# Patient Record
Sex: Male | Born: 1955
Health system: Southern US, Community
[De-identification: ages and names within clinical notes are randomized; demographics above are authoritative.]

## PROBLEM LIST (undated history)

## (undated) DIAGNOSIS — E162 Hypoglycemia, unspecified: Secondary | ICD-10-CM

## (undated) DIAGNOSIS — L509 Urticaria, unspecified: Secondary | ICD-10-CM

## (undated) DIAGNOSIS — I10 Essential (primary) hypertension: Secondary | ICD-10-CM

## (undated) DIAGNOSIS — F32A Depression, unspecified: Secondary | ICD-10-CM

## (undated) DIAGNOSIS — M1990 Unspecified osteoarthritis, unspecified site: Secondary | ICD-10-CM

## (undated) DIAGNOSIS — G709 Myoneural disorder, unspecified: Secondary | ICD-10-CM

## (undated) DIAGNOSIS — E785 Hyperlipidemia, unspecified: Secondary | ICD-10-CM

## (undated) DIAGNOSIS — K219 Gastro-esophageal reflux disease without esophagitis: Secondary | ICD-10-CM

## (undated) DIAGNOSIS — F419 Anxiety disorder, unspecified: Secondary | ICD-10-CM

## (undated) HISTORY — DX: Urticaria, unspecified: L50.9

## (undated) HISTORY — PX: TONSILLECTOMY AND ADENOIDECTOMY: SUR1326

## (undated) HISTORY — PX: SIGMOIDOSCOPY: SUR1295

## (undated) HISTORY — PX: HERNIA REPAIR: SHX51

## (undated) HISTORY — DX: Hyperlipidemia, unspecified: E78.5

## (undated) HISTORY — DX: Anxiety disorder, unspecified: F41.9

## (undated) HISTORY — PX: CARDIAC CATHETERIZATION: SHX172

## (undated) HISTORY — DX: Essential (primary) hypertension: I10

## (undated) HISTORY — DX: Gastro-esophageal reflux disease without esophagitis: K21.9

---

## 2004-06-16 HISTORY — PX: GLAUCOMA REPAIR: SHX214

## 2006-02-12 ENCOUNTER — Encounter: Admission: RE | Admit: 2006-02-12 | Discharge: 2006-02-12 | Payer: Self-pay | Admitting: Family Medicine

## 2006-11-02 ENCOUNTER — Emergency Department (HOSPITAL_COMMUNITY): Admission: EM | Admit: 2006-11-02 | Discharge: 2006-11-02 | Payer: Self-pay | Admitting: Emergency Medicine

## 2007-08-22 ENCOUNTER — Encounter: Admission: RE | Admit: 2007-08-22 | Discharge: 2007-08-22 | Payer: Self-pay | Admitting: Family Medicine

## 2010-07-07 ENCOUNTER — Encounter: Payer: Self-pay | Admitting: Family Medicine

## 2011-10-16 ENCOUNTER — Encounter (INDEPENDENT_AMBULATORY_CARE_PROVIDER_SITE_OTHER): Payer: Self-pay | Admitting: Surgery

## 2011-10-17 ENCOUNTER — Encounter (INDEPENDENT_AMBULATORY_CARE_PROVIDER_SITE_OTHER): Payer: Self-pay | Admitting: Surgery

## 2011-11-07 ENCOUNTER — Encounter (INDEPENDENT_AMBULATORY_CARE_PROVIDER_SITE_OTHER): Payer: Self-pay | Admitting: Surgery

## 2011-11-07 ENCOUNTER — Ambulatory Visit (INDEPENDENT_AMBULATORY_CARE_PROVIDER_SITE_OTHER): Payer: BC Managed Care – PPO | Admitting: Surgery

## 2011-11-07 VITALS — BP 140/72 | HR 92 | Temp 98.8°F | Resp 14 | Ht 68.0 in | Wt 165.4 lb

## 2011-11-07 DIAGNOSIS — K4090 Unilateral inguinal hernia, without obstruction or gangrene, not specified as recurrent: Secondary | ICD-10-CM | POA: Insufficient documentation

## 2011-11-07 NOTE — Patient Instructions (Signed)
Stop your aspirin 5 days before surgery. 

## 2011-11-07 NOTE — Progress Notes (Signed)
Patient ID: Sean Kirby, male   DOB: [birth year], 56 y.o.   MRN: [id number]  Chief Complaint  Patient presents with  . New Evaluation    New Pt.Eval [facility]    HPI Sean Kirby is a 56 y.o. male.  Referred by Dr. Johny Blamer for evaluation of left inguinal hernia. HPI   This is a 56 year old male who presents with a two-week history of some discomfort and bulging in his left groin. This causes some discomfort. He has avoided any heavy lifting. He denies any obstructive symptoms. He presents now for surgical evaluation. Past Medical History  Diagnosis Date  . Hypertension   . Hyperlipidemia   . Anxiety   . Diabetes mellitus   . Glaucoma   . GERD (gastroesophageal reflux disease)     Past Surgical History  Procedure Date  . Tonsillectomy and adenoidectomy   . Glaucoma repair 2006    Family History  Problem Relation Age of Onset  . Heart disease Mother   . Heart disease Maternal Aunt   . Heart disease Maternal Grandmother     Social History History  Substance Use Topics  . Smoking status: Never Smoker   . Smokeless tobacco: Never Used  . Alcohol Use: No    Allergies  Allergen Reactions  . Codeine   . Septra (Sulfamethoxazole W-Trimethoprim)   . Glimepiride   . Metformin And Related   . Simvastatin Diarrhea    Current Outpatient Prescriptions  Medication Sig Dispense Refill  . amLODipine (NORVASC) 10 MG tablet Take 10 mg by mouth daily.      Sean Kirby Kitchen aspirin 325 MG buffered tablet Take 325 mg by mouth as needed.      Sean Kirby Kitchen glucose blood test strip 1 each by Other route as needed. Use as instructed      . LORazepam (ATIVAN) 0.5 MG tablet Take 0.5 mg by mouth every 8 (eight) hours.      . pravastatin (PRAVACHOL) 20 MG tablet Take 20 mg by mouth daily.      . quinapril-hydrochlorothiazide (ACCURETIC) 20-12.5 MG per tablet Take 1 tablet by mouth daily.        Review of Systems Review of Systems  Constitutional: Negative for fever, chills and unexpected weight  change.  HENT: Negative for hearing loss, congestion, sore throat, trouble swallowing and voice change.   Eyes: Negative for visual disturbance.  Respiratory: Negative for cough and wheezing.   Cardiovascular: Negative for chest pain, palpitations and leg swelling.  Gastrointestinal: Negative for nausea, vomiting, abdominal pain, diarrhea, constipation, blood in stool, abdominal distention, anal bleeding and rectal pain.  Genitourinary: Negative for hematuria and difficulty urinating.  Musculoskeletal: Negative for arthralgias.  Skin: Negative for rash and wound.  Neurological: Negative for seizures, syncope, weakness and headaches.  Hematological: Negative for adenopathy. Does not bruise/bleed easily.  Psychiatric/Behavioral: Negative for confusion.    Blood pressure 140/72, pulse 92, temperature 98.8 F (37.1 C), temperature source Temporal, resp. rate 14, height 5\' 8"  (1.727 m), weight 165 lb 6.4 oz (75.025 kg).  Physical Exam Physical Exam WDWN in NAD HEENT:  EOMI, sclera anicteric Neck:  No masses, no thyromegaly Lungs:  CTA bilaterally; normal respiratory effort CV:  Regular rate and rhythm; no murmurs Abd:  +bowel sounds, soft, non-tender, no masses GU; bilateral descended testes; no testicular masses; reducible left inguinal hernia; no sign of right inguinal hernia Ext:  Well-perfused; no edema Skin:  Warm, dry; no sign of jaundice  Data Reviewed none  Assessment  Left inguinal hernia     Plan    Left inguinal hernia repair with mesh.  The surgical procedure has been discussed with the patient.  Potential risks, benefits, alternative treatments, and expected outcomes have been explained.  All of the patient's questions at this time have been answered.  The likelihood of reaching the patient's treatment goal is good.  The patient understand the proposed surgical procedure and wishes to proceed.        Roshun Klingensmith K. 11/07/2011, 6:05 PM

## 2011-11-27 ENCOUNTER — Encounter (HOSPITAL_BASED_OUTPATIENT_CLINIC_OR_DEPARTMENT_OTHER): Payer: Self-pay | Admitting: *Deleted

## 2011-11-27 NOTE — Progress Notes (Signed)
To come in for [id number]  

## 2011-11-28 ENCOUNTER — Encounter (HOSPITAL_BASED_OUTPATIENT_CLINIC_OR_DEPARTMENT_OTHER)
Admission: RE | Admit: 2011-11-28 | Discharge: 2011-11-28 | Disposition: A | Discharge status: Home / Self Care | Payer: BC Managed Care – PPO | Source: Ambulatory Visit | Attending: Surgery | Admitting: Surgery

## 2011-11-28 LAB — BASIC METABOLIC PANEL
BUN: 18 mg/dL (ref 6–23)
CO2: 27 mEq/L (ref 19–32)
Calcium: 9.8 mg/dL (ref 8.4–10.5)
Chloride: 101 mEq/L (ref 96–112)
Creatinine, Ser: 1.07 mg/dL (ref 0.50–1.35)
GFR calc Af Amer: 87 mL/min — ABNORMAL LOW (ref >90)
GFR calc non Af Amer: 75 mL/min — ABNORMAL LOW (ref >90)
Glucose, Bld: 273 mg/dL — ABNORMAL HIGH (ref 70–99)
Potassium: 4 mEq/L (ref 3.5–5.1)
Sodium: 139 mEq/L (ref 135–145)

## 2011-12-02 ENCOUNTER — Encounter (HOSPITAL_BASED_OUTPATIENT_CLINIC_OR_DEPARTMENT_OTHER)
Admission: RE | Disposition: A | Discharge status: Home / Self Care | Payer: Self-pay | Source: Ambulatory Visit | Attending: Surgery

## 2011-12-02 ENCOUNTER — Encounter (HOSPITAL_BASED_OUTPATIENT_CLINIC_OR_DEPARTMENT_OTHER): Payer: Self-pay | Admitting: Anesthesiology

## 2011-12-02 ENCOUNTER — Ambulatory Visit (HOSPITAL_BASED_OUTPATIENT_CLINIC_OR_DEPARTMENT_OTHER): Payer: BC Managed Care – PPO | Admitting: Anesthesiology

## 2011-12-02 ENCOUNTER — Ambulatory Visit (HOSPITAL_BASED_OUTPATIENT_CLINIC_OR_DEPARTMENT_OTHER)
Admission: RE | Admit: 2011-12-02 | Discharge: 2011-12-02 | Disposition: A | Discharge status: Home / Self Care | Payer: BC Managed Care – PPO | Source: Ambulatory Visit | Attending: Surgery | Admitting: Surgery

## 2011-12-02 ENCOUNTER — Encounter (HOSPITAL_BASED_OUTPATIENT_CLINIC_OR_DEPARTMENT_OTHER): Payer: Self-pay | Admitting: *Deleted

## 2011-12-02 DIAGNOSIS — E785 Hyperlipidemia, unspecified: Secondary | ICD-10-CM | POA: Insufficient documentation

## 2011-12-02 DIAGNOSIS — K4090 Unilateral inguinal hernia, without obstruction or gangrene, not specified as recurrent: Secondary | ICD-10-CM

## 2011-12-02 DIAGNOSIS — K219 Gastro-esophageal reflux disease without esophagitis: Secondary | ICD-10-CM | POA: Insufficient documentation

## 2011-12-02 DIAGNOSIS — M129 Arthropathy, unspecified: Secondary | ICD-10-CM | POA: Insufficient documentation

## 2011-12-02 DIAGNOSIS — F411 Generalized anxiety disorder: Secondary | ICD-10-CM | POA: Insufficient documentation

## 2011-12-02 DIAGNOSIS — E119 Type 2 diabetes mellitus without complications: Secondary | ICD-10-CM | POA: Insufficient documentation

## 2011-12-02 DIAGNOSIS — H409 Unspecified glaucoma: Secondary | ICD-10-CM | POA: Insufficient documentation

## 2011-12-02 DIAGNOSIS — I10 Essential (primary) hypertension: Secondary | ICD-10-CM | POA: Insufficient documentation

## 2011-12-02 DIAGNOSIS — Z01810 Encounter for preprocedural cardiovascular examination: Secondary | ICD-10-CM | POA: Insufficient documentation

## 2011-12-02 HISTORY — DX: Unspecified osteoarthritis, unspecified site: M19.90

## 2011-12-02 HISTORY — PX: INGUINAL HERNIA REPAIR: SHX194

## 2011-12-02 HISTORY — DX: Myoneural disorder, unspecified: G70.9

## 2011-12-02 HISTORY — DX: Hypoglycemia, unspecified: E16.2

## 2011-12-02 LAB — GLUCOSE, CAPILLARY
Glucose-Capillary: 121 mg/dL — ABNORMAL HIGH (ref 70–99)
Glucose-Capillary: 134 mg/dL — ABNORMAL HIGH (ref 70–99)

## 2011-12-02 SURGERY — REPAIR, HERNIA, INGUINAL, ADULT
Anesthesia: General | Site: Abdomen | Laterality: Left | Wound class: Clean

## 2011-12-02 MED ORDER — BUPIVACAINE-EPINEPHRINE 0.25% -1:200000 IJ SOLN
INTRAMUSCULAR | Status: DC | PRN
  Administered 2011-12-02: 20 mL

## 2011-12-02 MED ORDER — HYDROMORPHONE HCL PF 1 MG/ML IJ SOLN
0.2500 mg | INTRAMUSCULAR | Status: DC | PRN
  Administered 2011-12-02: 0.5 mg via INTRAVENOUS

## 2011-12-02 MED ORDER — MIDAZOLAM HCL 5 MG/5ML IJ SOLN
INTRAMUSCULAR | Status: DC | PRN
  Administered 2011-12-02 (×2): 1 mg via INTRAVENOUS

## 2011-12-02 MED ORDER — ONDANSETRON HCL 4 MG/2ML IJ SOLN: 4.0000 mg | INTRAMUSCULAR | Status: DC | PRN

## 2011-12-02 MED ORDER — LACTATED RINGERS IV SOLN
INTRAVENOUS | Status: DC
  Administered 2011-12-02 (×2): via INTRAVENOUS

## 2011-12-02 MED ORDER — MORPHINE SULFATE 2 MG/ML IJ SOLN: 2.0000 mg | INTRAMUSCULAR | Status: DC | PRN

## 2011-12-02 MED ORDER — DEXAMETHASONE SODIUM PHOSPHATE 4 MG/ML IJ SOLN
INTRAMUSCULAR | Status: DC | PRN
  Administered 2011-12-02: 4 mg via INTRAVENOUS

## 2011-12-02 MED ORDER — ONDANSETRON HCL 4 MG/2ML IJ SOLN: 4.0000 mg | Freq: Four times a day (QID) | INTRAMUSCULAR | Status: DC | PRN

## 2011-12-02 MED ORDER — PROPOFOL 10 MG/ML IV EMUL
INTRAVENOUS | Status: DC | PRN
  Administered 2011-12-02: 200 mg via INTRAVENOUS

## 2011-12-02 MED ORDER — OXYCODONE-ACETAMINOPHEN 5-325 MG PO TABS
1.0000 | ORAL_TABLET | ORAL | Status: DC | PRN
  Administered 2011-12-02: 1 via ORAL

## 2011-12-02 MED ORDER — EPHEDRINE SULFATE 50 MG/ML IJ SOLN
INTRAMUSCULAR | Status: DC | PRN
  Administered 2011-12-02 (×2): 10 mg via INTRAVENOUS

## 2011-12-02 MED ORDER — FENTANYL CITRATE 0.05 MG/ML IJ SOLN
INTRAMUSCULAR | Status: DC | PRN
  Administered 2011-12-02 (×2): 50 ug via INTRAVENOUS

## 2011-12-02 MED ORDER — OXYCODONE-ACETAMINOPHEN 5-325 MG PO TABS: 1.0000 | ORAL_TABLET | ORAL | Status: AC | PRN

## 2011-12-02 MED ORDER — LIDOCAINE HCL (CARDIAC) 20 MG/ML IV SOLN
INTRAVENOUS | Status: DC | PRN
  Administered 2011-12-02: 50 mg via INTRAVENOUS

## 2011-12-02 MED ORDER — CEFAZOLIN SODIUM-DEXTROSE 2-3 GM-% IV SOLR
2.0000 g | INTRAVENOUS | Status: AC
  Administered 2011-12-02: 2 g via INTRAVENOUS

## 2011-12-02 SURGICAL SUPPLY — 51 items
BENZOIN TINCTURE PRP APPL 2/3 (GAUZE/BANDAGES/DRESSINGS) ×2 IMPLANT
BLADE HEX COATED 2.75 (ELECTRODE) ×2 IMPLANT
BLADE SURG 15 STRL LF DISP TIS (BLADE) ×1 IMPLANT
BLADE SURG 15 STRL SS (BLADE) ×1
BLADE SURG ROTATE 9660 (MISCELLANEOUS) ×2 IMPLANT
CANISTER SUCTION 1200CC (MISCELLANEOUS) IMPLANT
CHLORAPREP W/TINT 26ML (MISCELLANEOUS) ×2 IMPLANT
CLOTH BEACON ORANGE TIMEOUT ST (SAFETY) ×2 IMPLANT
COVER MAYO STAND STRL (DRAPES) ×2 IMPLANT
COVER TABLE BACK 60X90 (DRAPES) ×2 IMPLANT
DECANTER SPIKE VIAL GLASS SM (MISCELLANEOUS) ×2 IMPLANT
DRAIN PENROSE 1/2X12 LTX STRL (WOUND CARE) ×2 IMPLANT
DRAPE LAPAROTOMY TRNSV 102X78 (DRAPE) ×2 IMPLANT
DRAPE UTILITY XL STRL (DRAPES) ×2 IMPLANT
DRSG TEGADERM 4X4.75 (GAUZE/BANDAGES/DRESSINGS) ×2 IMPLANT
ELECT REM PT RETURN 9FT ADLT (ELECTROSURGICAL) ×2
ELECTRODE REM PT RTRN 9FT ADLT (ELECTROSURGICAL) ×1 IMPLANT
GAUZE SPONGE 4X4 12PLY STRL LF (GAUZE/BANDAGES/DRESSINGS) ×2 IMPLANT
GLOVE BIO SURGEON STRL SZ7 (GLOVE) ×2 IMPLANT
GLOVE BIOGEL PI IND STRL 7.5 (GLOVE) ×1 IMPLANT
GLOVE BIOGEL PI INDICATOR 7.5 (GLOVE) ×1
GOWN PREVENTION PLUS XLARGE (GOWN DISPOSABLE) ×6 IMPLANT
MESH ULTRAPRO 3X6 7.6X15CM (Mesh General) ×2 IMPLANT
NEEDLE HYPO 25X1 1.5 SAFETY (NEEDLE) ×2 IMPLANT
NS IRRIG 1000ML POUR BTL (IV SOLUTION) ×2 IMPLANT
PACK BASIN DAY SURGERY FS (CUSTOM PROCEDURE TRAY) ×2 IMPLANT
PAIN PUMP ON-Q 100MLX2ML 2.5IN (PAIN MANAGEMENT) IMPLANT
PENCIL BUTTON HOLSTER BLD 10FT (ELECTRODE) ×2 IMPLANT
SLEEVE SCD COMPRESS KNEE MED (MISCELLANEOUS) ×2 IMPLANT
SPONGE INTESTINAL PEANUT (DISPOSABLE) ×2 IMPLANT
SPONGE LAP 18X18 X RAY DECT (DISPOSABLE) IMPLANT
SPONGE LAP 4X18 X RAY DECT (DISPOSABLE) IMPLANT
STRIP CLOSURE SKIN 1/2X4 (GAUZE/BANDAGES/DRESSINGS) ×2 IMPLANT
SUT MON AB 4-0 PC3 18 (SUTURE) ×2 IMPLANT
SUT PDS AB 0 CT 36 (SUTURE) IMPLANT
SUT PROLENE 2 0 SH DA (SUTURE) ×2 IMPLANT
SUT SILK 2 0 SH (SUTURE) IMPLANT
SUT SILK 3 0 SH 30 (SUTURE) IMPLANT
SUT SILK 3 0 TIES 17X18 (SUTURE)
SUT SILK 3-0 18XBRD TIE BLK (SUTURE) IMPLANT
SUT VIC AB 0 SH 27 (SUTURE) IMPLANT
SUT VIC AB 2-0 SH 27 (SUTURE) ×2
SUT VIC AB 2-0 SH 27XBRD (SUTURE) ×2 IMPLANT
SUT VIC AB 3-0 SH 27 (SUTURE) ×1
SUT VIC AB 3-0 SH 27X BRD (SUTURE) ×1 IMPLANT
SYR CONTROL 10ML LL (SYRINGE) ×2 IMPLANT
TOWEL OR 17X24 6PK STRL BLUE (TOWEL DISPOSABLE) ×2 IMPLANT
TOWEL OR NON WOVEN STRL DISP B (DISPOSABLE) ×2 IMPLANT
TUBE CONNECTING 20X1/4 (TUBING) IMPLANT
WATER STERILE IRR 1000ML POUR (IV SOLUTION) IMPLANT
YANKAUER SUCT BULB TIP NO VENT (SUCTIONS) IMPLANT

## 2011-12-02 NOTE — Discharge Instructions (Signed)
[facility] ° ° INGUINAL HERNIA REPAIR: POST OP INSTRUCTIONS ° °Always review your discharge instruction sheet given to you by the facility where your surgery was performed. °IF YOU HAVE DISABILITY OR FAMILY LEAVE FORMS, YOU MUST BRING THEM TO THE OFFICE FOR PROCESSING.   °DO NOT GIVE THEM TO YOUR DOCTOR. ° °1. A  prescription for pain medication may be given to you upon discharge.  Take your pain medication as prescribed, if needed.  If narcotic pain medicine is not needed, then you may take acetaminophen (Tylenol) or ibuprofen (Advil) as needed. °2. Take your usually prescribed medications unless otherwise directed. °3. If you need a refill on your pain medication, please contact your pharmacy.  They will contact our office to request authorization. Prescriptions will not be filled after 5 pm or on week-ends. °4. You should follow a light diet the first 24 hours after arrival home, such as soup and crackers, etc.  Be sure to include lots of fluids daily.  Resume your normal diet the day after surgery. °5. Most patients will experience some swelling and bruising around the umbilicus or in the groin and scrotum.  Ice packs and reclining will help.  Swelling and bruising can take several days to resolve.  °6. It is common to experience some constipation if taking pain medication after surgery.  Increasing fluid intake and taking a stool softener (such as Colace) will usually help or prevent this problem from occurring.  A mild laxative (Milk of Magnesia or Miralax) should be taken according to package directions if there are no bowel movements after 48 hours. °7. Unless discharge instructions indicate otherwise, you may remove your bandages 24-48 hours after surgery, and you may shower at that time.  You will have steri-strips (small skin tapes) in place directly over the incision.  These strips should be left on the skin for 7-10 days. °8. ACTIVITIES:  You may resume regular (light) daily activities  beginning the next day--such as daily self-care, walking, climbing stairs--gradually increasing activities as tolerated.  You may have sexual intercourse when it is comfortable.  Refrain from any heavy lifting or straining until approved by your doctor. °a. You may drive when you are no longer taking prescription pain medication, you can comfortably wear a seatbelt, and you can safely maneuver your car and apply brakes. °b. RETURN TO WORK:  2-3 weeks with light duty - no lifting over 15 lbs. °9. You should see your doctor in the office for a follow-up appointment approximately 2-3 weeks after your surgery.  Make sure that you call for this appointment within a day or two after you arrive home to insure a convenient appointment time. °10. OTHER INSTRUCTIONS:  __________________________________________________________________________________________________________________________________________________________________________________________  °WHEN TO CALL YOUR DOCTOR: °1. Fever over 101.0 °2. Inability to urinate °3. Nausea and/or vomiting °4. Extreme swelling or bruising °5. Continued bleeding from incision. °6. Increased pain, redness, or drainage from the incision ° °The clinic staff is available to answer your questions during regular business hours.  Please don’t hesitate to call and ask to speak to one of the nurses for clinical concerns.  If you have a medical emergency, go to the nearest emergency room or call 911.  A surgeon from [facility] is always on call at the hospital ° ° °[address], [address], [place], [place]  [address] ? ° [address] [address], [place],    [address] °[phone]    [phone]    FAX [phone] °Web site:   [email or web address] ° ° °Post Anesthesia Home Care Instructions ° °Activity: °Get plenty of rest for the remainder of the day. A responsible adult should stay with you for 24 hours following the procedure.  °For the next 24 hours, DO  NOT: °-Drive a car °-Operate machinery °-Drink alcoholic beverages °-Take any medication unless instructed by your physician °-Make any legal decisions or sign important papers. ° °Meals: °Start with liquid foods such as gelatin or soup. Progress to regular foods as tolerated. Avoid greasy, spicy, heavy foods. If nausea and/or vomiting occur, drink only clear liquids until the nausea and/or vomiting subsides. Call your physician if vomiting continues. ° °Special Instructions/Symptoms: °Your throat may feel dry or sore from the anesthesia or the breathing tube placed in your throat during surgery. If this causes discomfort, gargle with warm salt water. The discomfort should disappear within 24 hours. ° °

## 2011-12-02 NOTE — Anesthesia Procedure Notes (Signed)
Procedure Name: LMA Insertion Date/Time: 12/02/2011 8:56 AM Performed by: Caren Macadam Pre-anesthesia Checklist: Patient identified, Emergency Drugs available, Suction available and Patient being monitored Patient Re-evaluated:Patient Re-evaluated prior to inductionOxygen Delivery Method: Circle System Utilized Preoxygenation: Pre-oxygenation with 100% oxygen Intubation Type: IV induction Ventilation: Mask ventilation without difficulty LMA: LMA inserted LMA Size: 4.0 Number of attempts: 1 Airway Equipment and Method: bite block Placement Confirmation: positive ETCO2 and breath sounds checked- equal and bilateral Tube secured with: Tape Dental Injury: Teeth and Oropharynx as per pre-operative assessment

## 2011-12-02 NOTE — Anesthesia Preprocedure Evaluation (Signed)
Anesthesia Evaluation  Patient identified by MRN, date of birth, ID band Patient awake    Reviewed: Allergy & Precautions, H&P , NPO status , Patient's Chart, lab work & pertinent test results  Airway Mallampati: II  Neck ROM: full    Dental   Pulmonary          Cardiovascular hypertension,     Neuro/Psych Anxiety  Neuromuscular disease    GI/Hepatic GERD-  ,  Endo/Other  Diabetes mellitus-  Renal/GU      Musculoskeletal  (+) Arthritis -,   Abdominal   Peds  Hematology   Anesthesia Other Findings   Reproductive/Obstetrics                           Anesthesia Physical Anesthesia Plan  ASA: II  Anesthesia Plan: General   Post-op Pain Management:    Induction: Intravenous  Airway Management Planned: LMA  Additional Equipment:   Intra-op Plan:   Post-operative Plan:   Informed Consent: I have reviewed the patients History and Physical, chart, labs and discussed the procedure including the risks, benefits and alternatives for the proposed anesthesia with the patient or authorized representative who has indicated his/her understanding and acceptance.     Plan Discussed with: CRNA and Surgeon  Anesthesia Plan Comments:         Anesthesia Quick Evaluation

## 2011-12-02 NOTE — Transfer of Care (Signed)
Immediate Anesthesia Transfer of Care Note  Patient: Sean Kirby  Procedure(s) Performed: Procedure(s) (LRB): HERNIA REPAIR INGUINAL ADULT (Left) INSERTION OF MESH (Left)  Patient Location: PACU  Anesthesia Type: General  Level of Consciousness: sedated  Airway & Oxygen Therapy: Patient Spontanous Breathing and Patient connected to face mask oxygen  Post-op Assessment: Report given to PACU RN and Post -op Vital signs reviewed and stable  Post vital signs: Reviewed and stable  Complications: No apparent anesthesia complications

## 2011-12-02 NOTE — Interval H&P Note (Signed)
History and Physical Interval Note:  12/02/2011 7:38 AM  Sean Kirby  has presented today for surgery, with the diagnosis of LEFT INGUINAL HERNIA   The various methods of treatment have been discussed with the patient and family. After consideration of risks, benefits and other options for treatment, the patient has consented to  Procedure(s) (LRB): HERNIA REPAIR INGUINAL ADULT (Left) INSERTION OF MESH (Left) as a surgical intervention .  The patient's history has been reviewed, patient examined, no change in status, stable for surgery.  I have reviewed the patients' chart and labs.  Questions were answered to the patient's satisfaction.     Sean Eldridge K.

## 2011-12-02 NOTE — H&P (View-Only) (Signed)
Patient ID: Sean Kirby, male   DOB: 03/03/1956, 55 y.o.   MRN: [id number]  Chief Complaint  Patient presents with  . New Evaluation    New Pt.Eval [facility]    HPI Sean Kirby is a 55 y.o. male.  Referred by Dr. William Harris for evaluation of left inguinal hernia. HPI   [name] who presents with a two-week history of some discomfort and bulging in his left groin. This causes some discomfort. He has avoided any heavy lifting. He denies any obstructive symptoms. He presents now for surgical evaluation. Past Medical History  Diagnosis Date  . Hypertension   . Hyperlipidemia   . Anxiety   . Diabetes mellitus   . Glaucoma   . GERD (gastroesophageal reflux disease)     Past Surgical History  Procedure Date  . Tonsillectomy and adenoidectomy   . Glaucoma repair 2006    Family History  Problem Relation Age of Onset  . Heart disease Mother   . Heart disease Maternal Aunt   . Heart disease Maternal Grandmother     Social History History  Substance Use Topics  . Smoking status: Never Smoker   . Smokeless tobacco: Never Used  . Alcohol Use: No    Allergies  Allergen Reactions  . Codeine   . Septra (Sulfamethoxazole W-Trimethoprim)   . Glimepiride   . Metformin And Related   . Simvastatin Diarrhea    Current Outpatient Prescriptions  Medication Sig Dispense Refill  . amLODipine (NORVASC) 10 MG tablet Take 10 mg by mouth daily.      . aspirin 325 MG buffered tablet Take 325 mg by mouth as needed.      . glucose blood test strip 1 each by Other route as needed. Use as instructed      . LORazepam (ATIVAN) 0.5 MG tablet Take 0.5 mg by mouth every 8 (eight) hours.      . pravastatin (PRAVACHOL) 20 MG tablet Take 20 mg by mouth daily.      . quinapril-hydrochlorothiazide (ACCURETIC) 20-12.5 MG per tablet Take 1 tablet by mouth daily.        Review of Systems Review of Systems  Constitutional: Negative for fever, chills and unexpected weight  change.  HENT: Negative for hearing loss, congestion, sore throat, trouble swallowing and voice change.   Eyes: Negative for visual disturbance.  Respiratory: Negative for cough and wheezing.   Cardiovascular: Negative for chest pain, palpitations and leg swelling.  Gastrointestinal: Negative for nausea, vomiting, abdominal pain, diarrhea, constipation, blood in stool, abdominal distention, anal bleeding and rectal pain.  Genitourinary: Negative for hematuria and difficulty urinating.  Musculoskeletal: Negative for arthralgias.  Skin: Negative for rash and wound.  Neurological: Negative for seizures, syncope, weakness and headaches.  Hematological: Negative for adenopathy. Does not bruise/bleed easily.  Psychiatric/Behavioral: Negative for confusion.    Blood pressure 140/72, pulse 92, temperature 98.8 F (37.1 C), temperature source Temporal, resp. rate 14, height 5' 8" (1.727 m), weight 165 lb 6.4 oz (75.025 kg).  Physical Exam Physical Exam WDWN in NAD HEENT:  EOMI, sclera anicteric Neck:  No masses, no thyromegaly Lungs:  CTA bilaterally; normal respiratory effort CV:  Regular rate and rhythm; no murmurs Abd:  +bowel sounds, soft, non-tender, no masses GU; bilateral descended testes; no testicular masses; reducible left inguinal hernia; no sign of right inguinal hernia Ext:  Well-perfused; no edema Skin:  Warm, dry; no sign of jaundice  Data Reviewed none  Assessment      Left inguinal hernia     Plan    Left inguinal hernia repair with mesh.  The surgical procedure has been discussed with the patient.  Potential risks, benefits, alternative treatments, and expected outcomes have been explained.  All of the patient's questions at this time have been answered.  The likelihood of reaching the patient's treatment goal is good.  The patient understand the proposed surgical procedure and wishes to proceed.        Sean Kirby K. 11/07/2011, 6:05 PM    

## 2011-12-02 NOTE — Progress Notes (Signed)
Oral Pharyngeal airway removed at 1000 hrs.  Pt tolerated well, NAD

## 2011-12-02 NOTE — Op Note (Signed)
Hernia, Open, Procedure Note  Indications: The patient presented with a history of a left, reducible inguinal hernia.    Pre-operative Diagnosis: left reducible inguinal hernia Post-operative Diagnosis: same  Surgeon: Wynona Luna.   Assistants: none  Anesthesia: General LMA anesthesia  ASA Class: 2  Procedure Details  The patient was seen again in the Holding Room. The risks, benefits, complications, treatment options, and expected outcomes were discussed with the patient. The possibilities of reaction to medication, pulmonary aspiration, perforation of viscus, bleeding, recurrent infection, the need for additional procedures, and development of a complication requiring transfusion or further operation were discussed with the patient and/or family. The likelihood of success in repairing the hernia and returning the patient to their previous functional status is good.  There was concurrence with the proposed plan, and informed consent was obtained. The site of surgery was properly noted/marked. The patient was taken to the Operating Room, identified as Sean Kirby, and the procedure verified as left inguinal hernia repair. A Time Out was held and the above information confirmed.  The patient was placed in the supine position and underwent induction of anesthesia. The lower abdomen and groin was prepped with Chloraprep and draped in the standard fashion, and 0.5% Marcaine with epinephrine was used to anesthetize the skin over the mid-portion of the inguinal canal. An oblique incision was made. Dissection was carried down through the subcutaneous tissue with cautery to the external oblique fascia.  We opened the external oblique fascia along the direction of its fibers to the external ring.  The spermatic cord was circumferentially dissected bluntly and retracted with a Penrose drain.  The floor of the inguinal canal was inspected and was lax, but no direct defect was noted.  We  skeletonized the spermatic cord and reduced a moderate-sized indirect hernia sac and a small cord lipoma.  The internal ring was tightened with a 2-0 Vicryl.  We used a 3 x 6 inch piece of Ultrapro mesh, which was cut into a keyhole shape.  This was secured with 2-0 Prolene, beginning at the pubic tubercle, running this along the internal oblique fascia superiorly and the shelving edge inferiorly.  The tails of the mesh were sutured together behind the spermatic cord.  The mesh was tucked underneath the external oblique fascia laterally.  The external oblique fascia was reapproximated with 2-0 Vicryl.  3-0 Vicryl was used to close the subcutaneous tissues and 4-0 Monocryl was used to close the skin in subcuticular fashion.  Benzoin and steri-strips were used to seal the incision.  A clean dressing was applied.  The patient was then extubated and brought to the recovery room in stable condition.  All sponge, instrument, and needle counts were correct prior to closure and at the conclusion of the case.   Estimated Blood Loss: Minimal                 Complications: None; patient tolerated the procedure well.         Disposition: PACU - hemodynamically stable.         Condition: stable  Wilmon Arms. Corliss Skains, MD, [facility]  12/02/2011 9:54 AM

## 2011-12-02 NOTE — Anesthesia Postprocedure Evaluation (Signed)
Anesthesia Post Note  Patient: Sean Kirby  Procedure(s) Performed: Procedure(s) (LRB): HERNIA REPAIR INGUINAL ADULT (Left) INSERTION OF MESH (Left)  Anesthesia type: General  Patient location: PACU  Post pain: Pain level controlled and Adequate analgesia  Post assessment: Post-op Vital signs reviewed, Patient's Cardiovascular Status Stable, Respiratory Function Stable, Patent Airway and Pain level controlled  Last Vitals:  Filed Vitals:   12/02/11 1100  BP: 137/73  Pulse: 100  Temp:   Resp: 17    Post vital signs: Reviewed and stable  Level of consciousness: awake, alert  and oriented  Complications: No apparent anesthesia complications

## 2011-12-03 ENCOUNTER — Encounter (HOSPITAL_BASED_OUTPATIENT_CLINIC_OR_DEPARTMENT_OTHER): Payer: Self-pay | Admitting: Surgery

## 2011-12-05 LAB — POCT HEMOGLOBIN-HEMACUE: Hemoglobin: 16.4 g/dL (ref 13.0–17.0)

## 2011-12-12 ENCOUNTER — Telehealth (INDEPENDENT_AMBULATORY_CARE_PROVIDER_SITE_OTHER): Payer: Self-pay | Admitting: General Surgery

## 2011-12-12 NOTE — Telephone Encounter (Signed)
Pt calling with generalized questions about his postop status.  He notes increased swelling at incision, relieved by resting off his feet.  Pain is well-controlled, taking narcotic only at bedtime.  Suggested he use ice to the site and take ibuprofen for its anti-inflammatory effects.  He furthermore reports the steri-strips are grossly intact to the surgical incision.

## 2011-12-16 ENCOUNTER — Ambulatory Visit (INDEPENDENT_AMBULATORY_CARE_PROVIDER_SITE_OTHER): Payer: BC Managed Care – PPO | Admitting: Surgery

## 2011-12-16 ENCOUNTER — Encounter (INDEPENDENT_AMBULATORY_CARE_PROVIDER_SITE_OTHER): Payer: Self-pay | Admitting: Surgery

## 2011-12-16 VITALS — BP 127/71 | HR 85 | Temp 98.5°F | Ht 68.0 in | Wt 163.0 lb

## 2011-12-16 DIAGNOSIS — K4090 Unilateral inguinal hernia, without obstruction or gangrene, not specified as recurrent: Secondary | ICD-10-CM

## 2011-12-16 NOTE — Progress Notes (Signed)
Status post left inguinal hernia repair with mesh on 12/02/11 for a large indirect hernia. The patient is doing well but he reports a lot of swelling under the incision. Pain is improving. Appetite and bowel movements are normal.  On examination his incision is healed with no sign of infection. There is a moderate sized seroma underneath the incision. No sign of recurrent hernia.  We prepped the skin with alcohol and anesthetized with 1% lidocaine. I inserted an 18-gauge needle and aspirated about 40 cc of clear yellow fluid with complete resolution of the seroma.  The patient will followup with [place] p.r.n. if the seroma reaccumulate swerve he has any other problems. Otherwise he may begin increasing his level of activity.  Wilmon Arms. Corliss Skains, MD, [facility]  12/16/2011 10:04 AM

## 2011-12-22 ENCOUNTER — Encounter (INDEPENDENT_AMBULATORY_CARE_PROVIDER_SITE_OTHER): Payer: Self-pay | Admitting: General Surgery

## 2011-12-22 ENCOUNTER — Ambulatory Visit (INDEPENDENT_AMBULATORY_CARE_PROVIDER_SITE_OTHER): Payer: BC Managed Care – PPO | Admitting: General Surgery

## 2011-12-22 VITALS — BP 116/74 | HR 70 | Temp 97.8°F | Resp 14 | Ht 68.0 in | Wt 165.0 lb

## 2011-12-22 DIAGNOSIS — IMO0002 Reserved for concepts with insufficient information to code with codable children: Secondary | ICD-10-CM

## 2011-12-22 DIAGNOSIS — Z09 Encounter for follow-up examination after completed treatment for conditions other than malignant neoplasm: Secondary | ICD-10-CM

## 2011-12-22 NOTE — Progress Notes (Signed)
Subjective:     Patient ID: Sean Kirby, male   DOB: 1956/06/15, 56 y.o.   MRN: [id number]  HPI 18 yom s/p lih by Dr. Corliss Skains who had left groin seroma drained last week.  Has recurrent left groin mass that is somewhat uncomfortable.  No fevers or other problems.  Review of Systems     Objective:   Physical Exam Left groin incision healing without infection, fairly significant seroma    Assessment:     S/p LIH Seroma     Plan:     We discussed drainage again.  I cleansed area and evacuated 16 cc of clear yellow fluid.  Gauze placed.  Will have him follow up with Dr. Corliss Skains in 1-2 weeks. May need again.

## 2012-01-08 ENCOUNTER — Encounter (INDEPENDENT_AMBULATORY_CARE_PROVIDER_SITE_OTHER): Payer: Self-pay | Admitting: Surgery

## 2012-01-08 ENCOUNTER — Ambulatory Visit (INDEPENDENT_AMBULATORY_CARE_PROVIDER_SITE_OTHER): Payer: BC Managed Care – PPO | Admitting: Surgery

## 2012-01-08 VITALS — BP 124/70 | HR 85 | Temp 98.0°F | Ht 68.0 in | Wt 163.4 lb

## 2012-01-08 DIAGNOSIS — K4090 Unilateral inguinal hernia, without obstruction or gangrene, not specified as recurrent: Secondary | ICD-10-CM

## 2012-01-08 NOTE — Progress Notes (Signed)
The patient has reaccumulated a small amount of seroma under his incision. He had this drained 2 weeks ago by Dr. Dwain Sarna. At that time they drained 16 mL of fluid. The current seroma seems to be relatively small. We prepped with alcohol and anesthetized with 1% lidocaine. I aspirated 6 mL of serosanguineous fluid. The incision is relatively flat. No sign of recurrent hernia. No sign of infection. The patient should slowly begin increasing his level of activity over the next 2 weeks. Follow up p.r.n.  Sean Kirby. Corliss Skains, MD, [facility]  01/08/2012 12:57 PM

## 2012-07-08 ENCOUNTER — Encounter (HOSPITAL_COMMUNITY): Payer: Self-pay

## 2012-07-08 ENCOUNTER — Emergency Department (HOSPITAL_COMMUNITY)
Admission: EM | Admit: 2012-07-08 | Discharge: 2012-07-08 | Disposition: A | Discharge status: Home / Self Care | Payer: BC Managed Care – PPO | Attending: Emergency Medicine | Admitting: Emergency Medicine

## 2012-07-08 ENCOUNTER — Emergency Department (HOSPITAL_COMMUNITY): Payer: BC Managed Care – PPO

## 2012-07-08 DIAGNOSIS — E1169 Type 2 diabetes mellitus with other specified complication: Secondary | ICD-10-CM | POA: Insufficient documentation

## 2012-07-08 DIAGNOSIS — Z8669 Personal history of other diseases of the nervous system and sense organs: Secondary | ICD-10-CM | POA: Insufficient documentation

## 2012-07-08 DIAGNOSIS — R110 Nausea: Secondary | ICD-10-CM | POA: Insufficient documentation

## 2012-07-08 DIAGNOSIS — Z7982 Long term (current) use of aspirin: Secondary | ICD-10-CM | POA: Insufficient documentation

## 2012-07-08 DIAGNOSIS — I10 Essential (primary) hypertension: Secondary | ICD-10-CM | POA: Insufficient documentation

## 2012-07-08 DIAGNOSIS — E785 Hyperlipidemia, unspecified: Secondary | ICD-10-CM | POA: Insufficient documentation

## 2012-07-08 DIAGNOSIS — Z8719 Personal history of other diseases of the digestive system: Secondary | ICD-10-CM | POA: Insufficient documentation

## 2012-07-08 DIAGNOSIS — Z8739 Personal history of other diseases of the musculoskeletal system and connective tissue: Secondary | ICD-10-CM | POA: Insufficient documentation

## 2012-07-08 DIAGNOSIS — R739 Hyperglycemia, unspecified: Secondary | ICD-10-CM

## 2012-07-08 DIAGNOSIS — Z79899 Other long term (current) drug therapy: Secondary | ICD-10-CM | POA: Insufficient documentation

## 2012-07-08 DIAGNOSIS — F411 Generalized anxiety disorder: Secondary | ICD-10-CM | POA: Insufficient documentation

## 2012-07-08 DIAGNOSIS — R002 Palpitations: Secondary | ICD-10-CM

## 2012-07-08 LAB — COMPREHENSIVE METABOLIC PANEL
ALT: 27 U/L (ref 0–53)
AST: 26 U/L (ref 0–37)
Albumin: 4.2 g/dL (ref 3.5–5.2)
Alkaline Phosphatase: 69 U/L (ref 39–117)
BUN: 19 mg/dL (ref 6–23)
CO2: 27 mEq/L (ref 19–32)
Calcium: 9.2 mg/dL (ref 8.4–10.5)
Chloride: 103 mEq/L (ref 96–112)
Creatinine, Ser: 0.98 mg/dL (ref 0.50–1.35)
GFR calc Af Amer: >90 mL/min (ref >90)
GFR calc non Af Amer: 89 mL/min — ABNORMAL LOW (ref >90)
Glucose, Bld: 179 mg/dL — ABNORMAL HIGH (ref 70–99)
Potassium: 4.2 mEq/L (ref 3.5–5.1)
Sodium: 141 mEq/L (ref 135–145)
Total Bilirubin: 0.6 mg/dL (ref 0.3–1.2)
Total Protein: 7.1 g/dL (ref 6.0–8.3)

## 2012-07-08 LAB — CBC WITH DIFFERENTIAL/PLATELET
Basophils Absolute: 0 10*3/uL (ref 0.0–0.1)
Basophils Relative: 0 % (ref 0–1)
Eosinophils Absolute: 0 10*3/uL (ref 0.0–0.7)
Eosinophils Relative: 0 % (ref 0–5)
HCT: 44.6 % (ref 39.0–52.0)
Hemoglobin: 16.4 g/dL (ref 13.0–17.0)
Lymphocytes Relative: 2 % — ABNORMAL LOW (ref 12–46)
Lymphs Abs: 0.3 10*3/uL — ABNORMAL LOW (ref 0.7–4.0)
MCH: 33.3 pg (ref 26.0–34.0)
MCHC: 36.8 g/dL — ABNORMAL HIGH (ref 30.0–36.0)
MCV: 90.5 fL (ref 78.0–100.0)
Monocytes Absolute: 0.5 10*3/uL (ref 0.1–1.0)
Monocytes Relative: 4 % (ref 3–12)
Neutro Abs: 13.1 10*3/uL — ABNORMAL HIGH (ref 1.7–7.7)
Neutrophils Relative %: 94 % — ABNORMAL HIGH (ref 43–77)
Platelets: 181 10*3/uL (ref 150–400)
RBC: 4.93 MIL/uL (ref 4.22–5.81)
RDW: 12.1 % (ref 11.5–15.5)
WBC: 13.9 10*3/uL — ABNORMAL HIGH (ref 4.0–10.5)

## 2012-07-08 LAB — URINALYSIS, ROUTINE W REFLEX MICROSCOPIC
Bilirubin Urine: NEGATIVE
Glucose, UA: NEGATIVE mg/dL
Hgb urine dipstick: NEGATIVE
Ketones, ur: >80 mg/dL — AB
Leukocytes, UA: NEGATIVE
Nitrite: NEGATIVE
Protein, ur: NEGATIVE mg/dL
Specific Gravity, Urine: 1.022 (ref 1.005–1.030)
Urobilinogen, UA: 0.2 mg/dL (ref 0.0–1.0)
pH: 8 (ref 5.0–8.0)

## 2012-07-08 LAB — LIPASE, BLOOD: Lipase: 34 U/L (ref 11–59)

## 2012-07-08 LAB — TROPONIN I: Troponin I: <0.3 ng/mL (ref <0.30)

## 2012-07-08 MED ORDER — METOPROLOL TARTRATE 1 MG/ML IV SOLN
5.0000 mg | Freq: Once | INTRAVENOUS | Status: AC
  Administered 2012-07-08: 5 mg via INTRAVENOUS
  Filled 2012-07-08: qty 5

## 2012-07-08 MED ORDER — ASPIRIN 81 MG PO CHEW
324.0000 mg | CHEWABLE_TABLET | Freq: Once | ORAL | Status: AC
  Administered 2012-07-08: 324 mg via ORAL
  Filled 2012-07-08: qty 4

## 2012-07-08 MED ORDER — SODIUM CHLORIDE 0.9 % IV SOLN
INTRAVENOUS | Status: DC
  Administered 2012-07-08: 09:00:00 via INTRAVENOUS

## 2012-07-08 MED ORDER — METOPROLOL TARTRATE 50 MG PO TABS: 25.0000 mg | ORAL_TABLET | Freq: Two times a day (BID) | ORAL | Status: DC

## 2012-07-08 NOTE — ED Notes (Signed)
Care transferred and report given to Juli, RN 

## 2012-07-08 NOTE — ED Notes (Signed)
Patient aware that we need urine sample.  Patient claims cannot give at this time.

## 2012-07-08 NOTE — ED Provider Notes (Signed)
History     CSN: [id number]  Arrival date & time 07/08/12  0815   First MD Initiated Contact with Patient 07/08/12 [id number]      Chief Complaint  Patient presents with  . Palpitations    (Consider location/radiation/quality/duration/timing/severity/associated sxs/prior treatment) Patient is a 57 y.o. male presenting with palpitations. The history is provided by the patient and medical records. No language interpreter was used.  Palpitations  This is a recurrent (The patient says he woke up at 3 AM rapid heartbeat. He has a history of this, but today it didn't slow down.) problem. The current episode started 6 to 12 hours ago. The problem occurs constantly. The problem has not changed since onset.Associated with: Nothing. On average, each episode lasts 6 hours. Associated symptoms include nausea. He has tried nothing for the symptoms. Risk factors include diabetes mellitus.    Past Medical History  Diagnosis Date  . Hypertension   . Hyperlipidemia   . Anxiety   . Diabetes mellitus   . Glaucoma(365)   . GERD (gastroesophageal reflux disease)   . Arthritis   . Neuromuscular disorder     neuropathy feet  . Hypoglycemia     Past Surgical History  Procedure Date  . Glaucoma repair 2006    lazer-low angle -both  . Tonsillectomy and adenoidectomy   . Sigmoidoscopy   . Inguinal hernia repair 12/02/2011    Procedure: HERNIA REPAIR INGUINAL ADULT;  Surgeon: Wilmon Arms. Corliss Skains, MD;  Location: [facility];  Service: General;  Laterality: Left;  left inguinal  . Hernia repair     Family History  Problem Relation Age of Onset  . Heart disease Mother   . Heart disease Maternal Aunt   . Heart disease Maternal Grandmother     History  Substance Use Topics  . Smoking status: Never Smoker   . Smokeless tobacco: Never Used  . Alcohol Use: No      Review of Systems  Constitutional: Negative.   HENT: Negative.   Eyes: Negative.   Respiratory: Negative.     Cardiovascular: Positive for palpitations.  Gastrointestinal: Positive for nausea.  Genitourinary: Negative.   Musculoskeletal: Negative.   Skin: Negative.   Neurological: Negative.   Psychiatric/Behavioral: Negative.     Allergies  Codeine; Septra; Glimepiride; Metformin and related; and Simvastatin  Home Medications   Current Outpatient Rx  Name  Route  Sig  Dispense  Refill  . AMLODIPINE BESYLATE 10 MG PO TABS   Oral   Take 10 mg by mouth daily.         . ASPIRIN BUFFERED 325 MG PO TABS   Oral   Take 325 mg by mouth daily.          Marland Kitchen BIMATOPROST 0.01 % OP SOLN   Both Eyes   Place 1 drop into both eyes at bedtime.         Marland Kitchen GLUCOSE BLOOD VI STRP   Other   1 each by Other route as needed. Use as instructed         . LORAZEPAM 0.5 MG PO TABS   Oral   Take 0.5 mg by mouth 2 (two) times daily.          . ADULT MULTIVITAMIN W/MINERALS CH   Oral   Take 1 tablet by mouth daily.         Marland Kitchen PRAVASTATIN SODIUM 20 MG PO TABS   Oral   Take 20 mg by mouth daily.         Marland Kitchen  QUINAPRIL-HYDROCHLOROTHIAZIDE 20-12.5 MG PO TABS   Oral   Take 1 tablet by mouth daily.           BP 136/82  Pulse 122  Temp 98.8 F (37.1 C) (Oral)  Resp 21  SpO2 99%  Physical Exam  Nursing note and vitals reviewed. Constitutional: He appears well-developed. No distress.  HENT:  Head: Normocephalic and atraumatic.  Right Ear: External ear normal.  Mouth/Throat: Oropharynx is clear and moist.  Eyes: Conjunctivae normal and EOM are normal. Pupils are equal, round, and reactive to light.  Neck: Normal range of motion. Neck supple.  Cardiovascular: Regular rhythm and normal heart sounds.        Heart rate 118 on monitor when I was seeing him.  Pulmonary/Chest: Effort normal and breath sounds normal.  Abdominal: Soft. Bowel sounds are normal. He exhibits no distension. There is no tenderness.       He localizes mild pain and nausea to his epigastric region, but there is no  mass or tenderness there.  Musculoskeletal: Normal range of motion. He exhibits no edema and no tenderness.  Neurological: He is alert.       No sensory or motor deficit.  Skin: Skin is warm and dry.  Psychiatric: He has a normal mood and affect. His behavior is normal.    ED Course  Procedures (including critical care time)  8:42 AM  Date: 07/08/2012  Rate: 118  Rhythm: sinus tachycardia  QRS Axis: normal  Intervals: normal PQRS:  Left atrial abnormality  ST/T Wave abnormalities: normal  Conduction Disutrbances:none  Narrative Interpretation: Borderline EKG  Old EKG Reviewed: unchanged  9:03 AM Pt seen --> physical exam performed.  Lab workup ordered.  IV metoprolol ordered.  12:33 PM Results for orders placed during the hospital encounter of 07/08/12  CBC WITH DIFFERENTIAL      Component Value Range   WBC 13.9 (*) 4.0 - 10.5 K/uL   RBC 4.93  4.22 - 5.81 MIL/uL   Hemoglobin 16.4  13.0 - 17.0 g/dL   HCT 16.1  09.6 - 04.5 %   MCV 90.5  78.0 - 100.0 fL   MCH 33.3  26.0 - 34.0 pg   MCHC 36.8 (*) 30.0 - 36.0 g/dL   RDW 40.9  81.1 - 91.4 %   Platelets 181  150 - 400 K/uL   Neutrophils Relative 94 (*) 43 - 77 %   Neutro Abs 13.1 (*) 1.7 - 7.7 K/uL   Lymphocytes Relative 2 (*) 12 - 46 %   Lymphs Abs 0.3 (*) 0.7 - 4.0 K/uL   Monocytes Relative 4  3 - 12 %   Monocytes Absolute 0.5  0.1 - 1.0 K/uL   Eosinophils Relative 0  0 - 5 %   Eosinophils Absolute 0.0  0.0 - 0.7 K/uL   Basophils Relative 0  0 - 1 %   Basophils Absolute 0.0  0.0 - 0.1 K/uL  COMPREHENSIVE METABOLIC PANEL      Component Value Range   Sodium 141  135 - 145 mEq/L   Potassium 4.2  3.5 - 5.1 mEq/L   Chloride 103  96 - 112 mEq/L   CO2 27  19 - 32 mEq/L   Glucose, Bld 179 (*) 70 - 99 mg/dL   BUN 19  6 - 23 mg/dL   Creatinine, Ser 7.82  0.50 - 1.35 mg/dL   Calcium 9.2  8.4 - 95.6 mg/dL   Total Protein 7.1  6.0 - 8.3 g/dL  Albumin 4.2  3.5 - 5.2 g/dL   AST 26  0 - 37 U/L   ALT 27  0 - 53 U/L    Alkaline Phosphatase 69  39 - 117 U/L   Total Bilirubin 0.6  0.3 - 1.2 mg/dL   GFR calc non Af Amer 89 (*) >90 mL/min   GFR calc Af Amer >90  >90 mL/min  LIPASE, BLOOD      Component Value Range   Lipase 34  11 - 59 U/L  URINALYSIS, ROUTINE W REFLEX MICROSCOPIC      Component Value Range   Color, Urine YELLOW  YELLOW   APPearance CLOUDY (*) CLEAR   Specific Gravity, Urine 1.022  1.005 - 1.030   pH 8.0  5.0 - 8.0   Glucose, UA NEGATIVE  NEGATIVE mg/dL   Hgb urine dipstick NEGATIVE  NEGATIVE   Bilirubin Urine NEGATIVE  NEGATIVE   Ketones, ur >80 (*) NEGATIVE mg/dL   Protein, ur NEGATIVE  NEGATIVE mg/dL   Urobilinogen, UA 0.2  0.0 - 1.0 mg/dL   Nitrite NEGATIVE  NEGATIVE   Leukocytes, UA NEGATIVE  NEGATIVE  TROPONIN I      Component Value Range   Troponin I <0.30  <0.30 ng/mL   Dg Chest Port 1 View  07/08/2012  *RADIOLOGY REPORT*  Clinical Data: Palpitations  PORTABLE CHEST - 1 VIEW  Comparison: Chest radiograph 07/08/2012  Findings: Normal mediastinum and cardiac silhouette.  Normal pulmonary  vasculature.  No evidence of effusion, infiltrate, or pneumothorax.  No acute bony abnormality.  IMPRESSION: No acute cardiopulmonary process.   Original Report Authenticated By: Genevive Bi, M.D.     Lab workup is negative.  He still has some tachycardia, rate 110 or so.  Advised to take metoprolol 25 mg bid, continue his other BP medications, and followup with Dr. Leonides Sake, his PCP, for his palpitations and for hyperglycemia.    1. Palpitations   2. Hyperglycemia            Carleene Cooper III, MD 07/08/12 2007

## 2012-07-08 NOTE — ED Notes (Signed)
Per [facility], pt woke up this morning with palpitations and thought it was an anxiety attack at 0200 and thought it would go away but didn't. 18g LAC, Hx of SVT and anxiety. CBG 172.

## 2012-12-13 ENCOUNTER — Encounter: Payer: Self-pay | Admitting: Gastroenterology

## 2013-01-10 ENCOUNTER — Ambulatory Visit (AMBULATORY_SURGERY_CENTER): Payer: BC Managed Care – PPO

## 2013-01-10 VITALS — Ht 68.0 in | Wt 168.0 lb

## 2013-01-10 DIAGNOSIS — Z1211 Encounter for screening for malignant neoplasm of colon: Secondary | ICD-10-CM

## 2013-01-10 MED ORDER — MOVIPREP 100 G PO SOLR: 1.0000 | Freq: Once | ORAL | Status: DC

## 2013-01-13 ENCOUNTER — Encounter: Payer: Self-pay | Admitting: Gastroenterology

## 2013-01-24 ENCOUNTER — Ambulatory Visit (AMBULATORY_SURGERY_CENTER): Payer: BC Managed Care – PPO | Admitting: Gastroenterology

## 2013-01-24 ENCOUNTER — Encounter: Payer: Self-pay | Admitting: Gastroenterology

## 2013-01-24 VITALS — BP 136/78 | HR 87 | Temp 97.6°F | Resp 20 | Ht 68.0 in | Wt 158.0 lb

## 2013-01-24 DIAGNOSIS — Z1211 Encounter for screening for malignant neoplasm of colon: Secondary | ICD-10-CM

## 2013-01-24 LAB — GLUCOSE, CAPILLARY
Glucose-Capillary: 93 mg/dL (ref 70–99)
Glucose-Capillary: 102 mg/dL — ABNORMAL HIGH (ref 70–99)

## 2013-01-24 MED ORDER — SODIUM CHLORIDE 0.9 % IV SOLN: 500.0000 mL | INTRAVENOUS | Status: DC

## 2013-01-24 NOTE — Patient Instructions (Signed)
YOU HAD AN ENDOSCOPIC PROCEDURE TODAY AT THE [facility]: Refer to the procedure report that was given to you for any specific questions about what was found during the examination.  If the procedure report does not answer your questions, please call your gastroenterologist to clarify.  If you requested that your care partner not be given the details of your procedure findings, then the procedure report has been included in a sealed envelope for you to review at your convenience later.  YOU SHOULD EXPECT: Some feelings of bloating in the abdomen. Passage of more gas than usual.  Walking can help get rid of the air that was put into your GI tract during the procedure and reduce the bloating. If you had a lower endoscopy (such as a colonoscopy or flexible sigmoidoscopy) you may notice spotting of blood in your stool or on the toilet paper. If you underwent a bowel prep for your procedure, then you may not have a normal bowel movement for a few days.  DIET: Your first meal following the procedure should be a light meal and then it is ok to progress to your normal diet.  A half-sandwich or bowl of soup is an example of a good first meal.  Heavy or fried foods are harder to digest and may make you feel nauseous or bloated.  Likewise meals heavy in dairy and vegetables can cause extra gas to form and this can also increase the bloating.  Drink plenty of fluids but you should avoid alcoholic beverages for 24 hours.  ACTIVITY: Your care partner should take you home directly after the procedure.  You should plan to take it easy, moving slowly for the rest of the day.  You can resume normal activity the day after the procedure however you should NOT DRIVE or use heavy machinery for 24 hours (because of the sedation medicines used during the test).    SYMPTOMS TO REPORT IMMEDIATELY: A gastroenterologist can be reached at any hour.  During normal business hours, 8:30 AM to 5:00 PM Monday through Friday,  call [phone].  After hours and on weekends, please call the GI answering service at [phone] who will take a message and have the physician on call contact you.   Following lower endoscopy (colonoscopy or flexible sigmoidoscopy):  Excessive amounts of blood in the stool  Significant tenderness or worsening of abdominal pains  Swelling of the abdomen that is new, acute  Fever of 100F or higher    FOLLOW UP: If any biopsies were taken you will be contacted by phone or by letter within the next 1-3 weeks.  Call your gastroenterologist if you have not heard about the biopsies in 3 weeks.  Our staff will call the home number listed on your records the next business day following your procedure to check on you and address any questions or concerns that you may have at that time regarding the information given to you following your procedure. This is a courtesy call and so if there is no answer at the home number and we have not heard from you through the emergency physician on call, we will assume that you have returned to your regular daily activities without incident.  SIGNATURES/CONFIDENTIALITY: You and/or your care partner have signed paperwork which will be entered into your electronic medical record.  These signatures attest to the fact that that the information above on your After Visit Summary has been reviewed and is understood.  Full responsibility of the confidentiality   of this discharge information lies with you and/or your care-partner.    High fiber diet & instructions to take a fiber supplement daily

## 2013-01-24 NOTE — Op Note (Signed)
[facility] [address] [place], [address]   COLONOSCOPY PROCEDURE REPORT  PATIENT: Sean, Kirby.  MR#: [id number] BIRTHDATE: [birth year] , 56  yrs. old GENDER: Male ENDOSCOPIST: Mardella Layman, MD, [facility] REFERRED MW:[id number] Tiburcio Pea, M.D. PROCEDURE DATE:  01/24/2013 PROCEDURE:   Colonoscopy, screening ASA CLASS:   Class II INDICATIONS:average risk screening. MEDICATIONS: propofol (Diprivan) 400mg  IV  DESCRIPTION OF PROCEDURE:   After the risks benefits and alternatives of the procedure were thoroughly explained, informed consent was obtained.  A digital rectal exam revealed no abnormalities of the rectum.   The LB [id number]  endoscope was introduced through the anus and advanced to the cecum, which was identified by both the appendix and ileocecal valve. No adverse events experienced.   The quality of the prep was adequate.  The instrument was then slowly withdrawn as the colon was fully examined.      COLON FINDINGS: A normal appearing cecum, ileocecal valve, and appendiceal orifice were identified.  The ascending, hepatic flexure, transverse, splenic flexure, descending, sigmoid colon and rectum appeared unremarkable.  No polyps or cancers were seen. Very redundant colon making exam difficult. Retroflexed views revealed no abnormalities.  The scope was withdrawn and the procedure completed. COMPLICATIONS: There were no complications.  ENDOSCOPIC IMPRESSION: Normal colon ...no polyps noted..very redundant colon.  RECOMMENDATIONS: 1.  Continue current colorectal screening recommendations for "routine risk" patients with a repeat colonoscopy in 10 years. 2.  Continue current medications   eSigned:  Mardella Layman, MD, [facility] 01/24/2013 11:24 AM   cc:

## 2013-01-24 NOTE — Progress Notes (Signed)
Patient did not experience any of the following events: a burn prior to discharge; a fall within the facility; wrong site/side/patient/procedure/implant event; or a hospital transfer or hospital admission upon discharge from the facility. (G8907) Patient did not have preoperative order for IV antibiotic SSI prophylaxis. (G8918)  

## 2013-01-24 NOTE — Progress Notes (Signed)
Procedure ends, to recovery, report given and VSS. 

## 2013-01-25 ENCOUNTER — Telehealth: Payer: Self-pay | Admitting: *Deleted

## 2013-01-25 NOTE — Telephone Encounter (Signed)
  Follow up Call-  Call back number 01/24/2013  Post procedure Call Back phone  # [phone]  Permission to leave phone message Yes     Patient questions:  Do you have a fever, pain , or abdominal swelling? no Pain Score  0 *  Have you tolerated food without any problems? yes  Have you been able to return to your normal activities? yes  Do you have any questions about your discharge instructions: Diet   no Medications  no Follow up visit  no  Do you have questions or concerns about your Care? no  Actions: * If pain score is 4 or above: No action needed, pain <4.

## 2015-01-30 ENCOUNTER — Ambulatory Visit
Admission: RE | Admit: 2015-01-30 | Discharge: 2015-01-30 | Disposition: A | Discharge status: Home / Self Care | Payer: BLUE CROSS/BLUE SHIELD | Source: Ambulatory Visit | Attending: Family Medicine | Admitting: Family Medicine

## 2015-01-30 ENCOUNTER — Other Ambulatory Visit: Payer: Self-pay | Admitting: Family Medicine

## 2015-01-30 DIAGNOSIS — R2232 Localized swelling, mass and lump, left upper limb: Secondary | ICD-10-CM

## 2016-04-18 ENCOUNTER — Ambulatory Visit
Admission: RE | Admit: 2016-04-18 | Discharge: 2016-04-18 | Disposition: A | Discharge status: Home / Self Care | Payer: BLUE CROSS/BLUE SHIELD | Source: Ambulatory Visit | Attending: Family Medicine | Admitting: Family Medicine

## 2016-04-18 ENCOUNTER — Other Ambulatory Visit: Payer: Self-pay | Admitting: Family Medicine

## 2016-04-18 DIAGNOSIS — M5442 Lumbago with sciatica, left side: Secondary | ICD-10-CM

## 2016-04-18 DIAGNOSIS — M5441 Lumbago with sciatica, right side: Secondary | ICD-10-CM

## 2016-06-16 HISTORY — PX: HERNIA REPAIR: SHX51

## 2016-06-26 ENCOUNTER — Other Ambulatory Visit: Payer: Self-pay | Admitting: Family Medicine

## 2016-06-26 DIAGNOSIS — M5416 Radiculopathy, lumbar region: Secondary | ICD-10-CM

## 2016-07-04 ENCOUNTER — Other Ambulatory Visit: Payer: BLUE CROSS/BLUE SHIELD

## 2016-07-21 ENCOUNTER — Ambulatory Visit
Admission: RE | Admit: 2016-07-21 | Discharge: 2016-07-21 | Disposition: A | Discharge status: Home / Self Care | Payer: BLUE CROSS/BLUE SHIELD | Source: Ambulatory Visit | Attending: Family Medicine | Admitting: Family Medicine

## 2016-07-21 DIAGNOSIS — M5416 Radiculopathy, lumbar region: Secondary | ICD-10-CM

## 2016-08-12 DIAGNOSIS — M4317 Spondylolisthesis, lumbosacral region: Secondary | ICD-10-CM | POA: Insufficient documentation

## 2016-08-12 DIAGNOSIS — M4306 Spondylolysis, lumbar region: Secondary | ICD-10-CM | POA: Insufficient documentation

## 2016-10-10 DIAGNOSIS — F419 Anxiety disorder, unspecified: Secondary | ICD-10-CM | POA: Insufficient documentation

## 2017-04-23 IMAGING — MR MR LUMBAR SPINE W/O CM
4 of 5 series · 23 of 48 positions shown · non-contrast
Comparison: None.

CLINICAL DATA: Low back pain with left leg pain on off since [place]

EXAM:
MRI LUMBAR SPINE WITHOUT CONTRAST
TECHNIQUE: Multiplanar, multisequence MR imaging of the lumbar spine was
performed. No intravenous contrast was administered.

[Series 2: T2 · sagittal · 4.0mm · 0.44mm/px · 6 of 12 slices shown (1 of 2)]
[im 1/12]
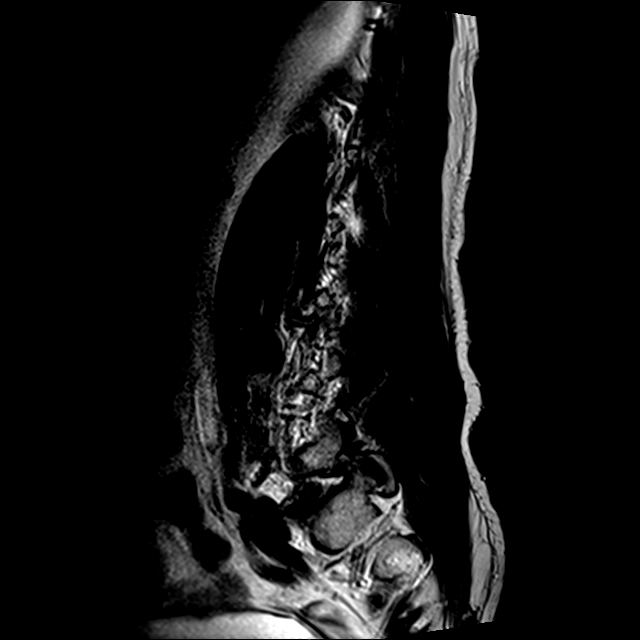
[im 3/12]
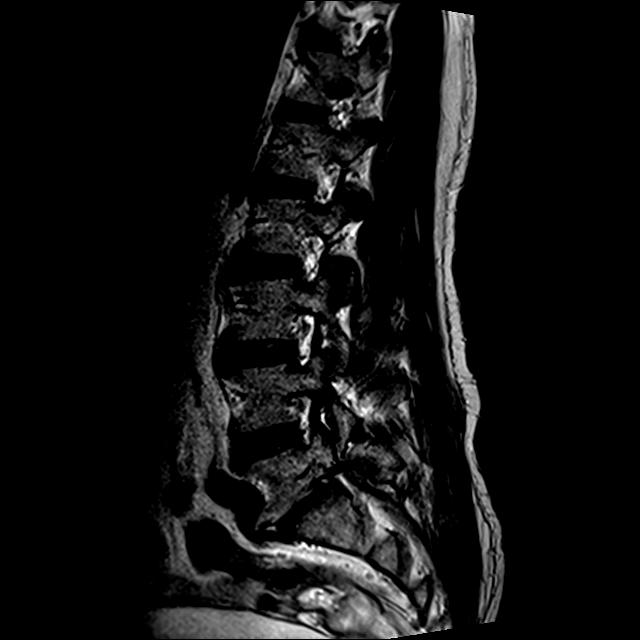
[im 5/12]
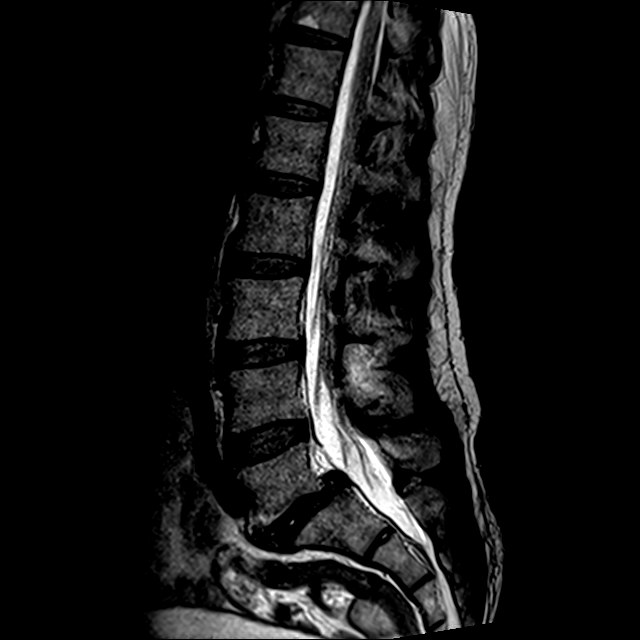
[im 7/12]
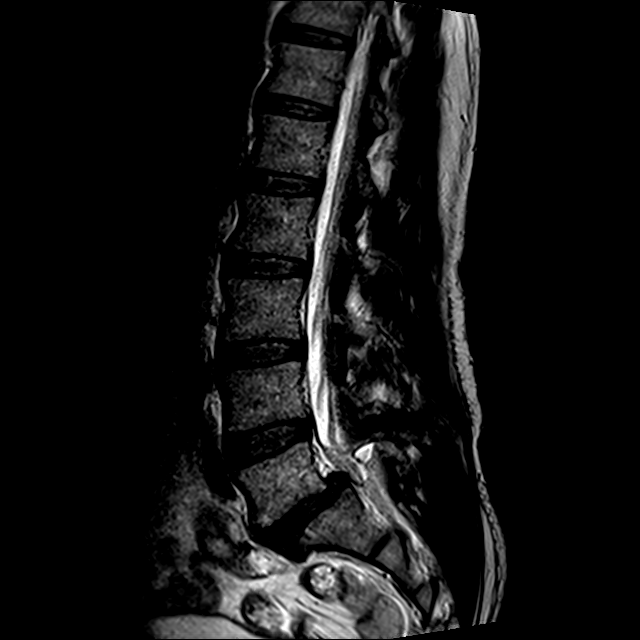
[im 9/12]
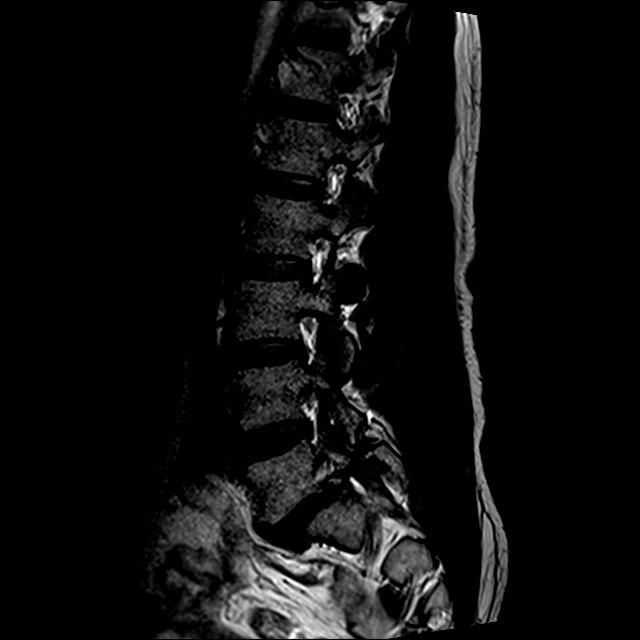
[im 12/12]
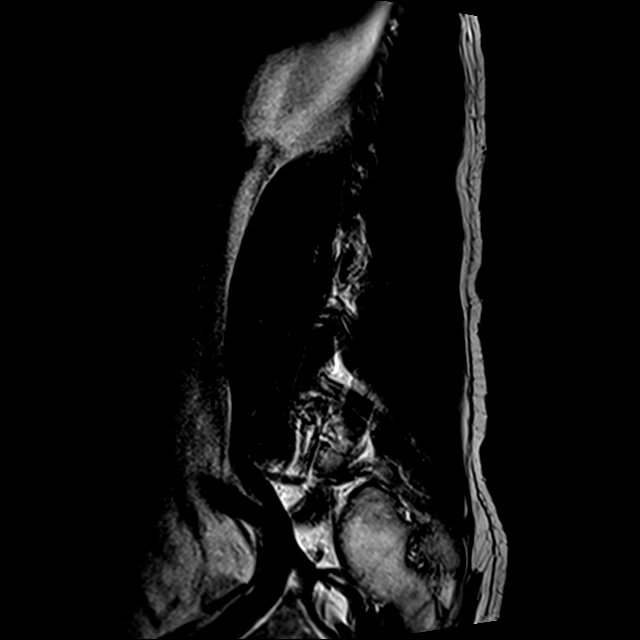

[Series 3: T1 · sagittal · 4.0mm · 0.55mm/px · 5 of 12 slices shown (1 of 2)]
[im 1/12]
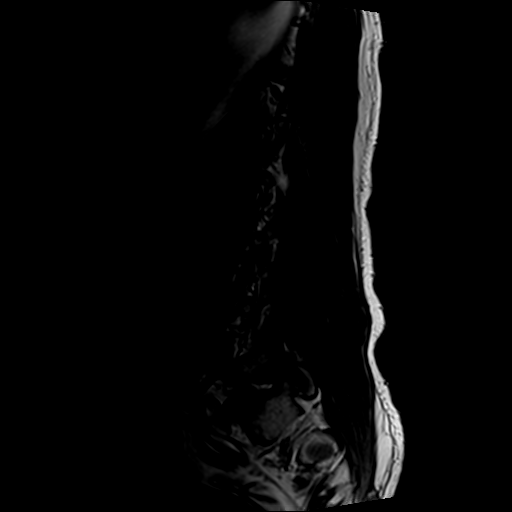
[im 3/12]
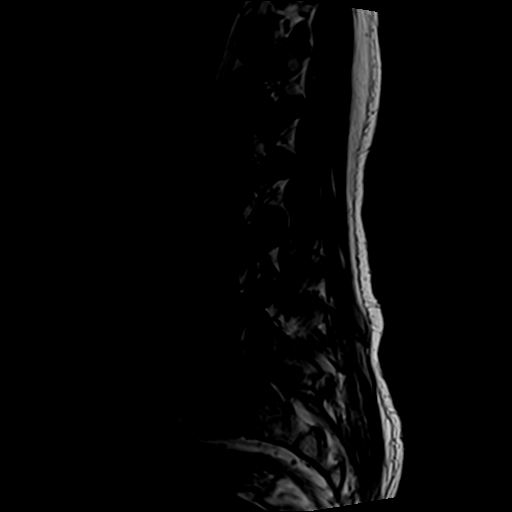
[im 5/12]
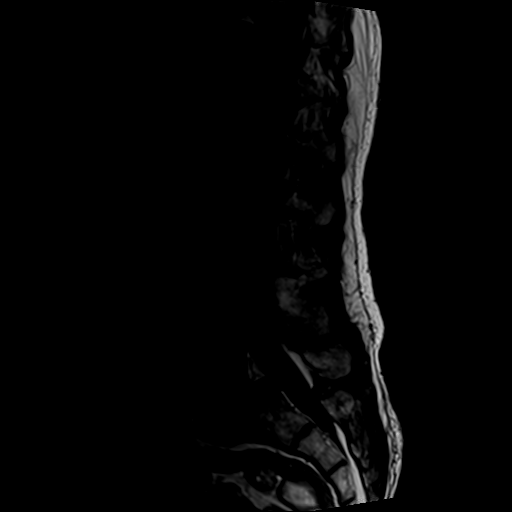
[im 7/12]
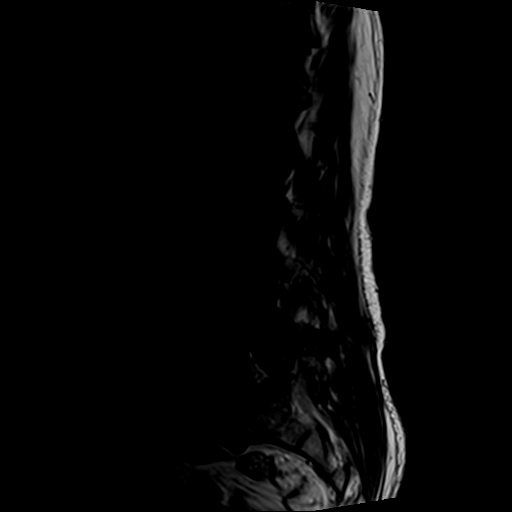
[im 12/12]
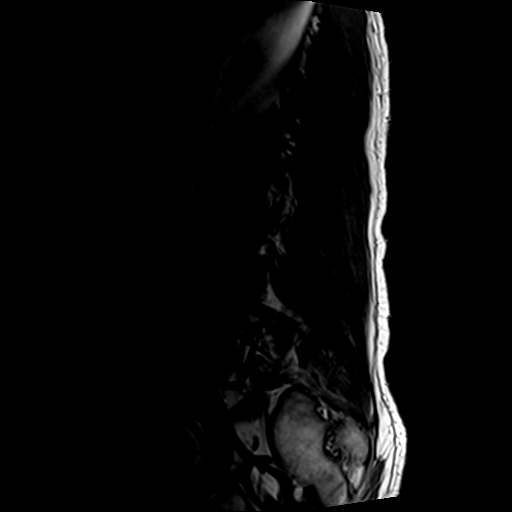

[Series 7: T2 · axial · 4.0mm · 0.74mm/px · z∈[-113,+79]mm · 9 of 31 slices shown (2 of 2)]
[im 1/31]
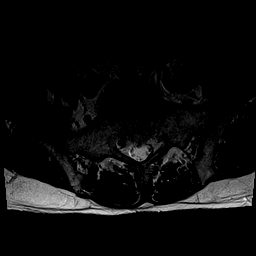
[im 5/31]
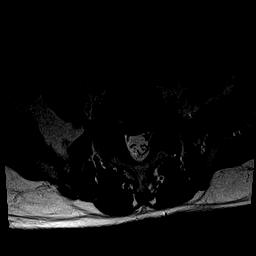
[im 9/31]
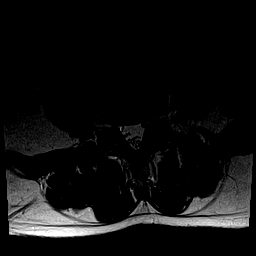
[im 13/31]
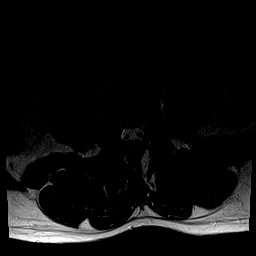
[im 16/31]
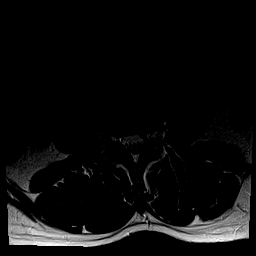
[im 18/31]
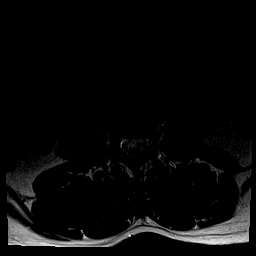
[im 22/31]
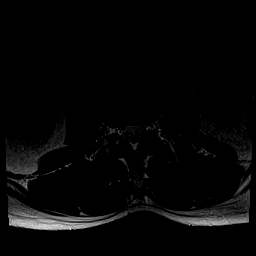
[im 26/31]
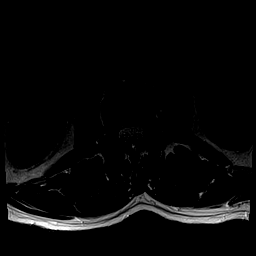
[im 31/31]
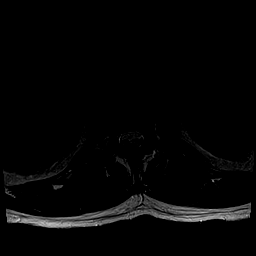

[Series 8: T1 · axial · 4.0mm · 0.37mm/px · z∈[-91,+52]mm · 3 of 31 slices shown (2 of 2)]
[im 5/31]
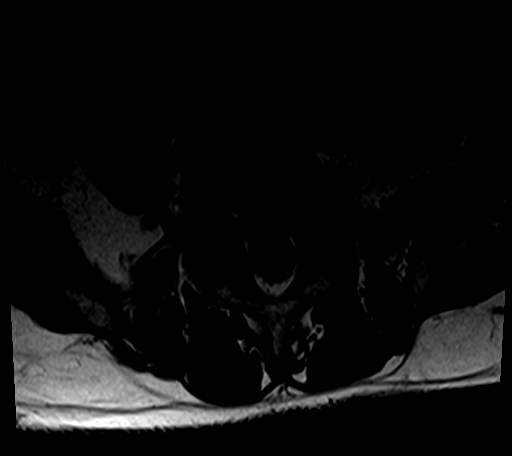
[im 16/31]
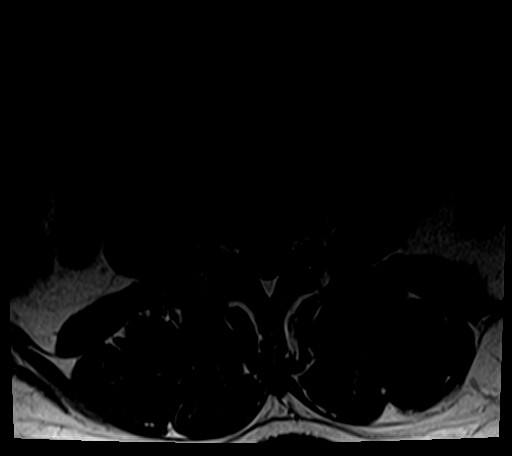
[im 26/31]
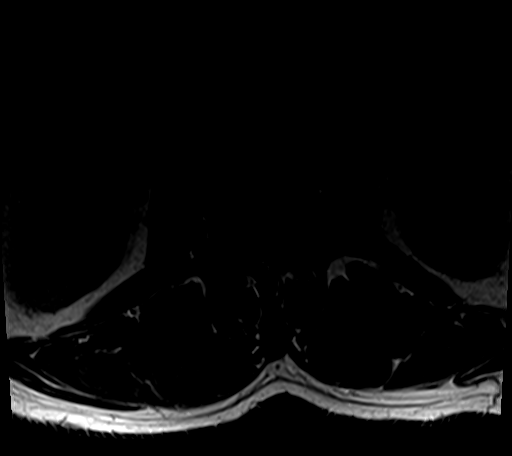

[23 of 48 positions shown; findings below may reference images not displayed]

FINDINGS: Segmentation:  Standard.

Alignment: Grade 1 anterolisthesis of L5 on S1 secondary to
bilateral L5 pars interarticularis defects.

Vertebrae:  No fracture, evidence of discitis, or bone lesion.

Conus medullaris: Extends to the L1 level and appears normal.

Paraspinal and other soft tissues: Negative.

Disc levels:

Disc spaces: Degenerative disc disease with disc height loss at
L5-S1.

T12-L1: No significant disc bulge. No evidence of neural foraminal
stenosis. No central canal stenosis.

L1-L2: No significant disc bulge. No evidence of neural foraminal
stenosis. No central canal stenosis.

L2-L3: No significant disc bulge. No evidence of neural foraminal
stenosis. No central canal stenosis.

L3-L4: No significant disc bulge. No evidence of neural foraminal
stenosis. No central canal stenosis.

L4-L5: No significant disc bulge. No evidence of neural foraminal
stenosis. No central canal stenosis.

L5-S1: Broad-based disc bulge. Mild bilateral facet arthropathy.
Moderate-severe bilateral foraminal stenosis left greater than
right. No central canal stenosis.
IMPRESSION: 1. Grade 1 anterolisthesis of L5 on S1 secondary to bilateral L5
pars interarticularis defects. Moderate-severe bilateral foraminal
stenosis, left greater than right.

## 2019-06-17 DIAGNOSIS — C44319 Basal cell carcinoma of skin of other parts of face: Secondary | ICD-10-CM

## 2019-06-17 HISTORY — DX: Basal cell carcinoma of skin of other parts of face: C44.319

## 2019-07-24 ENCOUNTER — Ambulatory Visit: Payer: Medicare Other | Attending: Internal Medicine

## 2019-07-24 DIAGNOSIS — Z23 Encounter for immunization: Secondary | ICD-10-CM | POA: Insufficient documentation

## 2019-07-24 NOTE — Progress Notes (Signed)
   Covid-19 Vaccination Clinic  Name:  Sean Kirby    MRN: [id number] DOB: Jun 12, 1956  07/24/2019  Sean Kirby was observed post Covid-19 immunization for 15 minutes without incidence. He was provided with Vaccine Information Sheet and instruction to access the V-Safe system.   Sean Kirby was instructed to call 911 with any severe reactions post vaccine: Marland Kitchen Difficulty breathing  . Swelling of your face and throat  . A fast heartbeat  . A bad rash all over your body  . Dizziness and weakness    Immunizations Administered    Name Date Dose VIS Date Route   Pfizer COVID-19 Vaccine 07/24/2019  2:47 PM 0.3 mL 05/27/2019 Intramuscular   Manufacturer: [place]   Lot: [id number]   [place]: [id number]

## 2019-08-17 ENCOUNTER — Ambulatory Visit: Payer: Medicare Other | Attending: Internal Medicine

## 2019-08-17 ENCOUNTER — Ambulatory Visit: Payer: Medicare Other

## 2019-08-17 DIAGNOSIS — Z23 Encounter for immunization: Secondary | ICD-10-CM | POA: Insufficient documentation

## 2019-08-17 NOTE — Progress Notes (Signed)
   Covid-19 Vaccination Clinic  Name:  [name]    MRN: [id number] DOB: 1956-04-14  08/17/2019  Sean Kirby was observed post Covid-19 immunization for 15 minutes without incident. He was provided with Vaccine Information Sheet and instruction to access the V-Safe system.   [name] was instructed to call 911 with any severe reactions post vaccine: Marland Kitchen Difficulty breathing  . Swelling of face and throat  . A fast heartbeat  . A bad rash all over body  . Dizziness and weakness   Immunizations Administered    Name Date Dose VIS Date Route   Pfizer COVID-19 Vaccine 08/17/2019  1:56 PM 0.3 mL 05/27/2019 Intramuscular   Manufacturer: [place]   Lot: [id number]   [place]: [id number]

## 2020-04-05 ENCOUNTER — Other Ambulatory Visit: Payer: Self-pay | Admitting: Family Medicine

## 2020-04-05 ENCOUNTER — Encounter: Payer: Self-pay | Admitting: Neurology

## 2020-04-05 DIAGNOSIS — H532 Diplopia: Secondary | ICD-10-CM

## 2020-04-08 ENCOUNTER — Other Ambulatory Visit: Payer: Self-pay

## 2020-04-08 ENCOUNTER — Ambulatory Visit
Admission: RE | Admit: 2020-04-08 | Discharge: 2020-04-08 | Disposition: A | Discharge status: Home / Self Care | Payer: Medicare Other | Source: Ambulatory Visit | Attending: Family Medicine | Admitting: Family Medicine

## 2020-04-08 DIAGNOSIS — H532 Diplopia: Secondary | ICD-10-CM

## 2020-04-08 MED ORDER — GADOBENATE DIMEGLUMINE 529 MG/ML IV SOLN
15.0000 mL | Freq: Once | INTRAVENOUS | Status: AC | PRN
  Administered 2020-04-08: 15 mL via INTRAVENOUS

## 2020-04-09 NOTE — Progress Notes (Signed)
NEUROLOGY CONSULTATION NOTE  Sean Kirby MRN: [id number] DOB: 10/14/1955  Referring provider: Shirline Frees, MD Primary care provider: Shirline Frees, MD  Reason for consult:  Double vision  HISTORY OF PRESENT ILLNESS: Sean Kirby is a 64 year old right-handed male with type 2 diabetes mellitus with neuropathy, glaucoma, HTN, and HLD who presents for diplopia.  History supplemented by referring provider's note.  Around September 17, he developed a rash involving the left nasolabial fold and left side of his nose.  He had a left sided headache that lasted several days.  He saw dermatology and was told he had shingles.  He was treated with valacyclovir and doxycycline.  He had an eye exam a week later which was unremarkable.  On October 2, he woke up with blurred vision.  He realized that he had double vision when looking down and to either side.  He denied head injury.  The double vision seemed more vertical than horizontal.  It resolved if he covered either eye.  However, he also noted decreased visual acuity out of the left eye.  He followed up again with his ophthalmologist who told him his exam looked fine.  Denies headache, ocular pain, left sided hearing loss, left sided facial numbness or facial weakness.  He has chronic tinnitus.  MRI of brain with and without contrast from 04/08/2020 personally reviewed was unremarkable with no evidence of brainstem infarct, mass lesion or aneurysm.  Labs from 03/15/2020 included Hgb A1c 7.7 and TSH 0.96.  Since onset, symptoms have improved but not yet completely resolved.   PAST MEDICAL HISTORY: Past Medical History:  Diagnosis Date  . Anxiety   . Arthritis   . Diabetes mellitus   . GERD (gastroesophageal reflux disease)   . Glaucoma   . Hyperlipidemia   . Hypertension   . Hypoglycemia   . Neuromuscular disorder    neuropathy feet    PAST SURGICAL HISTORY: Past Surgical History:  Procedure Laterality Date  . GLAUCOMA  REPAIR  2006   lazer-low angle -both  . HERNIA REPAIR    . INGUINAL HERNIA REPAIR  12/02/2011   Procedure: HERNIA REPAIR INGUINAL ADULT;  Surgeon: [name], MD;  Location: [facility];  Service: General;  Laterality: Left;  left inguinal  . SIGMOIDOSCOPY    . TONSILLECTOMY AND ADENOIDECTOMY      MEDICATIONS: Current Outpatient Medications on File Prior to Visit  Medication Sig Dispense Refill  . amLODipine (NORVASC) 10 MG tablet Take 10 mg by mouth daily.    Marland Kitchen aspirin 325 MG buffered tablet Take 325 mg by mouth daily.     . bimatoprost (LUMIGAN) 0.01 % SOLN Place 1 drop into both eyes at bedtime.    Marland Kitchen escitalopram (LEXAPRO) 5 MG tablet Take 5 mg by mouth daily.    Marland Kitchen glucose blood test strip 1 each by Other route as needed. Use as instructed    . LORazepam (ATIVAN) 0.5 MG tablet Take 0.5 mg by mouth 2 (two) times daily.     . metoprolol (LOPRESSOR) 50 MG tablet Take 0.5 tablets (25 mg total) by mouth 2 (two) times daily. 30 tablet 0  . MOVIPREP 100 G SOLR Take 1 kit (200 g total) by mouth once. 1 kit 0  . Multiple Vitamin (MULTIVITAMIN WITH MINERALS) TABS Take 1 tablet by mouth daily.    . pravastatin (PRAVACHOL) 20 MG tablet Take 20 mg by mouth daily.    . quinapril-hydrochlorothiazide (ACCURETIC) 20-12.5 MG per tablet  Take 1 tablet by mouth daily.     No current facility-administered medications on file prior to visit.    ALLERGIES: Allergies  Allergen Reactions  . Codeine     Patient unsure of reaction.  Sarina Ill [Sulfamethoxazole-Trimethoprim]     Patient unsure of reaction.  . Glimepiride Other (See Comments)    Sugar bottoms out  . Metformin And Related Other (See Comments)    Sugar bottoms out  . Simvastatin Diarrhea    FAMILY HISTORY: Family History  Problem Relation Age of Onset  . Heart disease Mother   . Heart disease Maternal Aunt   . Heart disease Maternal Grandmother   . Colon cancer Neg Hx    SOCIAL HISTORY: Social History    Socioeconomic History  . Marital status: Single    Spouse name: Not on file  . Number of children: Not on file  . Years of education: Not on file  . Highest education level: Not on file  Occupational History  . Not on file  Tobacco Use  . Smoking status: Never Smoker  . Smokeless tobacco: Never Used  Substance and Sexual Activity  . Alcohol use: No  . Drug use: No  . Sexual activity: Not on file  Other Topics Concern  . Not on file  Social History Narrative  . Not on file   Social Determinants of Health   Financial Resource Strain:   . Difficulty of Paying Living Expenses: Not on file  Food Insecurity:   . Worried About Charity fundraiser in the Last Year: Not on file  . Ran Out of Food in the Last Year: Not on file  Transportation Needs:   . Lack of Transportation (Medical): Not on file  . Lack of Transportation (Non-Medical): Not on file  Physical Activity:   . Days of Exercise per Week: Not on file  . Minutes of Exercise per Session: Not on file  Stress:   . Feeling of Stress : Not on file  Social Connections:   . Frequency of Communication with Friends and Family: Not on file  . Frequency of Social Gatherings with Friends and Family: Not on file  . Attends Religious Services: Not on file  . Active Member of Clubs or Organizations: Not on file  . Attends Archivist Meetings: Not on file  . Marital Status: Not on file  Intimate Partner Violence:   . Fear of Current or Ex-Partner: Not on file  . Emotionally Abused: Not on file  . Physically Abused: Not on file  . Sexually Abused: Not on file   PHYSICAL EXAM: Blood pressure 135/76, pulse 78, height '5\' 9"'  (1.753 m), weight 160 lb (72.6 kg), SpO2 98 %. General: No acute distress.  Patient appears well-groomed.   Head:  Normocephalic/atraumatic Eyes:  fundi examined but not visualized Neck: supple, no paraspinal tenderness, full range of motion Back: No paraspinal tenderness Heart: regular rate and  rhythm Lungs: Clear to auscultation bilaterally. Vascular: No carotid bruits. Neurological Exam: Mental status: alert and oriented to person, place, and time, recent and remote memory intact, fund of knowledge intact, attention and concentration intact, speech fluent and not dysarthric, language intact. Cranial nerves: CN I: not tested CN II: pupils equal, round and reactive to light, visual fields intact CN III, IV, VI:  full range of motion, no nystagmus, no ptosis.  However, when looking down and to the right, he reported double vision.  Tilting head to the right resolved. CN V: facial  sensation intact CN VII: upper and lower face symmetric CN VIII: hearing intact CN IX, X: gag intact, uvula midline CN XI: sternocleidomastoid and trapezius muscles intact CN XII: tongue midline Bulk & Tone: normal, no fasciculations. Motor:  5/5 throughout Sensation:  Pinprick and vibration sensation intact.   Deep Tendon Reflexes:  2+ throughout, toes downgoing.   Finger to nose testing:  Without dysmetria.   Heel to shin:  Without dysmetria.   Gait:  Normal station and stride.  Romberg negative  IMPRESSION: 1.  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  Likely post-herpetic or ischemic secondary to diabetes.  Either way, I anticipate he will make a full recovery.  He reports that his cousin has myasthenia gravis.  For sake of completeness, I will check a Myasthenia gravis panel.  PLAN: 1.  Myasthenia gravis panel 2.  Follow up in 4 to 6 months for recheck (sooner if needed).  Thank you for allowing me to take part in the care of this patient.  Metta Clines, DO  CC: Shirline Frees, MD

## 2020-04-10 ENCOUNTER — Ambulatory Visit (INDEPENDENT_AMBULATORY_CARE_PROVIDER_SITE_OTHER): Payer: Medicare Other | Admitting: Neurology

## 2020-04-10 ENCOUNTER — Other Ambulatory Visit: Payer: Self-pay

## 2020-04-10 ENCOUNTER — Encounter: Payer: Self-pay | Admitting: Neurology

## 2020-04-10 ENCOUNTER — Other Ambulatory Visit (INDEPENDENT_AMBULATORY_CARE_PROVIDER_SITE_OTHER): Payer: Medicare Other

## 2020-04-10 VITALS — BP 135/76 | HR 78 | Ht 69.0 in | Wt 160.0 lb

## 2020-04-10 DIAGNOSIS — H4912 Fourth [trochlear] nerve palsy, left eye: Secondary | ICD-10-CM

## 2020-04-10 DIAGNOSIS — H532 Diplopia: Secondary | ICD-10-CM

## 2020-04-10 DIAGNOSIS — E119 Type 2 diabetes mellitus without complications: Secondary | ICD-10-CM | POA: Diagnosis not present

## 2020-04-10 DIAGNOSIS — Z794 Long term (current) use of insulin: Secondary | ICD-10-CM

## 2020-04-10 NOTE — Patient Instructions (Signed)
It sounds like you have a trochlear nerve palsy involving the left eye.  It is most likely due to the shingles or diabetes.  Either way, it should resolve.  Just to be complete, I will check blood work for myasthenia gravis.  Follow up in 4 to 6 months (sooner if any problems) just to make sure you are doing well.

## 2020-04-13 LAB — MYASTHENIA GRAVIS PANEL 1
A CHR BINDING ABS: <0.3 nmol/L
STRIATED MUSCLE AB SCREEN: NEGATIVE

## 2020-06-16 DIAGNOSIS — I2510 Atherosclerotic heart disease of native coronary artery without angina pectoris: Secondary | ICD-10-CM

## 2020-06-16 HISTORY — DX: Atherosclerotic heart disease of native coronary artery without angina pectoris: I25.10

## 2020-08-20 NOTE — Progress Notes (Signed)
NEUROLOGY FOLLOW UP OFFICE NOTE  Sean Kirby [id number]  Assessment/Plan:   Left trochlear nerve palsy.  More likely ischemic secondary to diabetes but postherpetic also possible - resolved  Follow up as needed.  Subjective:  Sean Kirby. Rack is a 65 year old right-handed male with type 2 diabetes mellitus with neuropathy, glaucoma, HTN, and HLD who follows up for left trochlear nerve palsy.  UPDATE: Myasthenia gravis panel in October was negative. Double vision has resolved.  He is feeling well.  HISTORY: Around March 02, 2020, he developed a rash involving the left nasolabial fold and left side of his nose.  He had a left sided headache that lasted several days.  He saw dermatology and was told he had shingles.  He was treated with valacyclovir and doxycycline.  He had an eye exam a week later which was unremarkable.  On October 2, he woke up with blurred vision.  He realized that he had double vision when looking down and to either side.  He denied head injury.  The double vision seemed more vertical than horizontal.  It resolved if he covered either eye.  However, he also noted decreased visual acuity out of the left eye.  He followed up again with his ophthalmologist who told him his exam looked fine.  Denies headache, ocular pain, left sided hearing loss, left sided facial numbness or facial weakness.  He has chronic tinnitus.  MRI of brain with and without contrast from 04/08/2020 personally reviewed was unremarkable with no evidence of brainstem infarct, mass lesion or aneurysm.  Labs from 03/15/2020 included Hgb A1c 7.7 and TSH 0.96.  Since onset, symptoms have improved but not yet completely resolved.  PAST MEDICAL HISTORY: Past Medical History:  Diagnosis Date  . Anxiety   . Arthritis   . Diabetes mellitus   . GERD (gastroesophageal reflux disease)   . Glaucoma   . Hyperlipidemia   . Hypertension   . Hypoglycemia   . Neuromuscular disorder ([place])     neuropathy feet    MEDICATIONS: Current Outpatient Medications on File Prior to Visit  Medication Sig Dispense Refill  . amLODipine (NORVASC) 10 MG tablet Take 10 mg by mouth daily.    Marland Kitchen aspirin 325 MG buffered tablet Take 325 mg by mouth daily.     . bimatoprost (LUMIGAN) 0.01 % SOLN Place 1 drop into both eyes at bedtime.    Marland Kitchen escitalopram (LEXAPRO) 5 MG tablet Take 5 mg by mouth daily. (Patient not taking: Reported on 04/10/2020)    . glucose blood test strip 1 each by Other route as needed. Use as instructed    . LORazepam (ATIVAN) 0.5 MG tablet Take 0.5 mg by mouth 2 (two) times daily.     . metoprolol (LOPRESSOR) 50 MG tablet Take 0.5 tablets (25 mg total) by mouth 2 (two) times daily. (Patient not taking: Reported on 04/10/2020) 30 tablet 0  . MOVIPREP 100 G SOLR Take 1 kit (200 g total) by mouth once. (Patient not taking: Reported on 04/10/2020) 1 kit 0  . Multiple Vitamin (MULTIVITAMIN WITH MINERALS) TABS Take 1 tablet by mouth daily.    . pravastatin (PRAVACHOL) 20 MG tablet Take 20 mg by mouth daily.    . quinapril-hydrochlorothiazide (ACCURETIC) 20-12.5 MG per tablet Take 1 tablet by mouth daily.     No current facility-administered medications on file prior to visit.    ALLERGIES: Allergies  Allergen Reactions  . Codeine     Patient unsure of reaction.  Marland Kitchen  Septra [Sulfamethoxazole-Trimethoprim]     Patient unsure of reaction.  . Glimepiride Other (See Comments)    Sugar bottoms out  . Metformin And Related Other (See Comments)    Sugar bottoms out  . Simvastatin Diarrhea    FAMILY HISTORY: Family History  Problem Relation Age of Onset  . Heart disease Mother   . Heart disease Maternal Aunt   . Heart disease Maternal Grandmother   . Colon cancer Neg Hx       Objective:  Blood pressure (!) 154/88, pulse 78, height '5\' 8"'  (1.727 m), weight 162 lb 3.2 oz (73.6 kg), SpO2 99 %. General: No acute distress.  Patient appears well-groomed.   Head:   Normocephalic/atraumatic Eyes:  Fundi examined but not visualized Neck: supple, no paraspinal tenderness, full range of motion Heart:  Regular rate and rhythm Lungs:  Clear to auscultation bilaterally Back: No paraspinal tenderness Neurological Exam: alert and oriented to person, place, and time. Attention span and concentration intact, recent and remote memory intact, fund of knowledge intact.  Speech fluent and not dysarthric, language intact.  CN II-XII intact. Bulk and tone normal, muscle strength 5/5 throughout.  Sensation to light touch, temperature and vibration intact.  Deep tendon reflexes 2+ throughout, toes downgoing.  Finger to nose and heel to shin testing intact.  Gait normal, Romberg negative.     Sean Clines, DO  CC:  Shirline Frees, MD

## 2020-08-22 ENCOUNTER — Ambulatory Visit (INDEPENDENT_AMBULATORY_CARE_PROVIDER_SITE_OTHER): Payer: Medicare Other | Admitting: Neurology

## 2020-08-22 ENCOUNTER — Other Ambulatory Visit: Payer: Self-pay

## 2020-08-22 ENCOUNTER — Encounter: Payer: Self-pay | Admitting: Neurology

## 2020-08-22 VITALS — BP 154/88 | HR 78 | Ht 68.0 in | Wt 162.2 lb

## 2020-08-22 DIAGNOSIS — H4912 Fourth [trochlear] nerve palsy, left eye: Secondary | ICD-10-CM | POA: Diagnosis not present

## 2020-08-22 DIAGNOSIS — E119 Type 2 diabetes mellitus without complications: Secondary | ICD-10-CM | POA: Diagnosis not present

## 2020-08-22 DIAGNOSIS — Z794 Long term (current) use of insulin: Secondary | ICD-10-CM | POA: Diagnosis not present

## 2020-08-22 NOTE — Patient Instructions (Signed)
Follow up as needed

## 2020-10-16 ENCOUNTER — Other Ambulatory Visit: Payer: Self-pay | Admitting: Family Medicine

## 2020-10-16 ENCOUNTER — Ambulatory Visit
Admission: RE | Admit: 2020-10-16 | Discharge: 2020-10-16 | Disposition: A | Discharge status: Home / Self Care | Payer: Medicare Other | Source: Ambulatory Visit | Attending: Family Medicine | Admitting: Family Medicine

## 2020-10-16 DIAGNOSIS — M79671 Pain in right foot: Secondary | ICD-10-CM

## 2021-05-07 ENCOUNTER — Emergency Department (HOSPITAL_COMMUNITY)
Admission: EM | Admit: 2021-05-07 | Discharge: 2021-05-07 | Disposition: A | Discharge status: Home / Self Care | Payer: Medicare Other | Attending: Emergency Medicine | Admitting: Emergency Medicine

## 2021-05-07 ENCOUNTER — Emergency Department (HOSPITAL_COMMUNITY): Payer: Medicare Other

## 2021-05-07 ENCOUNTER — Encounter (HOSPITAL_COMMUNITY): Payer: Self-pay

## 2021-05-07 ENCOUNTER — Other Ambulatory Visit: Payer: Self-pay

## 2021-05-07 DIAGNOSIS — Z7984 Long term (current) use of oral hypoglycemic drugs: Secondary | ICD-10-CM | POA: Diagnosis not present

## 2021-05-07 DIAGNOSIS — R002 Palpitations: Secondary | ICD-10-CM | POA: Diagnosis not present

## 2021-05-07 DIAGNOSIS — Z79899 Other long term (current) drug therapy: Secondary | ICD-10-CM | POA: Diagnosis not present

## 2021-05-07 DIAGNOSIS — R000 Tachycardia, unspecified: Secondary | ICD-10-CM | POA: Insufficient documentation

## 2021-05-07 DIAGNOSIS — R531 Weakness: Secondary | ICD-10-CM | POA: Diagnosis present

## 2021-05-07 DIAGNOSIS — I10 Essential (primary) hypertension: Secondary | ICD-10-CM | POA: Insufficient documentation

## 2021-05-07 DIAGNOSIS — R42 Dizziness and giddiness: Secondary | ICD-10-CM

## 2021-05-07 DIAGNOSIS — E119 Type 2 diabetes mellitus without complications: Secondary | ICD-10-CM | POA: Insufficient documentation

## 2021-05-07 LAB — CBC
HCT: 44.8 % (ref 39.0–52.0)
Hemoglobin: 15.8 g/dL (ref 13.0–17.0)
MCH: 32.1 pg (ref 26.0–34.0)
MCHC: 35.3 g/dL (ref 30.0–36.0)
MCV: 91.1 fL (ref 80.0–100.0)
Platelets: 232 10*3/uL (ref 150–400)
RBC: 4.92 MIL/uL (ref 4.22–5.81)
RDW: 11.9 % (ref 11.5–15.5)
WBC: 6.6 10*3/uL (ref 4.0–10.5)
nRBC: 0 % (ref 0.0–0.2)

## 2021-05-07 LAB — COMPREHENSIVE METABOLIC PANEL
ALT: 20 U/L (ref 0–44)
AST: 27 U/L (ref 15–41)
Albumin: 4.8 g/dL (ref 3.5–5.0)
Alkaline Phosphatase: 86 U/L (ref 38–126)
Anion gap: 12 (ref 5–15)
BUN: 15 mg/dL (ref 8–23)
CO2: 24 mmol/L (ref 22–32)
Calcium: 9.7 mg/dL (ref 8.9–10.3)
Chloride: 99 mmol/L (ref 98–111)
Creatinine, Ser: 0.89 mg/dL (ref 0.61–1.24)
GFR, Estimated: >60 mL/min (ref >60)
Glucose, Bld: 193 mg/dL — ABNORMAL HIGH (ref 70–99)
Potassium: 3.9 mmol/L (ref 3.5–5.1)
Sodium: 135 mmol/L (ref 135–145)
Total Bilirubin: 0.9 mg/dL (ref 0.3–1.2)
Total Protein: 8 g/dL (ref 6.5–8.1)

## 2021-05-07 LAB — URINALYSIS, ROUTINE W REFLEX MICROSCOPIC
Bilirubin Urine: NEGATIVE
Glucose, UA: NEGATIVE mg/dL
Hgb urine dipstick: NEGATIVE
Ketones, ur: NEGATIVE mg/dL
Leukocytes,Ua: NEGATIVE
Nitrite: NEGATIVE
Protein, ur: NEGATIVE mg/dL
Specific Gravity, Urine: 1.008 (ref 1.005–1.030)
pH: 8 (ref 5.0–8.0)

## 2021-05-07 LAB — TROPONIN I (HIGH SENSITIVITY)
Troponin I (High Sensitivity): 9 ng/L (ref <18)
Troponin I (High Sensitivity): 19 ng/L — ABNORMAL HIGH (ref <18)

## 2021-05-07 LAB — TSH: TSH: 2.205 u[IU]/mL (ref 0.350–4.500)

## 2021-05-07 MED ORDER — MECLIZINE HCL 25 MG PO TABS: 25.0000 mg | ORAL_TABLET | Freq: Three times a day (TID) | ORAL | 0 refills | Status: DC | PRN

## 2021-05-07 MED ORDER — SODIUM CHLORIDE 0.9 % IV BOLUS
1000.0000 mL | Freq: Once | INTRAVENOUS | Status: AC
  Administered 2021-05-07: 1000 mL via INTRAVENOUS

## 2021-05-07 MED ORDER — LORAZEPAM 2 MG/ML IJ SOLN
1.0000 mg | Freq: Once | INTRAMUSCULAR | Status: AC
  Administered 2021-05-07: 1 mg via INTRAVENOUS
  Filled 2021-05-07: qty 1

## 2021-05-07 NOTE — ED Notes (Signed)
Pt states understanding of dc instructions, importance of follow up, and prescription. Pt denies questions or concerns upon dc. Pt declined wheelchair assistance upon dc. Pt ambulated out of ed w/ steady gait. No belongings left in room upon dc.  

## 2021-05-07 NOTE — ED Provider Notes (Signed)
[facility] DEPT Provider Note   CSN: [id number] Arrival date & time: 05/07/21  0801     History Chief Complaint  Patient presents with   Dizziness   Weakness   Palpitations    Sean Kirby is a 65 y.o. male.  Pt presents to the ED today with palpitations and dizziness.  Pt said he woke up in the middle of the night and felt like his heart was racing.  He recognized that he was having a nightmare and was able to fall back asleep.  He woke up this morning feeling very dizzy.  He felt like he could not ambulate.  He has not taken any of his meds this morning.  Dizziness is worse when moving his head and standing.  It is not too bad when he is sitting still.  No cp.  No sob.  No vision changes.      Past Medical History:  Diagnosis Date   Anxiety    Arthritis    Diabetes mellitus    GERD (gastroesophageal reflux disease)    Glaucoma    Hyperlipidemia    Hypertension    Hypoglycemia    Neuromuscular disorder ([place])    neuropathy feet    There are no problems to display for this patient.   Past Surgical History:  Procedure Laterality Date   GLAUCOMA REPAIR  2006   lazer-low angle -both   HERNIA REPAIR     INGUINAL HERNIA REPAIR  12/02/2011   Procedure: HERNIA REPAIR INGUINAL ADULT;  Surgeon: [name], MD;  Location: [facility];  Service: General;  Laterality: Left;  left inguinal   SIGMOIDOSCOPY     TONSILLECTOMY AND ADENOIDECTOMY         Family History  Problem Relation Age of Onset   Heart disease Mother    Heart disease Maternal Aunt    Heart disease Maternal Grandmother    Colon cancer Neg Hx     Social History   Tobacco Use   Smoking status: Never   Smokeless tobacco: Never  Vaping Use   Vaping Use: Never used  Substance Use Topics   Alcohol use: No   Drug use: No    Home Medications Prior to Admission medications   Medication Sig Start Date End Date Taking? Authorizing Provider   amLODipine (NORVASC) 5 MG tablet Take 5 mg by mouth at bedtime.   Yes [provider]  fexofenadine (ALLEGRA) 180 MG tablet Take 180 mg by mouth daily as needed for allergies.   Yes [provider]  ibuprofen (ADVIL) 200 MG tablet Take 200 mg by mouth every 6 (six) hours as needed for mild pain.   Yes [provider]  latanoprost (XALATAN) 0.005 % ophthalmic solution Place 1 drop into both eyes at bedtime. 05/01/21  Yes [provider]  LORazepam (ATIVAN) 0.5 MG tablet Take 0.5 mg by mouth daily as needed for anxiety.   Yes [provider]  meclizine (ANTIVERT) 25 MG tablet Take 1 tablet (25 mg total) by mouth 3 (three) times daily as needed for dizziness. 05/07/21  Yes Isla Pence, MD  metFORMIN (GLUCOPHAGE) 850 MG tablet Take 1 tablet by mouth 2 (two) times daily with a meal.   Yes [provider]  Multiple Vitamin (MULTIVITAMIN WITH MINERALS) TABS Take 1 tablet by mouth daily.   Yes [provider]  pravastatin (PRAVACHOL) 40 MG tablet Take 40 mg by mouth daily.   Yes [provider]  quinapril-hydrochlorothiazide (ACCURETIC) 20-12.5 MG per tablet Take 1 tablet by mouth daily.   Yes [provider]  timolol (TIMOPTIC) 0.5 % ophthalmic solution Place 1 drop into both eyes daily. 07/24/20  Yes [provider]  glucose blood test strip 1 each by Other route as needed. Use as instructed    [provider]    Allergies    Codeine, Septra [sulfamethoxazole-trimethoprim], Dapagliflozin, Sertraline hcl, Glimepiride, Metformin and related, and Simvastatin  Review of Systems   Review of Systems  Cardiovascular:  Positive for palpitations.  Neurological:  Positive for dizziness.  All other systems reviewed and are negative.  Physical Exam Updated Vital Signs BP 129/79   Pulse 87   Temp 98.2 F (36.8 C) (Oral)   Resp 20   Ht 5\' 8"  (1.727 m)   Wt 72.6 kg   SpO2 96%   BMI 24.33 kg/m    Physical Exam Vitals and nursing note reviewed.  Constitutional:      Appearance: Normal appearance.  HENT:     Head: Normocephalic and atraumatic.     Right Ear: External ear normal.     Left Ear: External ear normal.     Nose: Nose normal.     Mouth/Throat:     Mouth: Mucous membranes are moist.     Pharynx: Oropharynx is clear.  Eyes:     Extraocular Movements: Extraocular movements intact.     Conjunctiva/sclera: Conjunctivae normal.     Pupils: Pupils are equal, round, and reactive to light.  Cardiovascular:     Rate and Rhythm: Regular rhythm. Tachycardia present.     Pulses: Normal pulses.     Heart sounds: Normal heart sounds.  Pulmonary:     Effort: Pulmonary effort is normal.     Breath sounds: Normal breath sounds.  Abdominal:     General: Abdomen is flat. Bowel sounds are normal.     Palpations: Abdomen is soft.  Musculoskeletal:        General: Normal range of motion.     Cervical back: Normal range of motion and neck supple.  Skin:    General: Skin is warm.     Capillary Refill: Capillary refill takes less than 2 seconds.  Neurological:     General: No focal deficit present.     Mental Status: He is alert and oriented to person, place, and time.  Psychiatric:        Mood and Affect: Mood normal.        Behavior: Behavior normal.    ED Results / Procedures / Treatments   Labs (all labs ordered are listed, but only abnormal results are displayed) Labs Reviewed  COMPREHENSIVE METABOLIC PANEL - Abnormal; Notable for the following components:      Result Value   Glucose, Bld 193 (*)    All other components within normal limits  URINALYSIS, ROUTINE W REFLEX MICROSCOPIC - Abnormal; Notable for the following components:   Color, Urine STRAW (*)    All other components within normal limits  TROPONIN I (HIGH SENSITIVITY) - Abnormal; Notable for the following components:   Troponin I (High Sensitivity) 19 (*)    All other components within normal limits   CBC  TSH  TROPONIN I (HIGH SENSITIVITY)    EKG EKG Interpretation  Date/Time:  Tuesday May 07 2021 08:10:26 EST Ventricular Rate:  100 PR Interval:  185 QRS Duration: 105 QT Interval:  362 QTC Calculation: 467 R Axis:   -9 Text Interpretation: Sinus tachycardia Probable lateral  infarct, old No significant change since last tracing Confirmed by Isla Pence [phone]) on 05/07/2021 8:21:22 AM  Radiology CT Head Wo Contrast  Result Date: 05/07/2021 CLINICAL DATA:  Neuro deficit, acute stroke suspected. Dizziness, weakness since this morning. EXAM: CT HEAD WITHOUT CONTRAST TECHNIQUE: Contiguous axial images were obtained from the base of the skull through the vertex without intravenous contrast. COMPARISON:  CT head dated Nov 02, 2006. MR examination dated April 08, 2020 FINDINGS: Brain: No evidence of acute infarction, hemorrhage, hydrocephalus, extra-axial collection or mass lesion/mass effect. Vascular: No hyperdense vessel or unexpected calcification. Skull: Normal. Negative for fracture or focal lesion. Sinuses/Orbits: No acute finding. Other: None. IMPRESSION: No acute intracranial abnormality. MRI examination could be considered for further evaluation if clinically warranted. Electronically Signed   By: Keane Police D.O.   On: 05/07/2021 11:05   DG Chest Port 1 View  Result Date: 05/07/2021 CLINICAL DATA:  Palpitations EXAM: PORTABLE CHEST 1 VIEW COMPARISON:  07/08/2012 FINDINGS: The heart size and mediastinal contours are within normal limits. Both lungs are clear. The visualized skeletal structures are unremarkable. IMPRESSION: No acute abnormality of the lungs in AP portable projection Electronically Signed   By: Delanna Ahmadi M.D.   On: 05/07/2021 08:27    Procedures Procedures   Medications Ordered in ED Medications  LORazepam (ATIVAN) injection 1 mg (1 mg Intravenous Given 05/07/21 0824)  sodium chloride 0.9 % bolus 1,000 mL (0 mLs Intravenous Stopped 05/07/21  1001)    ED Course  I have reviewed the triage vital signs and the nursing notes.  Pertinent labs & imaging results that were available during my care of the patient were reviewed by me and considered in my medical decision making (see chart for details).    MDM Rules/Calculators/A&P                           Pt is feeling much better.  HR is better after fluids and ativan.  Pt is able to walk without any problems.  Pt is stable for d/c.  Return if worse.  Final Clinical Impression(s) / ED Diagnoses Final diagnoses:  Vertigo    Rx / DC Orders ED Discharge Orders          Ordered    meclizine (ANTIVERT) 25 MG tablet  3 times daily PRN        05/07/21 1118             Isla Pence, MD 05/07/21 1118

## 2021-05-07 NOTE — ED Notes (Signed)
ED Provider at bedside. 

## 2021-05-07 NOTE — ED Triage Notes (Signed)
Pt brought in by ems for palpitation and dizziness starting this am.

## 2021-05-07 NOTE — ED Notes (Signed)
Pt ambulated well w/out need for assistance. Pt denies dizziness or lightheadedness w/ ambulation.

## 2021-05-27 ENCOUNTER — Ambulatory Visit: Payer: Medicare Other | Admitting: Internal Medicine

## 2021-05-27 ENCOUNTER — Ambulatory Visit (INDEPENDENT_AMBULATORY_CARE_PROVIDER_SITE_OTHER): Payer: Medicare Other | Admitting: Cardiology

## 2021-05-27 ENCOUNTER — Encounter: Payer: Self-pay | Admitting: Cardiology

## 2021-05-27 ENCOUNTER — Other Ambulatory Visit: Payer: Self-pay

## 2021-05-27 VITALS — BP 156/90 | HR 81 | Ht 68.0 in | Wt 159.2 lb

## 2021-05-27 DIAGNOSIS — R0789 Other chest pain: Secondary | ICD-10-CM

## 2021-05-27 DIAGNOSIS — Z79899 Other long term (current) drug therapy: Secondary | ICD-10-CM

## 2021-05-27 DIAGNOSIS — E559 Vitamin D deficiency, unspecified: Secondary | ICD-10-CM

## 2021-05-27 DIAGNOSIS — I10 Essential (primary) hypertension: Secondary | ICD-10-CM

## 2021-05-27 DIAGNOSIS — E785 Hyperlipidemia, unspecified: Secondary | ICD-10-CM | POA: Diagnosis not present

## 2021-05-27 DIAGNOSIS — R0609 Other forms of dyspnea: Secondary | ICD-10-CM

## 2021-05-27 DIAGNOSIS — R9431 Abnormal electrocardiogram [ECG] [EKG]: Secondary | ICD-10-CM

## 2021-05-27 DIAGNOSIS — R5383 Other fatigue: Secondary | ICD-10-CM

## 2021-05-27 MED ORDER — METOPROLOL TARTRATE 100 MG PO TABS: ORAL_TABLET | ORAL | 0 refills | Status: DC

## 2021-05-27 NOTE — Progress Notes (Signed)
Cardiology Office Note:    Date:  05/30/2021   ID:  Sean Kirby, DOB [birth year], MRN [id number]  PCP:  Shirline Frees, MD  Cardiologist:  Berniece Salines, DO  Electrophysiologist:  None   Referring MD: Shirline Frees, MD   " I am experiencing chest discomfort"  History of Present Illness:    Sean Kirby is a 65 y.o. male with a hx of diabetes mellitus, hypertension, hyperlipidemia presents to establish cardiac care. He reports that he has been experiencing intermittent chest pain and palpitations.   He reports that he has been experiencing intermittent chest discomfort. He described this as a burning sensation that was midsternal with no radiation - nothing make it better or worse. He also notes that he is having intermittent palpitations.  He experienced the effects of breath and side effects reviewed.  There is also makes him significantly dizzy.  On November 22 he was seen at the emergency department for the dizziness.  This has improved.  He is also has been affecting his balance.  No other complaints at this time.  Past Medical History:  Diagnosis Date   Anxiety    Arthritis    Diabetes mellitus    GERD (gastroesophageal reflux disease)    Glaucoma    Hyperlipidemia    Hypertension    Hypoglycemia    Neuromuscular disorder ([place])    neuropathy feet    Past Surgical History:  Procedure Laterality Date   GLAUCOMA REPAIR  2006   lazer-low angle -both   HERNIA REPAIR     INGUINAL HERNIA REPAIR  12/02/2011   Procedure: HERNIA REPAIR INGUINAL ADULT;  Surgeon: Imogene Burn. Tsuei, MD;  Location: [place];  Service: General;  Laterality: Left;  left inguinal   SIGMOIDOSCOPY     TONSILLECTOMY AND ADENOIDECTOMY      Current Medications: Current Meds  Medication Sig   amLODipine (NORVASC) 5 MG tablet Take 5 mg by mouth at bedtime.   fexofenadine (ALLEGRA) 180 MG tablet Take 180 mg by mouth daily as needed for allergies.   glucose blood test strip 1  each by Other route as needed. Use as instructed   ibuprofen (ADVIL) 200 MG tablet Take 200 mg by mouth every 6 (six) hours as needed for mild pain.   latanoprost (XALATAN) 0.005 % ophthalmic solution Place 1 drop into both eyes at bedtime.   LORazepam (ATIVAN) 0.5 MG tablet Take 0.5 mg by mouth daily as needed for anxiety.   metFORMIN (GLUCOPHAGE) 850 MG tablet Take 1 tablet by mouth 2 (two) times daily with a meal.   metoprolol tartrate (LOPRESSOR) 100 MG tablet Take 2 hours prior to CT   Multiple Vitamin (MULTIVITAMIN WITH MINERALS) TABS Take 1 tablet by mouth daily.   pravastatin (PRAVACHOL) 40 MG tablet Take 40 mg by mouth daily.   quinapril-hydrochlorothiazide (ACCURETIC) 20-12.5 MG per tablet Take 1 tablet by mouth daily.   timolol (TIMOPTIC) 0.5 % ophthalmic solution Place 1 drop into both eyes daily.     Allergies:   Codeine, Septra [sulfamethoxazole-trimethoprim], Dapagliflozin, Sertraline hcl, Glimepiride, and Simvastatin   Social History   Socioeconomic History   Marital status: Single    Spouse name: Not on file   Number of children: 0   Years of education: Not on file   Highest education level: Not on file  Occupational History   Not on file  Tobacco Use   Smoking status: Never   Smokeless tobacco: Never  Vaping Use   Vaping  Use: Never used  Substance and Sexual Activity   Alcohol use: No   Drug use: No   Sexual activity: Not on file  Other Topics Concern   Not on file  Social History Narrative   Right Handed   Lives in a one story home   Does not drink caffeine   Social Determinants of Health   Financial Resource Strain: Not on file  Food Insecurity: Not on file  Transportation Needs: Not on file  Physical Activity: Not on file  Stress: Not on file  Social Connections: Not on file     Family History: The patient's family history includes Heart disease in his maternal aunt, maternal grandmother, and mother. There is no history of Colon cancer.  ROS:    Review of Systems  Constitution: Negative for decreased appetite, fever and weight gain.  HENT: Negative for congestion, ear discharge, hoarse voice and sore throat.   Eyes: Negative for discharge, redness, vision loss in right eye and visual halos.  Cardiovascular: Negative for chest pain, dyspnea on exertion, leg swelling, orthopnea and palpitations.  Respiratory: Negative for cough, hemoptysis, shortness of breath and snoring.   Endocrine: Negative for heat intolerance and polyphagia.  Hematologic/Lymphatic: Negative for bleeding problem. Does not bruise/bleed easily.  Skin: Negative for flushing, nail changes, rash and suspicious lesions.  Musculoskeletal: Negative for arthritis, joint pain, muscle cramps, myalgias, neck pain and stiffness.  Gastrointestinal: Negative for abdominal pain, bowel incontinence, diarrhea and excessive appetite.  Genitourinary: Negative for decreased libido, genital sores and incomplete emptying.  Neurological: Negative for brief paralysis, focal weakness, headaches and loss of balance.  Psychiatric/Behavioral: Negative for altered mental status, depression and suicidal ideas.  Allergic/Immunologic: Negative for HIV exposure and persistent infections.    EKGs/Labs/Other Studies Reviewed:    The following studies were reviewed today:   EKG:  The ekg ordered today demonstrates sinus rhythm, heart rate 81 bpm with nonspecific intraventricular conduction defect.   Recent Labs: 05/07/2021: ALT 20; BUN 15; Creatinine, Ser 0.89; Hemoglobin 15.8; Platelets 232; Potassium 3.9; Sodium 135; TSH 2.205  Recent Lipid Panel No results found for: CHOL, TRIG, HDL, CHOLHDL, VLDL, LDLCALC, LDLDIRECT  Physical Exam:    VS:  BP (!) 156/90 (BP Location: Right Arm)   Pulse 81   Ht 5\' 8"  (1.727 m)   Wt 159 lb 3.2 oz (72.2 kg)   SpO2 97%   BMI 24.21 kg/m     Wt Readings from Last 3 Encounters:  05/27/21 159 lb 3.2 oz (72.2 kg)  05/07/21 160 lb (72.6 kg)  08/22/20  162 lb 3.2 oz (73.6 kg)     GEN: Well nourished, well developed in no acute distress HEENT: Normal NECK: No JVD; No carotid bruits LYMPHATICS: No lymphadenopathy CARDIAC: S1S2 noted,RRR, no murmurs, rubs, gallops RESPIRATORY:  Clear to auscultation without rales, wheezing or rhonchi  ABDOMEN: Soft, non-tender, non-distended, +bowel sounds, no guarding. EXTREMITIES: No edema, No cyanosis, no clubbing MUSCULOSKELETAL:  No deformity  SKIN: Warm and dry NEUROLOGIC:  Alert and oriented x 3, non-focal PSYCHIATRIC:  Normal affect, good insight  ASSESSMENT:    1. Other chest pain   2. Fatigue, unspecified type   3. DOE (dyspnea on exertion)   4. Hyperlipidemia, unspecified hyperlipidemia type   5. Hypertension, unspecified type   6. Medication management   7. Abnormal electrocardiogram (ECG) (EKG)   8. Vitamin D deficiency, unspecified    PLAN:     1.  The symptoms chest pain is concerning, this patient does have  intermediate risk for coronary artery disease and at this time I would like to pursue an ischemic evaluation in this patient.  Shared decision a coronary CTA at this time is appropriate.  I have discussed with the patient about the testing.  The patient has no IV contrast allergy and is agreeable to proceed with this test.  2.  He tells me that at home his blood pressure is usually in the low and he takes his blood pressure daily.  Advised the patient to take his blood pressure and then send me that information.  3.  For his fatigue we will get vitamin D levels as well.  The patient is in agreement with the above plan. The patient left the office in stable condition.  The patient will follow up in   Medication Adjustments/Labs and Tests Ordered: Current medicines are reviewed at length with the patient today.  Concerns regarding medicines are outlined above.  Orders Placed This Encounter  Procedures   CT CORONARY MORPH W/CTA COR W/SCORE W/CA W/CM &/OR WO/CM   VITAMIN D 25  Hydroxy (Vit-D Deficiency, Fractures)   Basic Metabolic Panel (BMET)   Magnesium   EKG 12-Lead   Meds ordered this encounter  Medications   metoprolol tartrate (LOPRESSOR) 100 MG tablet    Sig: Take 2 hours prior to CT    Dispense:  1 tablet    Refill:  0    Patient Instructions  Medication Instructions:  Your physician recommends that you continue on your current medications as directed. Please refer to the Current Medication list given to you today.  *If you need a refill on your cardiac medications before your next appointment, please call your pharmacy*   Lab Work: Your physician recommends that you return for lab work in: 3-7 days before CT scan: BMET, Mag, Vitamin D  If you have labs (blood work) drawn today and your tests are completely normal, you will receive your results only by: [place] (if you have MyChart) OR A paper copy in the mail If you have any lab test that is abnormal or we need to change your treatment, we will call you to review the results.   Testing/Procedures:   Your cardiac CT will be scheduled at one of the below locations:   [facility] [address] [place], [place] [address] [phone]   If scheduled at [facility], please arrive at the [facility] main entrance (entrance A) of [facility] 30 minutes prior to test start time. You can use the FREE valet parking offered at the main entrance (encouraged to control the heart rate for the test) Proceed to the [facility] Radiology Department (first floor) to check-in and test prep.  Please follow these instructions carefully (unless otherwise directed):  Hold all erectile dysfunction medications at least 3 days (72 hrs) prior to test.  On the Night Before the Test: Be sure to Drink plenty of water. Do not consume any caffeinated/decaffeinated beverages or chocolate 12 hours prior to your test. Do not take any antihistamines 12 hours prior to your  test.  On the Day of the Test: Drink plenty of water until 1 hour prior to the test. Do not eat any food 4 hours prior to the test. You may take your regular medications prior to the test.  Take metoprolol (Lopressor) two hours prior to test. HOLD Hydrochlorothiazide (Accuretic) morning of the test.       After the Test: Drink plenty of water.  After receiving IV contrast, you may experience a mild flushed feeling. This is normal. On occasion, you may experience a mild rash up to 24 hours after the test. This is not dangerous. If this occurs, you can take Benadryl 25 mg and increase your fluid intake. If you experience trouble breathing, this can be serious. If it is severe call 911 IMMEDIATELY. If it is mild, please call our office. If you take any of these medications: Glipizide/Metformin, Avandament, Glucavance, please do not take 48 hours after completing test unless otherwise instructed.  Please allow 2-4 weeks for scheduling of routine cardiac CTs. Some insurance companies require a pre-authorization which may delay scheduling of this test.   For non-scheduling related questions, please contact the cardiac imaging nurse navigator should you have any questions/concerns: Marchia Bond, Cardiac Imaging Nurse Navigator Gordy Clement, Cardiac Imaging Nurse Navigator [facility] Direct Office Dial: [phone]   For scheduling needs, including cancellations and rescheduling, please call [place], [phone].    Follow-Up: At [facility], you and your health needs are our priority.  As part of our continuing mission to provide you with exceptional heart care, we have created designated Provider Care Teams.  These Care Teams include your primary Cardiologist (physician) and Advanced Practice Providers (APPs -  Physician Assistants and Nurse Practitioners) who all work together to provide you with the care you need, when you need it.  We recommend signing  up for the patient portal called "MyChart".  Sign up information is provided on this After Visit Summary.  MyChart is used to connect with patients for Virtual Visits (Telemedicine).  Patients are able to view lab/test results, encounter notes, upcoming appointments, etc.  Non-urgent messages can be sent to your provider as well.   To learn more about what you can do with MyChart, go to [email or web address].    Your next appointment:   3 month(s)  The format for your next appointment:   In Person  Provider:   Berniece Salines, DO   Other Instructions Please take your blood pressure daily for 2 weeks and send in a MyChart message.     Adopting a Healthy Lifestyle.  Know what a healthy weight is for you (roughly BMI <25) and aim to maintain this   Aim for 7+ servings of fruits and vegetables daily   65-80+ fluid ounces of water or unsweet tea for healthy kidneys   Limit to max 1 drink of alcohol per day; avoid smoking/tobacco   Limit animal fats in diet for cholesterol and heart health - choose grass fed whenever available   Avoid highly processed foods, and foods high in saturated/trans fats   Aim for low stress - take time to unwind and care for your mental health   Aim for 150 min of moderate intensity exercise weekly for heart health, and weights twice weekly for bone health   Aim for 7-9 hours of sleep daily   When it comes to diets, agreement about the perfect plan isnt easy to find, even among the experts. Experts at the [employer] developed an idea known as the Healthy Eating Plate. Just imagine a plate divided into logical, healthy portions.   The emphasis is on diet quality:   Load up on vegetables and fruits - one-half of your plate: Aim for color and variety, and remember that potatoes dont count.   Go for whole grains - one-quarter of your plate: Whole wheat, barley, wheat berries, quinoa, oats, brown  rice, and foods made with them. If you  want pasta, go with whole wheat pasta.   Protein power - one-quarter of your plate: Fish, chicken, beans, and nuts are all healthy, versatile protein sources. Limit red meat.   The diet, however, does go beyond the plate, offering a few other suggestions.   Use healthy plant oils, such as olive, canola, soy, corn, sunflower and peanut. Check the labels, and avoid partially hydrogenated oil, which have unhealthy trans fats.   If youre thirsty, drink water. Coffee and tea are good in moderation, but skip sugary drinks and limit milk and dairy products to one or two daily servings.   The type of carbohydrate in the diet is more important than the amount. Some sources of carbohydrates, such as vegetables, fruits, whole grains, and beans-are healthier than others.   Finally, stay active  Signed, Berniece Salines, DO  05/30/2021 7:36 PM    [facility]

## 2021-05-27 NOTE — Patient Instructions (Addendum)
Medication Instructions:  Your physician recommends that you continue on your current medications as directed. Please refer to the Current Medication list given to you today.  *If you need a refill on your cardiac medications before your next appointment, please call your pharmacy*   Lab Work: Your physician recommends that you return for lab work in: 3-7 days before CT scan: BMET, Mag, Vitamin D  If you have labs (blood work) drawn today and your tests are completely normal, you will receive your results only by: [place] (if you have MyChart) OR A paper copy in the mail If you have any lab test that is abnormal or we need to change your treatment, we will call you to review the results.   Testing/Procedures:   Your cardiac CT will be scheduled at one of the below locations:   [facility] [address] [place], [place] [address] [phone]   If scheduled at [facility], please arrive at the [facility] main entrance (entrance A) of [facility] 30 minutes prior to test start time. You can use the FREE valet parking offered at the main entrance (encouraged to control the heart rate for the test) Proceed to the [facility] Radiology Department (first floor) to check-in and test prep.  Please follow these instructions carefully (unless otherwise directed):  Hold all erectile dysfunction medications at least 3 days (72 hrs) prior to test.  On the Night Before the Test: Be sure to Drink plenty of water. Do not consume any caffeinated/decaffeinated beverages or chocolate 12 hours prior to your test. Do not take any antihistamines 12 hours prior to your test.  On the Day of the Test: Drink plenty of water until 1 hour prior to the test. Do not eat any food 4 hours prior to the test. You may take your regular medications prior to the test.  Take metoprolol (Lopressor) two hours prior to test. HOLD Hydrochlorothiazide (Accuretic) morning of the  test.       After the Test: Drink plenty of water. After receiving IV contrast, you may experience a mild flushed feeling. This is normal. On occasion, you may experience a mild rash up to 24 hours after the test. This is not dangerous. If this occurs, you can take Benadryl 25 mg and increase your fluid intake. If you experience trouble breathing, this can be serious. If it is severe call 911 IMMEDIATELY. If it is mild, please call our office. If you take any of these medications: Glipizide/Metformin, Avandament, Glucavance, please do not take 48 hours after completing test unless otherwise instructed.  Please allow 2-4 weeks for scheduling of routine cardiac CTs. Some insurance companies require a pre-authorization which may delay scheduling of this test.   For non-scheduling related questions, please contact the cardiac imaging nurse navigator should you have any questions/concerns: Marchia Bond, Cardiac Imaging Nurse Navigator Gordy Clement, Cardiac Imaging Nurse Navigator [facility] Direct Office Dial: [phone]   For scheduling needs, including cancellations and rescheduling, please call [place], [phone].    Follow-Up: At [facility], you and your health needs are our priority.  As part of our continuing mission to provide you with exceptional heart care, we have created designated Provider Care Teams.  These Care Teams include your primary Cardiologist (physician) and Advanced Practice Providers (APPs -  Physician Assistants and Nurse Practitioners) who all work together to provide you with the care you need, when you need it.  We recommend signing  up for the patient portal called "MyChart".  Sign up information is provided on this After Visit Summary.  MyChart is used to connect with patients for Virtual Visits (Telemedicine).  Patients are able to view lab/test results, encounter notes, upcoming appointments, etc.  Non-urgent messages can be  sent to your provider as well.   To learn more about what you can do with MyChart, go to [email or web address].    Your next appointment:   3 month(s)  The format for your next appointment:   In Person  Provider:   Berniece Salines, DO   Other Instructions Please take your blood pressure daily for 2 weeks and send in a MyChart message.

## 2021-06-06 ENCOUNTER — Telehealth (HOSPITAL_COMMUNITY): Payer: Self-pay | Admitting: Emergency Medicine

## 2021-06-06 NOTE — Telephone Encounter (Signed)
Reaching out to patient to offer assistance regarding upcoming cardiac imaging study; pt verbalizes understanding of appt date/time, parking situation and where to check in, pre-test NPO status and medications ordered, and verified current allergies; name and call back number provided for further questions should they arise Sean Bond RN [place] [facility] [phone] office [phone] cell  Arrival 1130 Reminded to get labs (informed offices closed on Monday) Holding accretic Taking metoprolol 2 hr prior to scan

## 2021-06-08 LAB — BASIC METABOLIC PANEL
BUN/Creatinine Ratio: 15 (ref 10–24)
BUN: 13 mg/dL (ref 8–27)
CO2: 27 mmol/L (ref 20–29)
Calcium: 10.1 mg/dL (ref 8.6–10.2)
Chloride: 97 mmol/L (ref 96–106)
Creatinine, Ser: 0.88 mg/dL (ref 0.76–1.27)
Glucose: 137 mg/dL — ABNORMAL HIGH (ref 70–99)
Potassium: 4.4 mmol/L (ref 3.5–5.2)
Sodium: 137 mmol/L (ref 134–144)
eGFR: 94 mL/min/{1.73_m2} (ref >59)

## 2021-06-08 LAB — VITAMIN D 25 HYDROXY (VIT D DEFICIENCY, FRACTURES): Vit D, 25-Hydroxy: 53.8 ng/mL (ref 30.0–100.0)

## 2021-06-08 LAB — MAGNESIUM: Magnesium: 2.1 mg/dL (ref 1.6–2.3)

## 2021-06-12 ENCOUNTER — Ambulatory Visit (HOSPITAL_COMMUNITY)
Admission: RE | Admit: 2021-06-12 | Discharge: 2021-06-12 | Disposition: A | Discharge status: Home / Self Care | Payer: Medicare Other | Source: Ambulatory Visit | Attending: Cardiology | Admitting: Cardiology

## 2021-06-12 ENCOUNTER — Other Ambulatory Visit: Payer: Self-pay

## 2021-06-12 DIAGNOSIS — I2510 Atherosclerotic heart disease of native coronary artery without angina pectoris: Secondary | ICD-10-CM | POA: Diagnosis not present

## 2021-06-12 DIAGNOSIS — R0789 Other chest pain: Secondary | ICD-10-CM | POA: Diagnosis present

## 2021-06-12 DIAGNOSIS — R9431 Abnormal electrocardiogram [ECG] [EKG]: Secondary | ICD-10-CM | POA: Insufficient documentation

## 2021-06-12 MED ORDER — IOHEXOL 350 MG/ML SOLN
100.0000 mL | Freq: Once | INTRAVENOUS | Status: AC | PRN
  Administered 2021-06-12: 12:00:00 100 mL via INTRAVENOUS

## 2021-06-12 MED ORDER — NITROGLYCERIN 0.4 MG SL SUBL
SUBLINGUAL_TABLET | SUBLINGUAL | Status: AC
  Filled 2021-06-12: qty 2

## 2021-06-12 MED ORDER — NITROGLYCERIN 0.4 MG SL SUBL
0.8000 mg | SUBLINGUAL_TABLET | Freq: Once | SUBLINGUAL | Status: AC
  Administered 2021-06-12: 12:00:00 0.8 mg via SUBLINGUAL

## 2021-06-12 NOTE — Progress Notes (Signed)
CT scan completed. Tolerated well. D/C home in wheelchair, awake and alert. In no distress 

## 2021-06-13 ENCOUNTER — Ambulatory Visit (INDEPENDENT_AMBULATORY_CARE_PROVIDER_SITE_OTHER): Payer: Medicare Other | Admitting: Cardiology

## 2021-06-13 ENCOUNTER — Encounter: Payer: Self-pay | Admitting: Cardiology

## 2021-06-13 VITALS — BP 134/81 | HR 70 | Ht 68.0 in | Wt 158.8 lb

## 2021-06-13 DIAGNOSIS — Z79899 Other long term (current) drug therapy: Secondary | ICD-10-CM

## 2021-06-13 DIAGNOSIS — I2510 Atherosclerotic heart disease of native coronary artery without angina pectoris: Secondary | ICD-10-CM

## 2021-06-13 DIAGNOSIS — I10 Essential (primary) hypertension: Secondary | ICD-10-CM | POA: Diagnosis not present

## 2021-06-13 DIAGNOSIS — E0800 Diabetes mellitus due to underlying condition with hyperosmolarity without nonketotic hyperglycemic-hyperosmolar coma (NKHHC): Secondary | ICD-10-CM

## 2021-06-13 MED ORDER — ROSUVASTATIN CALCIUM 20 MG PO TABS: 20.0000 mg | ORAL_TABLET | Freq: Every day | ORAL | 3 refills | Status: DC

## 2021-06-13 MED ORDER — ISOSORBIDE MONONITRATE ER 30 MG PO TB24: 30.0000 mg | ORAL_TABLET | Freq: Every day | ORAL | 3 refills | Status: DC

## 2021-06-13 NOTE — H&P (View-Only) (Signed)
Cardiology Office Note:    Date:  06/13/2021   ID:  Sean Kirby, DOB [birth year], MRN [id number]  PCP:  Shirline Frees, MD  Cardiologist:  Berniece Salines, DO  Electrophysiologist:  None   Referring MD: Shirline Frees, MD   " I am having intermittent   History of Present Illness:    Sean Kirby is a 65 y.o. male with a hx of diabetes mellitus, hypertension, hyperlipidemia presents today for follow-up visit.  First saw the patient on May 27, 2021 at that time he was experiencing intermittent chest discomfort and palpitations.  I recommended a coronary CTA to the patient given his risk factors.  He was agreeable.  He was hypertensive in the office that day but he gave me his blood pressure information from home which was lower than his presentation.  Therefore no antihypertensive medication was adjusted.  Is here today he had his coronary CTA yesterday which showed significant severe CAD with an occlusion in the left circumflex artery.    Past Medical History:  Diagnosis Date   Anxiety    Arthritis    Diabetes mellitus    GERD (gastroesophageal reflux disease)    Glaucoma    Hyperlipidemia    Hypertension    Hypoglycemia    Neuromuscular disorder (Sean Kirby)    neuropathy feet    Past Surgical History:  Procedure Laterality Date   GLAUCOMA REPAIR  2006   lazer-low angle -both   HERNIA REPAIR     INGUINAL HERNIA REPAIR  12/02/2011   Procedure: HERNIA REPAIR INGUINAL ADULT;  Surgeon: Imogene Burn. Tsuei, MD;  Location: [facility];  Service: General;  Laterality: Left;  left inguinal   SIGMOIDOSCOPY     TONSILLECTOMY AND ADENOIDECTOMY      Current Medications: Current Meds  Medication Sig   amLODipine (NORVASC) 5 MG tablet Take 5 mg by mouth at bedtime.   fexofenadine (ALLEGRA) 180 MG tablet Take 180 mg by mouth daily as needed for allergies.   glucose blood test strip 1 each by Other route as needed. Use as instructed   ibuprofen (ADVIL) 200 MG  tablet Take 200 mg by mouth every 6 (six) hours as needed for mild pain.   isosorbide mononitrate (IMDUR) 30 MG 24 hr tablet Take 1 tablet (30 mg total) by mouth daily.   latanoprost (XALATAN) 0.005 % ophthalmic solution Place 1 drop into both eyes at bedtime.   LORazepam (ATIVAN) 0.5 MG tablet Take 0.5 mg by mouth daily as needed for anxiety.   meclizine (ANTIVERT) 25 MG tablet Take 1 tablet (25 mg total) by mouth 3 (three) times daily as needed for dizziness.   metFORMIN (GLUCOPHAGE) 850 MG tablet Take 1 tablet by mouth 2 (two) times daily with a meal.   Multiple Vitamin (MULTIVITAMIN WITH MINERALS) TABS Take 1 tablet by mouth daily.   quinapril-hydrochlorothiazide (ACCURETIC) 20-12.5 MG per tablet Take 1 tablet by mouth daily.   rosuvastatin (CRESTOR) 20 MG tablet Take 1 tablet (20 mg total) by mouth daily.   timolol (TIMOPTIC) 0.5 % ophthalmic solution Place 1 drop into both eyes daily.   [DISCONTINUED] pravastatin (PRAVACHOL) 40 MG tablet Take 40 mg by mouth daily.     Allergies:   Codeine, Septra [sulfamethoxazole-trimethoprim], Dapagliflozin, Sertraline hcl, Glimepiride, and Simvastatin   Social History   Socioeconomic History   Marital status: Single    Spouse name: Not on file   Number of children: 0   Years of education: Not on file  Highest education level: Not on file  Occupational History   Not on file  Tobacco Use   Smoking status: Never   Smokeless tobacco: Never  Vaping Use   Vaping Use: Never used  Substance and Sexual Activity   Alcohol use: No   Drug use: No   Sexual activity: Not on file  Other Topics Concern   Not on file  Social History Narrative   Right Handed   Lives in a one story home   Does not drink caffeine   Social Determinants of Health   Financial Resource Strain: Not on file  Food Insecurity: Not on file  Transportation Needs: Not on file  Physical Activity: Not on file  Stress: Not on file  Social Connections: Not on file      Family History: The patient's family history includes Heart disease in his maternal aunt, maternal grandmother, and mother. There is no history of Colon cancer.  ROS:   Review of Systems  Constitution: Negative for decreased appetite, fever and weight gain.  HENT: Negative for congestion, ear discharge, hoarse voice and sore throat.   Eyes: Negative for discharge, redness, vision loss in right eye and visual halos.  Cardiovascular: Negative for chest pain, dyspnea on exertion, leg swelling, orthopnea and palpitations.  Respiratory: Negative for cough, hemoptysis, shortness of breath and snoring.   Endocrine: Negative for heat intolerance and polyphagia.  Hematologic/Lymphatic: Negative for bleeding problem. Does not bruise/bleed easily.  Skin: Negative for flushing, nail changes, rash and suspicious lesions.  Musculoskeletal: Negative for arthritis, joint pain, muscle cramps, myalgias, neck pain and stiffness.  Gastrointestinal: Negative for abdominal pain, bowel incontinence, diarrhea and excessive appetite.  Genitourinary: Negative for decreased libido, genital sores and incomplete emptying.  Neurological: Negative for brief paralysis, focal weakness, headaches and loss of balance.  Psychiatric/Behavioral: Negative for altered mental status, depression and suicidal ideas.  Allergic/Immunologic: Negative for HIV exposure and persistent infections.    EKGs/Labs/Other Studies Reviewed:    The following studies were reviewed today:   EKG:  none today   CCTA 06/12/2021 Aorta: Normal size.  No calcifications.  No dissection.   Aortic Valve:  Trileaflet.  No calcifications.   Coronary Arteries:  Normal coronary origin.  Right dominance.   RCA is a large dominant artery that gives rise to PDA and PLA. There is a mild (25-49%) mixed plaque in the proximal RCA. The mid RCA with minimal (<24% calcified plaques. The distal RCA with mild (25-49%) calcified plaques. The PDA with a  focal mild calcified plaque.   Left main is a large artery that gives rise to LAD, Intermedius ramus and LCX arteries.   LAD is a large vessel. The proximal LAD with diffuse calcification- there is a mild calcified plaque, below this plaque appears to be a mixed lesion that is suspected to be severe (70-99%). The proximal to mid LAD with another mixed lesion suspected to be severe (70-99%). The mid and distal LAD with no plaques.   Intermedius Ramus - the proximal portion of the vessel with a moderate (50-69%) calcified plaques. Below this is a eccentric soft plaque that is suspected to be moderate. The proximal to mid portion of the vessel with a mild calcified plaque.   LCX is a non-dominant artery that gives rise to one OM1 branch. There is a two separate mild calcified plaques in the proximal LCX. At the proximal to mid portion of the vessel right below the ostium of the OM1 vessel, the LCX appears to  be occluded. OM1 is a small caliber vessel with minimal calcification in the proximal portion of the vessel.   Coronary Calcium Score:   Left main: 0   Left anterior descending artery: 464   Left circumflex artery: 117   Right coronary artery: 966   Total: 1547   Percentile: 94   Other findings:   Normal pulmonary vein drainage into the left atrium.   Normal left atrial appendage without a thrombus.   Normal size of the pulmonary artery.   Very small Patent foramen ovale with trivial right to left shunt.   IMPRESSION: 1. Coronary calcium score of 1547. This was 67 percentile for age and sex matched control.   2. Severe coronary artery disease. CAD-RADS 4 Severe stenosis. (70-99% or > 50% left main). Cardiac catheterization or CT FFR is recommended. Consider symptom-guided anti-ischemic pharmacotherapy as well as risk factor modification per guideline directed care. Study will be sent for FFR.   3. Normal coronary origin with right dominance.   4.  Small  Patent foreman ovale.   Electronically Signed   By: Berniece Salines D.O.   On: 06/12/2021 19:14  Recent Labs: 05/07/2021: ALT 20; Hemoglobin 15.8; Platelets 232; TSH 2.205 06/07/2021: BUN 13; Creatinine, Ser 0.88; Magnesium 2.1; Potassium 4.4; Sodium 137  Recent Lipid Panel No results found for: CHOL, TRIG, HDL, CHOLHDL, VLDL, LDLCALC, LDLDIRECT  Physical Exam:    VS:  BP 134/81 (BP Location: Left Arm, Patient Position: Sitting)    Pulse 70    Ht 5\' 8"  (1.727 m)    Wt 158 lb 12.8 oz (72 kg)    SpO2 97%    BMI 24.15 kg/m     Wt Readings from Last 3 Encounters:  06/13/21 158 lb 12.8 oz (72 kg)  05/27/21 159 lb 3.2 oz (72.2 kg)  05/07/21 160 lb (72.6 kg)     GEN: Well nourished, well developed in no acute distress HEENT: Normal NECK: No JVD; No carotid bruits LYMPHATICS: No lymphadenopathy CARDIAC: S1S2 noted,RRR, no murmurs, rubs, gallops RESPIRATORY:  Clear to auscultation without rales, wheezing or rhonchi  ABDOMEN: Soft, non-tender, non-distended, +bowel sounds, no guarding. EXTREMITIES: No edema, No cyanosis, no clubbing MUSCULOSKELETAL:  No deformity  SKIN: Warm and dry NEUROLOGIC:  Alert and oriented x 3, non-focal PSYCHIATRIC:  Normal affect, good insight  ASSESSMENT:    1. Coronary artery disease involving native coronary artery of native heart, unspecified whether angina present   2. Diabetes mellitus due to underlying condition with hyperosmolarity without coma, without long-term current use of insulin ([place])   3. Hypertension, unspecified type   4. Medication management    PLAN:     We had a very long discussion about his coronary CT scan result.  He was able to pull out his images and shared this with the patient including his FFR result as well.  I did talk to the patient that the next best step is pursuing left heart catheterization-shared with him the potential outcomes.  He is agreeable to proceed with his heart catheterization.  In the meantime we will start  aspirin 81 mg daily, I am going to stop his pravastatin and start him on Crestor 20 mg daily he has had reaction to Lipitor but not tried Crestor in the past.  In addition he still is having intermittent chest discomfort going to start Imdur 30 mg daily.  The patient understands that risks include but are not limited to stroke (1 in 1000), death (1 in 35), kidney  failure [usually temporary] (1 in 500), bleeding (1 in 200), allergic reaction [possibly serious] (1 in 200), and agrees to proceed.  Hyperlipidemia - continue with current statin medication.  She is hypertensive again in the office, at the last visit he did mention that his blood pressure at home is within normal limits.  I am not so sure if his blood pressure is well controlled.  But for right now I am starting Imdur for his antianginal effect hopefully this will also help with his blood pressure evaluation.  If not I will see the patient back we will  The patient is in agreement with the above plan. The patient left the office in stable condition.  The patient will follow up in 4 weeks.   Medication Adjustments/Labs and Tests Ordered: Current medicines are reviewed at length with the patient today.  Concerns regarding medicines are outlined above.  Orders Placed This Encounter  Procedures   Basic Metabolic Panel (BMET)   CBC with Differential/Platelet   Magnesium   Meds ordered this encounter  Medications   rosuvastatin (CRESTOR) 20 MG tablet    Sig: Take 1 tablet (20 mg total) by mouth daily.    Dispense:  90 tablet    Refill:  3   isosorbide mononitrate (IMDUR) 30 MG 24 hr tablet    Sig: Take 1 tablet (30 mg total) by mouth daily.    Dispense:  90 tablet    Refill:  3    Patient Instructions  Medication Instructions:  Your physician has recommended you make the following change in your medication:  STOP: Pravastatin  START: Crestor 20 mg once daily START: Imdur 30 mg once daily *If you need a refill on your  cardiac medications before your next appointment, please call your pharmacy*   Lab Work: Your physician recommends that you return for lab work in:  TODAY: BMET, Mag, CBC If you have labs (blood work) drawn today and your tests are completely normal, you will receive your results only by: MyChart Message (if you have MyChart) OR A paper copy in the mail If you have any lab test that is abnormal or we need to change your treatment, we will call you to review the results.   Testing/Procedures:  [facility] [place] [address] Dept: [phone] Loc: [phone]  TOBBY FAWCETT  06/13/2021  You are scheduled for a Cardiac Catheterization on Tuesday, January 3 with Dr. Daneen Schick.  1. Please arrive at the [facility] (Main Entrance A) at [facility]: [address] [place], [place] [address] at 7:00 AM (This time is two hours before your procedure to ensure your preparation). Free valet parking service is available.   Special note: Every effort is made to have your procedure done on time. Please understand that emergencies sometimes delay scheduled procedures.  2. Diet: Do not eat solid foods after midnight.  The patient may have clear liquids until 5am upon the day of the procedure.  3. Labs: You will need to have blood drawn on TODAY.  4. Medication instructions in preparation for your procedure:   Contrast Allergy: No  Do not take Diabetes Med Glucophage (Metformin) on the day of the procedure and HOLD 48 HOURS AFTER THE PROCEDURE.  On the morning of your procedure, take your Aspirin and any morning medicines NOT listed above.  You may use sips of water.  5. Plan for one night stay--bring personal  belongings. 6. Bring a current list of your medications and current insurance cards. 7. You MUST have a responsible person to drive you home. 8. Someone MUST be with  you the first 24 hours after you arrive home or your discharge will be delayed. 9. Please wear clothes that are easy to get on and off and wear slip-on shoes.  Thank you for allowing [place] to care for you!   -- [facility]    Follow-Up: At [facility], you and your health needs are our priority.  As part of our continuing mission to provide you with exceptional heart care, we have created designated Provider Care Teams.  These Care Teams include your primary Cardiologist (physician) and Advanced Practice Providers (APPs -  Physician Assistants and Nurse Practitioners) who all work together to provide you with the care you need, when you need it.  We recommend signing up for the patient portal called "MyChart".  Sign up information is provided on this After Visit Summary.  MyChart is used to connect with patients for Virtual Visits (Telemedicine).  Patients are able to view lab/test results, encounter notes, upcoming appointments, etc.  Non-urgent messages can be sent to your provider as well.   To learn more about what you can do with MyChart, go to [email or web address].    Your next appointment:   2-4 week(s) after cath  The format for your next appointment:   In Person  Provider:   Berniece Salines, DO     Other Instructions     Adopting a Healthy Lifestyle.  Know what a healthy weight is for you (roughly BMI <25) and aim to maintain this   Aim for 7+ servings of fruits and vegetables daily   65-80+ fluid ounces of water or unsweet tea for healthy kidneys   Limit to max 1 drink of alcohol per day; avoid smoking/tobacco   Limit animal fats in diet for cholesterol and heart health - choose grass fed whenever available   Avoid highly processed foods, and foods high in saturated/trans fats   Aim for low stress - take time to unwind and care for your mental health   Aim for 150 min of moderate intensity exercise weekly for heart health, and  weights twice weekly for bone health   Aim for 7-9 hours of sleep daily   When it comes to diets, agreement about the perfect plan isnt easy to find, even among the experts. Experts at the [employer] developed an idea known as the Healthy Eating Plate. Just imagine a plate divided into logical, healthy portions.   The emphasis is on diet quality:   Load up on vegetables and fruits - one-half of your plate: Aim for color and variety, and remember that potatoes dont count.   Go for whole grains - one-quarter of your plate: Whole wheat, barley, wheat berries, quinoa, oats, brown rice, and foods made with them. If you want pasta, go with whole wheat pasta.   Protein power - one-quarter of your plate: Fish, chicken, beans, and nuts are all healthy, versatile protein sources. Limit red meat.   The diet, however, does go beyond the plate, offering a few other suggestions.   Use healthy plant oils, such as olive, canola, soy, corn, sunflower and peanut. Check the labels, and avoid partially hydrogenated oil, which have unhealthy trans fats.   If youre thirsty, drink water. Coffee and tea are good in moderation, but skip sugary drinks and limit milk  and dairy products to one or two daily servings.   The type of carbohydrate in the diet is more important than the amount. Some sources of carbohydrates, such as vegetables, fruits, whole grains, and beans-are healthier than others.   Finally, stay active  Signed, Berniece Salines, DO  06/13/2021 3:03 PM    [facility]

## 2021-06-13 NOTE — Progress Notes (Signed)
Cardiology Office Note:    Date:  06/13/2021   ID:  Sean Kirby, DOB [birth year], MRN [id number]  PCP:  Shirline Frees, MD  Cardiologist:  Berniece Salines, DO  Electrophysiologist:  None   Referring MD: Shirline Frees, MD   " I am having intermittent   History of Present Illness:    Sean Kirby is a 65 y.o. male with a hx of diabetes mellitus, hypertension, hyperlipidemia presents today for follow-up visit.  First saw the patient on May 27, 2021 at that time he was experiencing intermittent chest discomfort and palpitations.  I recommended a coronary CTA to the patient given his risk factors.  He was agreeable.  He was hypertensive in the office that day but he gave me his blood pressure information from home which was lower than his presentation.  Therefore no antihypertensive medication was adjusted.  Is here today he had his coronary CTA yesterday which showed significant severe CAD with an occlusion in the left circumflex artery.    Past Medical History:  Diagnosis Date   Anxiety    Arthritis    Diabetes mellitus    GERD (gastroesophageal reflux disease)    Glaucoma    Hyperlipidemia    Hypertension    Hypoglycemia    Neuromuscular disorder ([place])    neuropathy feet    Past Surgical History:  Procedure Laterality Date   GLAUCOMA REPAIR  2006   lazer-low angle -both   HERNIA REPAIR     INGUINAL HERNIA REPAIR  12/02/2011   Procedure: HERNIA REPAIR INGUINAL ADULT;  Surgeon: Imogene Burn. Tsuei, MD;  Location: [facility];  Service: General;  Laterality: Left;  left inguinal   SIGMOIDOSCOPY     TONSILLECTOMY AND ADENOIDECTOMY      Current Medications: Current Meds  Medication Sig   amLODipine (NORVASC) 5 MG tablet Take 5 mg by mouth at bedtime.   fexofenadine (ALLEGRA) 180 MG tablet Take 180 mg by mouth daily as needed for allergies.   glucose blood test strip 1 each by Other route as needed. Use as instructed   ibuprofen (ADVIL) 200 MG  tablet Take 200 mg by mouth every 6 (six) hours as needed for mild pain.   isosorbide mononitrate (IMDUR) 30 MG 24 hr tablet Take 1 tablet (30 mg total) by mouth daily.   latanoprost (XALATAN) 0.005 % ophthalmic solution Place 1 drop into both eyes at bedtime.   LORazepam (ATIVAN) 0.5 MG tablet Take 0.5 mg by mouth daily as needed for anxiety.   meclizine (ANTIVERT) 25 MG tablet Take 1 tablet (25 mg total) by mouth 3 (three) times daily as needed for dizziness.   metFORMIN (GLUCOPHAGE) 850 MG tablet Take 1 tablet by mouth 2 (two) times daily with a meal.   Multiple Vitamin (MULTIVITAMIN WITH MINERALS) TABS Take 1 tablet by mouth daily.   quinapril-hydrochlorothiazide (ACCURETIC) 20-12.5 MG per tablet Take 1 tablet by mouth daily.   rosuvastatin (CRESTOR) 20 MG tablet Take 1 tablet (20 mg total) by mouth daily.   timolol (TIMOPTIC) 0.5 % ophthalmic solution Place 1 drop into both eyes daily.   [DISCONTINUED] pravastatin (PRAVACHOL) 40 MG tablet Take 40 mg by mouth daily.     Allergies:   Codeine, Septra [sulfamethoxazole-trimethoprim], Dapagliflozin, Sertraline hcl, Glimepiride, and Simvastatin   Social History   Socioeconomic History   Marital status: Single    Spouse name: Not on file   Number of children: 0   Years of education: Not on file  Highest education level: Not on file  Occupational History   Not on file  Tobacco Use   Smoking status: Never   Smokeless tobacco: Never  Vaping Use   Vaping Use: Never used  Substance and Sexual Activity   Alcohol use: No   Drug use: No   Sexual activity: Not on file  Other Topics Concern   Not on file  Social History Narrative   Right Handed   Lives in a one story home   Does not drink caffeine   Social Determinants of Health   Financial Resource Strain: Not on file  Food Insecurity: Not on file  Transportation Needs: Not on file  Physical Activity: Not on file  Stress: Not on file  Social Connections: Not on file      Family History: The patient's family history includes Heart disease in his maternal aunt, maternal grandmother, and mother. There is no history of Colon cancer.  ROS:   Review of Systems  Constitution: Negative for decreased appetite, fever and weight gain.  HENT: Negative for congestion, ear discharge, hoarse voice and sore throat.   Eyes: Negative for discharge, redness, vision loss in right eye and visual halos.  Cardiovascular: Negative for chest pain, dyspnea on exertion, leg swelling, orthopnea and palpitations.  Respiratory: Negative for cough, hemoptysis, shortness of breath and snoring.   Endocrine: Negative for heat intolerance and polyphagia.  Hematologic/Lymphatic: Negative for bleeding problem. Does not bruise/bleed easily.  Skin: Negative for flushing, nail changes, rash and suspicious lesions.  Musculoskeletal: Negative for arthritis, joint pain, muscle cramps, myalgias, neck pain and stiffness.  Gastrointestinal: Negative for abdominal pain, bowel incontinence, diarrhea and excessive appetite.  Genitourinary: Negative for decreased libido, genital sores and incomplete emptying.  Neurological: Negative for brief paralysis, focal weakness, headaches and loss of balance.  Psychiatric/Behavioral: Negative for altered mental status, depression and suicidal ideas.  Allergic/Immunologic: Negative for HIV exposure and persistent infections.    EKGs/Labs/Other Studies Reviewed:    The following studies were reviewed today:   EKG:  none today   CCTA 06/12/2021 Aorta: Normal size.  No calcifications.  No dissection.   Aortic Valve:  Trileaflet.  No calcifications.   Coronary Arteries:  Normal coronary origin.  Right dominance.   RCA is a large dominant artery that gives rise to PDA and PLA. There is a mild (25-49%) mixed plaque in the proximal RCA. The mid RCA with minimal (<24% calcified plaques. The distal RCA with mild (25-49%) calcified plaques. The PDA with a  focal mild calcified plaque.   Left main is a large artery that gives rise to LAD, Intermedius ramus and LCX arteries.   LAD is a large vessel. The proximal LAD with diffuse calcification- there is a mild calcified plaque, below this plaque appears to be a mixed lesion that is suspected to be severe (70-99%). The proximal to mid LAD with another mixed lesion suspected to be severe (70-99%). The mid and distal LAD with no plaques.   Intermedius Ramus - the proximal portion of the vessel with a moderate (50-69%) calcified plaques. Below this is a eccentric soft plaque that is suspected to be moderate. The proximal to mid portion of the vessel with a mild calcified plaque.   LCX is a non-dominant artery that gives rise to one OM1 branch. There is a two separate mild calcified plaques in the proximal LCX. At the proximal to mid portion of the vessel right below the ostium of the OM1 vessel, the LCX appears to  be occluded. OM1 is a small caliber vessel with minimal calcification in the proximal portion of the vessel.   Coronary Calcium Score:   Left main: 0   Left anterior descending artery: 464   Left circumflex artery: 117   Right coronary artery: 966   Total: 1547   Percentile: 94   Other findings:   Normal pulmonary vein drainage into the left atrium.   Normal left atrial appendage without a thrombus.   Normal size of the pulmonary artery.   Very small Patent foramen ovale with trivial right to left shunt.   IMPRESSION: 1. Coronary calcium score of 1547. This was 31 percentile for age and sex matched control.   2. Severe coronary artery disease. CAD-RADS 4 Severe stenosis. (70-99% or > 50% left main). Cardiac catheterization or CT FFR is recommended. Consider symptom-guided anti-ischemic pharmacotherapy as well as risk factor modification per guideline directed care. Study will be sent for FFR.   3. Normal coronary origin with right dominance.   4.  Small  Patent foreman ovale.   Electronically Signed   By: Berniece Salines D.O.   On: 06/12/2021 19:14  Recent Labs: 05/07/2021: ALT 20; Hemoglobin 15.8; Platelets 232; TSH 2.205 06/07/2021: BUN 13; Creatinine, Ser 0.88; Magnesium 2.1; Potassium 4.4; Sodium 137  Recent Lipid Panel No results found for: CHOL, TRIG, HDL, CHOLHDL, VLDL, LDLCALC, LDLDIRECT  Physical Exam:    VS:  BP 134/81 (BP Location: Left Arm, Patient Position: Sitting)    Pulse 70    Ht 5\' 8"  (1.727 m)    Wt 158 lb 12.8 oz (72 kg)    SpO2 97%    BMI 24.15 kg/m     Wt Readings from Last 3 Encounters:  06/13/21 158 lb 12.8 oz (72 kg)  05/27/21 159 lb 3.2 oz (72.2 kg)  05/07/21 160 lb (72.6 kg)     GEN: Well nourished, well developed in no acute distress HEENT: Normal NECK: No JVD; No carotid bruits LYMPHATICS: No lymphadenopathy CARDIAC: S1S2 noted,RRR, no murmurs, rubs, gallops RESPIRATORY:  Clear to auscultation without rales, wheezing or rhonchi  ABDOMEN: Soft, non-tender, non-distended, +bowel sounds, no guarding. EXTREMITIES: No edema, No cyanosis, no clubbing MUSCULOSKELETAL:  No deformity  SKIN: Warm and dry NEUROLOGIC:  Alert and oriented x 3, non-focal PSYCHIATRIC:  Normal affect, good insight  ASSESSMENT:    1. Coronary artery disease involving native coronary artery of native heart, unspecified whether angina present   2. Diabetes mellitus due to underlying condition with hyperosmolarity without coma, without long-term current use of insulin ([place])   3. Hypertension, unspecified type   4. Medication management    PLAN:     We had a very long discussion about his coronary CT scan result.  He was able to pull out his images and shared this with the patient including his FFR result as well.  I did talk to the patient that the next best step is pursuing left heart catheterization-shared with him the potential outcomes.  He is agreeable to proceed with his heart catheterization.  In the meantime we will start  aspirin 81 mg daily, I am going to stop his pravastatin and start him on Crestor 20 mg daily he has had reaction to Lipitor but not tried Crestor in the past.  In addition he still is having intermittent chest discomfort going to start Imdur 30 mg daily.  The patient understands that risks include but are not limited to stroke (1 in 1000), death (1 in 60), kidney  failure [usually temporary] (1 in 500), bleeding (1 in 200), allergic reaction [possibly serious] (1 in 200), and agrees to proceed.  Hyperlipidemia - continue with current statin medication.  She is hypertensive again in the office, at the last visit he did mention that his blood pressure at home is within normal limits.  I am not so sure if his blood pressure is well controlled.  But for right now I am starting Imdur for his antianginal effect hopefully this will also help with his blood pressure evaluation.  If not I will see the patient back we will  The patient is in agreement with the above plan. The patient left the office in stable condition.  The patient will follow up in 4 weeks.   Medication Adjustments/Labs and Tests Ordered: Current medicines are reviewed at length with the patient today.  Concerns regarding medicines are outlined above.  Orders Placed This Encounter  Procedures   Basic Metabolic Panel (BMET)   CBC with Differential/Platelet   Magnesium   Meds ordered this encounter  Medications   rosuvastatin (CRESTOR) 20 MG tablet    Sig: Take 1 tablet (20 mg total) by mouth daily.    Dispense:  90 tablet    Refill:  3   isosorbide mononitrate (IMDUR) 30 MG 24 hr tablet    Sig: Take 1 tablet (30 mg total) by mouth daily.    Dispense:  90 tablet    Refill:  3    Patient Instructions  Medication Instructions:  Your physician has recommended you make the following change in your medication:  STOP: Pravastatin  START: Crestor 20 mg once daily START: Imdur 30 mg once daily *If you need a refill on your  cardiac medications before your next appointment, please call your pharmacy*   Lab Work: Your physician recommends that you return for lab work in:  TODAY: BMET, Mag, CBC If you have labs (blood work) drawn today and your tests are completely normal, you will receive your results only by: MyChart Message (if you have MyChart) OR A paper copy in the mail If you have any lab test that is abnormal or we need to change your treatment, we will call you to review the results.   Testing/Procedures:  [facility] [address] [place] [address] Dept: [phone] Loc: [phone]  OSHEA PERCIVAL  06/13/2021  You are scheduled for a Cardiac Catheterization on Tuesday, January 3 with Dr. Daneen Schick.  1. Please arrive at the [facility] (Main Entrance A) at [facility]: [address] [place], [place] [address] at 7:00 AM (This time is two hours before your procedure to ensure your preparation). Free valet parking service is available.   Special note: Every effort is made to have your procedure done on time. Please understand that emergencies sometimes delay scheduled procedures.  2. Diet: Do not eat solid foods after midnight.  The patient may have clear liquids until 5am upon the day of the procedure.  3. Labs: You will need to have blood drawn on TODAY.  4. Medication instructions in preparation for your procedure:   Contrast Allergy: No  Do not take Diabetes Med Glucophage (Metformin) on the day of the procedure and HOLD 48 HOURS AFTER THE PROCEDURE.  On the morning of your procedure, take your Aspirin and any morning medicines NOT listed above.  You may use sips of water.  5. Plan for one night stay--bring personal  belongings. 6. Bring a current list of your medications and current insurance cards. 7. You MUST have a responsible person to drive you home. 8. Someone MUST be with  you the first 24 hours after you arrive home or your discharge will be delayed. 9. Please wear clothes that are easy to get on and off and wear slip-on shoes.  Thank you for allowing [place] to care for you!   -- [facility]    Follow-Up: At [facility], you and your health needs are our priority.  As part of our continuing mission to provide you with exceptional heart care, we have created designated Provider Care Teams.  These Care Teams include your primary Cardiologist (physician) and Advanced Practice Providers (APPs -  Physician Assistants and Nurse Practitioners) who all work together to provide you with the care you need, when you need it.  We recommend signing up for the patient portal called "MyChart".  Sign up information is provided on this After Visit Summary.  MyChart is used to connect with patients for Virtual Visits (Telemedicine).  Patients are able to view lab/test results, encounter notes, upcoming appointments, etc.  Non-urgent messages can be sent to your provider as well.   To learn more about what you can do with MyChart, go to [email or web address].    Your next appointment:   2-4 week(s) after cath  The format for your next appointment:   In Person  Provider:   Berniece Salines, DO     Other Instructions     Adopting a Healthy Lifestyle.  Know what a healthy weight is for you (roughly BMI <25) and aim to maintain this   Aim for 7+ servings of fruits and vegetables daily   65-80+ fluid ounces of water or unsweet tea for healthy kidneys   Limit to max 1 drink of alcohol per day; avoid smoking/tobacco   Limit animal fats in diet for cholesterol and heart health - choose grass fed whenever available   Avoid highly processed foods, and foods high in saturated/trans fats   Aim for low stress - take time to unwind and care for your mental health   Aim for 150 min of moderate intensity exercise weekly for heart health, and  weights twice weekly for bone health   Aim for 7-9 hours of sleep daily   When it comes to diets, agreement about the perfect plan isnt easy to find, even among the experts. Experts at the [employer] developed an idea known as the Healthy Eating Plate. Just imagine a plate divided into logical, healthy portions.   The emphasis is on diet quality:   Load up on vegetables and fruits - one-half of your plate: Aim for color and variety, and remember that potatoes dont count.   Go for whole grains - one-quarter of your plate: Whole wheat, barley, wheat berries, quinoa, oats, brown rice, and foods made with them. If you want pasta, go with whole wheat pasta.   Protein power - one-quarter of your plate: Fish, chicken, beans, and nuts are all healthy, versatile protein sources. Limit red meat.   The diet, however, does go beyond the plate, offering a few other suggestions.   Use healthy plant oils, such as olive, canola, soy, corn, sunflower and peanut. Check the labels, and avoid partially hydrogenated oil, which have unhealthy trans fats.   If youre thirsty, drink water. Coffee and tea are good in moderation, but skip sugary drinks and limit milk  and dairy products to one or two daily servings.   The type of carbohydrate in the diet is more important than the amount. Some sources of carbohydrates, such as vegetables, fruits, whole grains, and beans-are healthier than others.   Finally, stay active  Signed, Berniece Salines, DO  06/13/2021 3:03 PM    [facility]

## 2021-06-13 NOTE — Patient Instructions (Addendum)
Medication Instructions:  Your physician has recommended you make the following change in your medication:  STOP: Pravastatin  START: Crestor 20 mg once daily START: Imdur 30 mg once daily *If you need a refill on your cardiac medications before your next appointment, please call your pharmacy*   Lab Work: Your physician recommends that you return for lab work in:  TODAY: BMET, Mag, CBC If you have labs (blood work) drawn today and your tests are completely normal, you will receive your results only by: MyChart Message (if you have MyChart) OR A paper copy in the mail If you have any lab test that is abnormal or we need to change your treatment, we will call you to review the results.   Testing/Procedures:  [facility] [address] [place] [address] Dept: [phone] Loc: [phone]  Sean Kirby  06/13/2021  You are scheduled for a Cardiac Catheterization on Tuesday, January 3 with Dr. Daneen Schick.  1. Please arrive at the [facility] (Main Entrance A) at [facility]: [address] [place], [place] [address] at 7:00 AM (This time is two hours before your procedure to ensure your preparation). Free valet parking service is available.   Special note: Every effort is made to have your procedure done on time. Please understand that emergencies sometimes delay scheduled procedures.  2. Diet: Do not eat solid foods after midnight.  The patient may have clear liquids until 5am upon the day of the procedure.  3. Labs: You will need to have blood drawn on TODAY.  4. Medication instructions in preparation for your procedure:   Contrast Allergy: No  Do not take Diabetes Med Glucophage (Metformin) on the day of the procedure and HOLD 48 HOURS AFTER THE PROCEDURE.  On the morning of your procedure, take your Aspirin and any morning medicines NOT listed above.  You may use sips  of water.  5. Plan for one night stay--bring personal belongings. 6. Bring a current list of your medications and current insurance cards. 7. You MUST have a responsible person to drive you home. 8. Someone MUST be with you the first 24 hours after you arrive home or your discharge will be delayed. 9. Please wear clothes that are easy to get on and off and wear slip-on shoes.  Thank you for allowing [place] to care for you!   -- [place] [place] [facility]    Follow-Up: At [facility], you and your health needs are our priority.  As part of our continuing mission to provide you with exceptional heart care, we have created designated Provider Care Teams.  These Care Teams include your primary Cardiologist (physician) and Advanced Practice Providers (APPs -  Physician Assistants and Nurse Practitioners) who all work together to provide you with the care you need, when you need it.  We recommend signing up for the patient portal called "MyChart".  Sign up information is provided on this After Visit Summary.  MyChart is used to connect with patients for Virtual Visits (Telemedicine).  Patients are able to view lab/test results, encounter notes, upcoming appointments, etc.  Non-urgent messages can be sent to your provider as well.   To learn more about what you can do with MyChart, go to [email or web address].    Your next appointment:   2-4 week(s) after cath  The format for your next appointment:   In Person  Provider:   Berniece Salines, DO  Other Instructions

## 2021-06-13 NOTE — Progress Notes (Signed)
Pt has an appt 1/4 

## 2021-06-14 ENCOUNTER — Ambulatory Visit (HOSPITAL_COMMUNITY)
Admission: RE | Admit: 2021-06-14 | Discharge: 2021-06-14 | Disposition: A | Discharge status: Home / Self Care | Payer: Medicare Other | Source: Ambulatory Visit | Attending: Cardiology | Admitting: Cardiology

## 2021-06-14 ENCOUNTER — Telehealth: Payer: Self-pay | Admitting: Cardiology

## 2021-06-14 ENCOUNTER — Other Ambulatory Visit (HOSPITAL_COMMUNITY): Payer: Self-pay | Admitting: Emergency Medicine

## 2021-06-14 ENCOUNTER — Telehealth: Payer: Self-pay

## 2021-06-14 DIAGNOSIS — R931 Abnormal findings on diagnostic imaging of heart and coronary circulation: Secondary | ICD-10-CM | POA: Diagnosis present

## 2021-06-14 DIAGNOSIS — R079 Chest pain, unspecified: Secondary | ICD-10-CM

## 2021-06-14 DIAGNOSIS — I2510 Atherosclerotic heart disease of native coronary artery without angina pectoris: Secondary | ICD-10-CM | POA: Diagnosis not present

## 2021-06-14 LAB — CBC WITH DIFFERENTIAL/PLATELET
Basophils Absolute: 0 10*3/uL (ref 0.0–0.2)
Basos: 1 %
EOS (ABSOLUTE): 0.6 10*3/uL — ABNORMAL HIGH (ref 0.0–0.4)
Eos: 8 %
Hematocrit: 42.6 % (ref 37.5–51.0)
Hemoglobin: 15.4 g/dL (ref 13.0–17.7)
Immature Grans (Abs): 0 10*3/uL (ref 0.0–0.1)
Immature Granulocytes: 0 %
Lymphocytes Absolute: 1.7 10*3/uL (ref 0.7–3.1)
Lymphs: 22 %
MCH: 32.4 pg (ref 26.6–33.0)
MCHC: 36.2 g/dL — ABNORMAL HIGH (ref 31.5–35.7)
MCV: 90 fL (ref 79–97)
Monocytes Absolute: 0.8 10*3/uL (ref 0.1–0.9)
Monocytes: 11 %
Neutrophils Absolute: 4.3 10*3/uL (ref 1.4–7.0)
Neutrophils: 58 %
Platelets: 234 10*3/uL (ref 150–450)
RBC: 4.75 x10E6/uL (ref 4.14–5.80)
RDW: 11.6 % (ref 11.6–15.4)
WBC: 7.4 10*3/uL (ref 3.4–10.8)

## 2021-06-14 NOTE — Telephone Encounter (Signed)
Follow Up:     Patient is returning a call from this morning, he said he did not know who called him.

## 2021-06-14 NOTE — Telephone Encounter (Signed)
Spoke with patient, see chart.    

## 2021-06-14 NOTE — Telephone Encounter (Signed)
Called patient and reviewed the following pre-cath instructions with him. Patient has no questions at this time.  Cardiac catheterization scheduled at [facility] for: [facility] Main Entrance A [facility]) at: 0700  Diet-no solid food after midnight prior to cath, clear liquids until 5 AM day of procedure.  Medication instructions for procedure:  -Usual morning medications can be taken pre-cath with sips of water including aspirin 81 mg.  -Patient is instructed to hold Accuretic and Metformin the morning of the procedure.    Confirmed patient has responsible adult to drive home post procedure and be with patient first 24 hours after arriving home.  [facility] does allow one visitor to accompany you and wait in the hospital waiting room while you are there for your procedure.  You and your visitor will be asked to wear a mask once you enter the hospital.  Patient reports does not currently have any new symptoms concerning for COVID-19 and no household members with COVID-19 like illness.

## 2021-06-17 NOTE — H&P (Signed)
CTA models ostial LAD/Ramus disease with occlusion of Cfx. DM, hyperlipidemia, hypertension etc.

## 2021-06-18 ENCOUNTER — Ambulatory Visit (HOSPITAL_COMMUNITY)
Admission: RE | Admit: 2021-06-18 | Discharge: 2021-06-18 | Disposition: A | Discharge status: Home / Self Care | Payer: Medicare Other | Attending: Interventional Cardiology | Admitting: Interventional Cardiology

## 2021-06-18 ENCOUNTER — Encounter (HOSPITAL_COMMUNITY)
Admission: RE | Disposition: A | Discharge status: Home / Self Care | Payer: Self-pay | Source: Home / Self Care | Attending: Interventional Cardiology

## 2021-06-18 ENCOUNTER — Encounter (HOSPITAL_COMMUNITY): Payer: Self-pay | Admitting: Interventional Cardiology

## 2021-06-18 ENCOUNTER — Ambulatory Visit (HOSPITAL_BASED_OUTPATIENT_CLINIC_OR_DEPARTMENT_OTHER): Payer: Medicare Other

## 2021-06-18 DIAGNOSIS — I2582 Chronic total occlusion of coronary artery: Secondary | ICD-10-CM | POA: Insufficient documentation

## 2021-06-18 DIAGNOSIS — I10 Essential (primary) hypertension: Secondary | ICD-10-CM | POA: Diagnosis not present

## 2021-06-18 DIAGNOSIS — I2510 Atherosclerotic heart disease of native coronary artery without angina pectoris: Secondary | ICD-10-CM

## 2021-06-18 DIAGNOSIS — E1100 Type 2 diabetes mellitus with hyperosmolarity without nonketotic hyperglycemic-hyperosmolar coma (NKHHC): Secondary | ICD-10-CM | POA: Insufficient documentation

## 2021-06-18 DIAGNOSIS — E119 Type 2 diabetes mellitus without complications: Secondary | ICD-10-CM

## 2021-06-18 DIAGNOSIS — E785 Hyperlipidemia, unspecified: Secondary | ICD-10-CM

## 2021-06-18 DIAGNOSIS — I253 Aneurysm of heart: Secondary | ICD-10-CM | POA: Insufficient documentation

## 2021-06-18 HISTORY — PX: LEFT HEART CATH AND CORONARY ANGIOGRAPHY: CATH118249

## 2021-06-18 LAB — GLUCOSE, CAPILLARY: Glucose-Capillary: 120 mg/dL — ABNORMAL HIGH (ref 70–99)

## 2021-06-18 LAB — ECHOCARDIOGRAM COMPLETE
AR max vel: 2.48 cm2
AV Area VTI: 2.79 cm2
AV Area mean vel: 2.65 cm2
AV Mean grad: 3 mmHg
AV Peak grad: 5.9 mmHg
Ao pk vel: 1.21 m/s
Area-P 1/2: 3.42 cm2
Height: 66 in
S' Lateral: 2.9 cm
Weight: 2528 oz

## 2021-06-18 SURGERY — LEFT HEART CATH AND CORONARY ANGIOGRAPHY
Anesthesia: LOCAL

## 2021-06-18 MED ORDER — HEPARIN (PORCINE) IN NACL 1000-0.9 UT/500ML-% IV SOLN
INTRAVENOUS | Status: DC | PRN
  Administered 2021-06-18 (×2): 500 mL

## 2021-06-18 MED ORDER — SODIUM CHLORIDE 0.9 % IV SOLN: INTRAVENOUS | Status: DC

## 2021-06-18 MED ORDER — SODIUM CHLORIDE 0.9 % IV SOLN: 250.0000 mL | INTRAVENOUS | Status: DC | PRN

## 2021-06-18 MED ORDER — MIDAZOLAM HCL 2 MG/2ML IJ SOLN
INTRAMUSCULAR | Status: DC | PRN
  Administered 2021-06-18: 1 mg via INTRAVENOUS
  Administered 2021-06-18: .5 mg via INTRAVENOUS

## 2021-06-18 MED ORDER — HEPARIN SODIUM (PORCINE) 1000 UNIT/ML IJ SOLN
INTRAMUSCULAR | Status: DC | PRN
  Administered 2021-06-18: 3500 [IU] via INTRAVENOUS

## 2021-06-18 MED ORDER — FENTANYL CITRATE (PF) 100 MCG/2ML IJ SOLN
INTRAMUSCULAR | Status: AC
  Filled 2021-06-18: qty 2

## 2021-06-18 MED ORDER — SODIUM CHLORIDE 0.9% FLUSH: 3.0000 mL | Freq: Two times a day (BID) | INTRAVENOUS | Status: DC

## 2021-06-18 MED ORDER — SODIUM CHLORIDE 0.9 % WEIGHT BASED INFUSION
3.0000 mL/kg/h | INTRAVENOUS | Status: AC
  Administered 2021-06-18: 3 mL/kg/h via INTRAVENOUS

## 2021-06-18 MED ORDER — METOPROLOL SUCCINATE ER 25 MG PO TB24: 25.0000 mg | ORAL_TABLET | Freq: Every day | ORAL | 11 refills | Status: DC

## 2021-06-18 MED ORDER — ASPIRIN 81 MG PO CHEW: 81.0000 mg | CHEWABLE_TABLET | ORAL | Status: DC

## 2021-06-18 MED ORDER — SODIUM CHLORIDE 0.9% FLUSH: 3.0000 mL | INTRAVENOUS | Status: DC | PRN

## 2021-06-18 MED ORDER — MIDAZOLAM HCL 2 MG/2ML IJ SOLN
INTRAMUSCULAR | Status: AC
  Filled 2021-06-18: qty 2

## 2021-06-18 MED ORDER — SODIUM CHLORIDE 0.9 % WEIGHT BASED INFUSION: 1.0000 mL/kg/h | INTRAVENOUS | Status: DC

## 2021-06-18 MED ORDER — HEPARIN (PORCINE) IN NACL 1000-0.9 UT/500ML-% IV SOLN
INTRAVENOUS | Status: AC
  Filled 2021-06-18: qty 1000

## 2021-06-18 MED ORDER — LABETALOL HCL 5 MG/ML IV SOLN: 10.0000 mg | INTRAVENOUS | Status: DC | PRN

## 2021-06-18 MED ORDER — HYDRALAZINE HCL 20 MG/ML IJ SOLN: 10.0000 mg | INTRAMUSCULAR | Status: DC | PRN

## 2021-06-18 MED ORDER — ONDANSETRON HCL 4 MG/2ML IJ SOLN: 4.0000 mg | Freq: Four times a day (QID) | INTRAMUSCULAR | Status: DC | PRN

## 2021-06-18 MED ORDER — IOHEXOL 350 MG/ML SOLN
INTRAVENOUS | Status: DC | PRN
  Administered 2021-06-18: 90 mL

## 2021-06-18 MED ORDER — ACETAMINOPHEN 325 MG PO TABS: 650.0000 mg | ORAL_TABLET | ORAL | Status: DC | PRN

## 2021-06-18 MED ORDER — HEPARIN SODIUM (PORCINE) 1000 UNIT/ML IJ SOLN
INTRAMUSCULAR | Status: AC
  Filled 2021-06-18: qty 10

## 2021-06-18 MED ORDER — FENTANYL CITRATE (PF) 100 MCG/2ML IJ SOLN
INTRAMUSCULAR | Status: DC | PRN
  Administered 2021-06-18 (×2): 25 ug via INTRAVENOUS

## 2021-06-18 MED ORDER — VERAPAMIL HCL 2.5 MG/ML IV SOLN
INTRAVENOUS | Status: DC | PRN
  Administered 2021-06-18: 10 mL via INTRA_ARTERIAL

## 2021-06-18 MED ORDER — LIDOCAINE HCL (PF) 1 % IJ SOLN
INTRAMUSCULAR | Status: AC
  Filled 2021-06-18: qty 30

## 2021-06-18 MED ORDER — LIDOCAINE HCL (PF) 1 % IJ SOLN
INTRAMUSCULAR | Status: DC | PRN
  Administered 2021-06-18: 2 mL

## 2021-06-18 MED ORDER — VERAPAMIL HCL 2.5 MG/ML IV SOLN
INTRAVENOUS | Status: AC
  Filled 2021-06-18: qty 2

## 2021-06-18 MED ORDER — NITROGLYCERIN 0.4 MG SL SUBL: 0.4000 mg | SUBLINGUAL_TABLET | SUBLINGUAL | Status: DC | PRN

## 2021-06-18 SURGICAL SUPPLY — 11 items
CATH 5FR JL3.5 JR4 ANG PIG MP (CATHETERS) ×1 IMPLANT
CATH LAUNCHER 6FR EBU 3.5 SH (CATHETERS) ×1 IMPLANT
DEVICE RAD COMP TR BAND LRG (VASCULAR PRODUCTS) ×1 IMPLANT
GLIDESHEATH SLEND A-KIT 6F 22G (SHEATH) ×1 IMPLANT
GUIDEWIRE INQWIRE 1.5J.035X260 (WIRE) IMPLANT
INQWIRE 1.5J .035X260CM (WIRE) ×2
KIT HEART LEFT (KITS) ×2 IMPLANT
PACK CARDIAC CATHETERIZATION (CUSTOM PROCEDURE TRAY) ×2 IMPLANT
SHEATH PROBE COVER 6X72 (BAG) ×1 IMPLANT
TRANSDUCER W/STOPCOCK (MISCELLANEOUS) ×2 IMPLANT
TUBING CIL FLEX 10 FLL-RA (TUBING) ×2 IMPLANT

## 2021-06-18 NOTE — Interval H&P Note (Signed)
Cath Lab Visit (complete for each Cath Lab visit)  Clinical Evaluation Leading to the Procedure:   ACS: No.  Non-ACS:    Anginal Classification: CCS Kirby  Anti-ischemic medical therapy: Minimal Therapy (1 class of medications)  Non-Invasive Test Results: High-risk stress test findings: cardiac mortality >3%/year  Prior CABG: No previous CABG      History and Physical Interval Note:  06/18/2021 9:21 AM  [name]  has presented today for surgery, with the diagnosis of cad.  The various methods of treatment have been discussed with the patient and family. After consideration of risks, benefits and other options for treatment, the patient has consented to  Procedure(s): LEFT HEART CATH AND CORONARY ANGIOGRAPHY (N/A) as a surgical intervention.  The patient's history has been reviewed, patient examined, no change in status, stable for surgery.  I have reviewed the patient's chart and labs.  Questions were answered to the patient's satisfaction.     Sean Kirby

## 2021-06-18 NOTE — Discharge Instructions (Signed)
Please set the time of cardiac surgery consultation before discharge from short stay.  Please speak with Birdie Sons.  Needs to be seen within 3 to 7 days.

## 2021-06-18 NOTE — Progress Notes (Signed)
TCTS consulted for outpatient CABG evaluation. TCTS office will call the patient with an appointment. 

## 2021-06-18 NOTE — Progress Notes (Signed)
Echocardiogram 2D Echocardiogram has been performed.  Sean Kirby F 06/18/2021, 12:38 PM

## 2021-06-18 NOTE — CV Procedure (Signed)
Severe three-vessel CAD: 95% ostial LAD, 70% proximal LAD, 80% proximal ramus intermedius, 95% proximal circumflex followed by total occlusion after the first obtuse marginal, PDA 95% ostium.  Distal left main with saccular aneurysm. Mid inferior wall mild hypokinesis.  EF 55% with normal LVEDP. Recommend surgical revascularization.  We will get the patient set up for outpatient evaluation hopefully within the next 72 hours.  Patient is asymptomatic relative to angina or ischemic symptoms.

## 2021-06-19 ENCOUNTER — Other Ambulatory Visit: Payer: Self-pay

## 2021-06-19 ENCOUNTER — Other Ambulatory Visit: Payer: Self-pay | Admitting: *Deleted

## 2021-06-19 ENCOUNTER — Encounter: Payer: Self-pay | Admitting: Cardiothoracic Surgery

## 2021-06-19 ENCOUNTER — Institutional Professional Consult (permissible substitution) (INDEPENDENT_AMBULATORY_CARE_PROVIDER_SITE_OTHER): Payer: Medicare Other | Admitting: Cardiothoracic Surgery

## 2021-06-19 VITALS — BP 120/69 | HR 73 | Resp 20 | Ht 66.0 in | Wt 158.0 lb

## 2021-06-19 DIAGNOSIS — I2510 Atherosclerotic heart disease of native coronary artery without angina pectoris: Secondary | ICD-10-CM | POA: Diagnosis not present

## 2021-06-19 DIAGNOSIS — I200 Unstable angina: Secondary | ICD-10-CM

## 2021-06-19 DIAGNOSIS — E089 Diabetes mellitus due to underlying condition without complications: Secondary | ICD-10-CM

## 2021-06-19 DIAGNOSIS — Z5181 Encounter for therapeutic drug level monitoring: Secondary | ICD-10-CM

## 2021-06-19 NOTE — Progress Notes (Signed)
PCP is Shirline Frees, MD Referring Provider is [name], MD  Chief Complaint  Patient presents with   Coronary Artery Disease    Surgical consult, Cardiac Cath 06/18/21, ECHO 06/18/21, CT Coronary 06/14/21    HPI: Patient examined, images of coronary arteriogram and echocardiogram and cardiac CT scan personally reviewed and counseled with patient and his friend in attendance, Shanon Brow.  66 year old well-controlled type II diabetic presents for evaluation of recently diagnosed severe multivessel coronary disease with class III symptoms of angina.  He has a history of hypertension and hyperlipidemia.  He is not overweight 80 watches his diet and exercises daily with scheduled walks.  He recently developed substernal discomfort associated with exertion and decreased exercise tolerance.  Cardiac CT scan was performed which showed fairly severe distal circumflex disease and a small PFO.  He underwent left heart cath by Dr. Tamala Julian which showed severe three-vessel coronary disease with a 95% proximal LAD, 90% proximal circumflex stenosis, distal circumflex occlusion beyond the first marginal with atretic distal circumflex vessels.  The right coronary has a 95% stenosis of the proximal posterior descending.  LVEF appears normal and LVEDP was less than 10 mmHg.Marland Kitchen  Patient has been recommended for multivessel coronary bypass grafting.  There is no history of venous disease phlebitis or varicose veins.  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  He also had a negative acetylcholine receptor antibody to rule out myasthenia.  Patient has not had any major previous surgery.  He denies smoking or pulmonary problems.  He is right-hand dominant and is alone and is retired. He has never had COVID and has received 4 vaccines   Past Medical History:  Diagnosis Date   Anxiety    Arthritis     Diabetes mellitus    GERD (gastroesophageal reflux disease)    Glaucoma    Hyperlipidemia    Hypertension    Hypoglycemia    Neuromuscular disorder ([place])    neuropathy feet    Past Surgical History:  Procedure Laterality Date   GLAUCOMA REPAIR  2006   lazer-low angle -both   HERNIA REPAIR     INGUINAL HERNIA REPAIR  12/02/2011   Procedure: HERNIA REPAIR INGUINAL ADULT;  Surgeon: [name], MD;  Location: [facility];  Service: General;  Laterality: Left;  left inguinal   LEFT HEART CATH AND CORONARY ANGIOGRAPHY N/A 06/18/2021   Procedure: LEFT HEART CATH AND CORONARY ANGIOGRAPHY;  Surgeon: [name], MD;  Location: [facility];  Service: Cardiovascular;  Laterality: N/A;   SIGMOIDOSCOPY     TONSILLECTOMY AND ADENOIDECTOMY      Family History  Problem Relation Age of Onset   Heart disease Mother    Heart disease Maternal Aunt    Heart disease Maternal Grandmother    Colon cancer Neg Hx     Social History Social History   Tobacco Use   Smoking status: Never   Smokeless tobacco: Never  Vaping Use   Vaping Use: Never used  Substance Use Topics   Alcohol use: No   Drug use: No    Current Outpatient Medications  Medication Sig Dispense Refill   alclomethasone (ACLOVATE) 0.05 % cream Apply 1 application topically 2 (two) times daily as needed (skin irritation.).     amLODipine (NORVASC) 5 MG tablet Take 5 mg by mouth at bedtime.     fexofenadine (ALLEGRA) 180  MG tablet Take 180 mg by mouth daily as needed for allergies.     glucose blood test strip 1 each by Other route as needed. Use as instructed     isosorbide mononitrate (IMDUR) 30 MG 24 hr tablet Take 1 tablet (30 mg total) by mouth daily. 90 tablet 3   latanoprost (XALATAN) 0.005 % ophthalmic solution Place 1 drop into both eyes at bedtime.     LORazepam (ATIVAN) 0.5 MG tablet Take 0.5 mg by mouth daily as needed for anxiety.     meclizine (ANTIVERT) 25 MG tablet Take 1 tablet (25  mg total) by mouth 3 (three) times daily as needed for dizziness. 30 tablet 0   metFORMIN (GLUCOPHAGE) 850 MG tablet Take 1 tablet by mouth 2 (two) times daily with a meal.     metoprolol succinate (TOPROL XL) 25 MG 24 hr tablet Take 1 tablet (25 mg total) by mouth daily. 30 tablet 11   Multiple Vitamin (MULTIVITAMIN WITH MINERALS) TABS Take 1 tablet by mouth in the morning.     quinapril-hydrochlorothiazide (ACCURETIC) 20-12.5 MG per tablet Take 1 tablet by mouth in the morning.     rosuvastatin (CRESTOR) 20 MG tablet Take 1 tablet (20 mg total) by mouth daily. 90 tablet 3   timolol (TIMOPTIC) 0.5 % ophthalmic solution Place 1 drop into both eyes in the morning.     Current Facility-Administered Medications  Medication Dose Route Frequency Provider Last Rate Last Admin   nitroGLYCERIN (NITROSTAT) SL tablet 0.4 mg  0.4 mg Sublingual Q5 min PRN [name], MD        Allergies  Allergen Reactions   Codeine     Patient unsure of reaction.   Septra [Sulfamethoxazole-Trimethoprim]     Patient unsure of reaction.   Dapagliflozin Other (See Comments)    (Farxiga) Dizziness & low BP   Sertraline Hcl Other (See Comments)    diarrhea   Glimepiride Other (See Comments)    Sugar bottoms out   Simvastatin Diarrhea    Review of Systems: No visual symptoms after resolution of left trochlear nerve dysfunction last year no difficulty swallowing or dental complaints No history of thoracic trauma or pneumothorax No history of atrial fibrillation No history of jaundice hepatitis blood per rectum no history of kidney stones hematuria or prostate tumor No complaints of polyuria or dysphagia with well-controlled diabetes hemoglobin A1c 7.1  BP 120/69    Pulse 73    Resp 20    Ht 5\' 6"  (1.676 m)    Wt 158 lb (71.7 kg)    SpO2 97% Comment: RA   BMI 25.50 kg/m  Physical Exam:      Exam    General- alert and comfortable    Neck- no JVD, no cervical adenopathy palpable, no carotid bruit   Lungs-  clear without rales, wheezes.  Slight pectus deformity.   Cor- regular rate and rhythm, no murmur , gallop   Abdomen- soft, non-tender   Extremities - warm, non-tender, minimal edema   Neuro- oriented, appropriate, no focal weakness    Diagnostic Tests: Cardiac catheterization so severe three-vessel coronary disease with graftable vessels to the LAD, ramus intermediate, OM1, and posterior descending.  Echocardiogram shows mild LVH with normal systolic function without valvular disease.  Impression: Severe multivessel coronary disease with symptoms of class III angina Fairly well-controlled type 2 diabetes mellitus Hypertension   Plan: I agree that multivessel coronary bypass grafting is the best treatment for this patient's severe three-vessel disease preserved  LV function and diabetes.  I have discussed the expected benefits of the procedure with the patient including preservation of LV function, improve survival, and improvement in his symptoms.  He understands the risks of cardiac bypass surgery including risks of infection, stroke, postoperative arrhythmias, renal failure and organ failure, and death.  He demonstrates his understanding and agrees to proceed with surgery which was scheduled at [facility] on January 10.  His preoperative evaluation will be arranged to the office before that time.  He will stop his metformin 48 hours prior to surgery.   [name], MD [facility] [phone]

## 2021-06-20 ENCOUNTER — Encounter: Payer: Medicare Other | Admitting: Cardiothoracic Surgery

## 2021-06-20 NOTE — Pre-Procedure Instructions (Signed)
Surgical Instructions    Your procedure is scheduled on Tuesday, January 10th.  Report to [facility] Main Entrance "A" at 05:30 A.M., then check in with the Admitting office.  Call this number if you have problems the morning of surgery:  [phone]   If you have any questions prior to your surgery date call [phone]: Open Monday-Friday 8am-4pm    Remember:  Do not eat  or drink after midnight the night before your surgery    Take these medicines the morning of surgery with A SIP OF WATER  isosorbide mononitrate (IMDUR) metoprolol succinate (TOPROL XL)  rosuvastatin (CRESTOR)  timolol (TIMOPTIC) eye drops   If needed: fexofenadine (ALLEGRA)  LORazepam (ATIVAN) meclizine (ANTIVERT)   Follow your surgeon's instructions on when to stop Aspirin.  If no instructions were given by your surgeon then you will need to call the office to get those instructions.     As of today, STOP taking any Aspirin (unless otherwise instructed by your surgeon) Aleve, Naproxen, Ibuprofen, Motrin, Advil, Goody's, BC's, all herbal medications, fish oil, and all vitamins.    WHAT DO I DO ABOUT MY DIABETES MEDICATION?   Do not take metFORMIN (GLUCOPHAGE) the morning of surgery.   HOW TO MANAGE YOUR DIABETES BEFORE AND AFTER SURGERY  Why is it important to control my blood sugar before and after surgery? Improving blood sugar levels before and after surgery helps healing and can limit problems. A way of improving blood sugar control is eating a healthy diet by:  Eating less sugar and carbohydrates  Increasing activity/exercise  Talking with your doctor about reaching your blood sugar goals High blood sugars (greater than 180 mg/dL) can raise your risk of infections and slow your recovery, so you will need to focus on controlling your diabetes during the weeks before surgery. Make sure that the doctor who takes care of your diabetes knows about your planned surgery including the date and  location.  How do I manage my blood sugar before surgery? Check your blood sugar at least 4 times a day, starting 2 days before surgery, to make sure that the level is not too high or low.  Check your blood sugar the morning of your surgery when you wake up and every 2 hours until you get to the Short Stay unit.  If your blood sugar is less than 70 mg/dL, you will need to treat for low blood sugar: Do not take insulin. Treat a low blood sugar (less than 70 mg/dL) with  cup of clear juice (cranberry or apple), 4 glucose tablets, OR glucose gel. Recheck blood sugar in 15 minutes after treatment (to make sure it is greater than 70 mg/dL). If your blood sugar is not greater than 70 mg/dL on recheck, call [phone] for further instructions. Report your blood sugar to the short stay nurse when you get to Short Stay.  If you are admitted to the hospital after surgery: Your blood sugar will be checked by the staff and you will probably be given insulin after surgery (instead of oral diabetes medicines) to make sure you have good blood sugar levels. The goal for blood sugar control after surgery is 80-180 mg/dL.                    Do NOT Smoke (Tobacco/Vaping) or drink Alcohol 24 hours prior to your procedure.  If you use a CPAP at night, you may bring all equipment for your overnight stay.   Contacts, glasses, piercing's,  hearing aid's, dentures or partials may not be worn into surgery, please bring cases for these belongings.    For patients admitted to the hospital, discharge time will be determined by your treatment team.   Patients discharged the day of surgery will not be allowed to drive home, and someone needs to stay with them for 24 hours.  NO VISITORS WILL BE ALLOWED IN PRE-OP WHERE PATIENTS GET READY FOR SURGERY.  ONLY 1 SUPPORT PERSON MAY BE PRESENT IN THE WAITING ROOM WHILE YOU ARE IN SURGERY.  IF YOU ARE TO BE ADMITTED, ONCE YOU ARE IN YOUR ROOM YOU WILL BE ALLOWED TWO (2)  VISITORS.  Minor children may have two parents present. Special consideration for safety and communication needs will be reviewed on a case by case basis.   Special instructions:   [facility]- Preparing For Surgery  Before surgery, you can play an important role. Because skin is not sterile, your skin needs to be as free of germs as possible. You can reduce the number of germs on your skin by washing with CHG (chlorahexidine gluconate) Soap before surgery.  CHG is an antiseptic cleaner which kills germs and bonds with the skin to continue killing germs even after washing.    Oral Hygiene is also important to reduce your risk of infection.  Remember - BRUSH YOUR TEETH THE MORNING OF SURGERY WITH YOUR REGULAR TOOTHPASTE  Please do not use if you have an allergy to CHG or antibacterial soaps. If your skin becomes reddened/irritated stop using the CHG.  Do not shave (including legs and underarms) for at least 48 hours prior to first CHG shower. It is OK to shave your face.  Please follow these instructions carefully.   Shower the NIGHT BEFORE SURGERY and the MORNING OF SURGERY  If you chose to wash your hair, wash your hair first as usual with your normal shampoo.  After you shampoo, rinse your hair and body thoroughly to remove the shampoo.  Use CHG Soap as you would any other liquid soap. You can apply CHG directly to the skin and wash gently with a scrungie or a clean washcloth.   Apply the CHG Soap to your body ONLY FROM THE NECK DOWN.  Do not use on open wounds or open sores. Avoid contact with your eyes, ears, mouth and genitals (private parts). Wash Face and genitals (private parts)  with your normal soap.   Wash thoroughly, paying special attention to the area where your surgery will be performed.  Thoroughly rinse your body with warm water from the neck down.  DO NOT shower/wash with your normal soap after using and rinsing off the CHG Soap.  Pat yourself dry with a CLEAN  TOWEL.  Wear CLEAN PAJAMAS to bed the night before surgery  Place CLEAN SHEETS on your bed the night before your surgery  DO NOT SLEEP WITH PETS.   Day of Surgery: Shower with CHG soap. Do not wear jewelry Do not wear lotions, powders, colognes, or deodorant. Men may shave face and neck. Do not bring valuables to the hospital. [facility] is not responsible for any belongings or valuables. Wear Clean/Comfortable clothing the morning of surgery Remember to brush your teeth WITH YOUR REGULAR TOOTHPASTE.   Please read over the following fact sheets that you were given.   3 days prior to your procedure or After your COVID test   You are not required to quarantine however you are required to wear a well-fitting mask when you  are out and around people not in your household. If your mask becomes wet or soiled, replace with a new one.   Wash your hands often with soap and water for 20 seconds or clean your hands with an alcohol-based hand sanitizer that contains at least 60% alcohol.   Do not share personal items.   Notify your provider:  o if you are in close contact with someone who has COVID  o or if you develop a fever of 100.4 or greater, sneezing, cough, sore throat, shortness of breath or body aches.

## 2021-06-21 ENCOUNTER — Other Ambulatory Visit: Payer: Self-pay

## 2021-06-21 ENCOUNTER — Encounter (HOSPITAL_COMMUNITY): Payer: Self-pay

## 2021-06-21 ENCOUNTER — Ambulatory Visit (HOSPITAL_COMMUNITY)
Admission: RE | Admit: 2021-06-21 | Discharge: 2021-06-21 | Disposition: A | Discharge status: Home / Self Care | Payer: Medicare Other | Source: Ambulatory Visit | Attending: Cardiothoracic Surgery | Admitting: Cardiothoracic Surgery

## 2021-06-21 ENCOUNTER — Encounter (HOSPITAL_COMMUNITY)
Admission: RE | Admit: 2021-06-21 | Discharge: 2021-06-21 | Disposition: A | Discharge status: Home / Self Care | Payer: Medicare Other | Source: Ambulatory Visit | Attending: Cardiothoracic Surgery | Admitting: Cardiothoracic Surgery

## 2021-06-21 VITALS — BP 116/63 | HR 76 | Temp 98.0°F | Resp 17 | Ht 66.0 in | Wt 155.0 lb

## 2021-06-21 DIAGNOSIS — Z5181 Encounter for therapeutic drug level monitoring: Secondary | ICD-10-CM | POA: Insufficient documentation

## 2021-06-21 DIAGNOSIS — I10 Essential (primary) hypertension: Secondary | ICD-10-CM | POA: Insufficient documentation

## 2021-06-21 DIAGNOSIS — E785 Hyperlipidemia, unspecified: Secondary | ICD-10-CM | POA: Diagnosis not present

## 2021-06-21 DIAGNOSIS — Q2112 Patent foramen ovale: Secondary | ICD-10-CM | POA: Insufficient documentation

## 2021-06-21 DIAGNOSIS — I6523 Occlusion and stenosis of bilateral carotid arteries: Secondary | ICD-10-CM | POA: Diagnosis not present

## 2021-06-21 DIAGNOSIS — K219 Gastro-esophageal reflux disease without esophagitis: Secondary | ICD-10-CM | POA: Insufficient documentation

## 2021-06-21 DIAGNOSIS — Z20822 Contact with and (suspected) exposure to covid-19: Secondary | ICD-10-CM | POA: Insufficient documentation

## 2021-06-21 DIAGNOSIS — I2510 Atherosclerotic heart disease of native coronary artery without angina pectoris: Secondary | ICD-10-CM | POA: Insufficient documentation

## 2021-06-21 DIAGNOSIS — E089 Diabetes mellitus due to underlying condition without complications: Secondary | ICD-10-CM | POA: Insufficient documentation

## 2021-06-21 DIAGNOSIS — I2582 Chronic total occlusion of coronary artery: Secondary | ICD-10-CM | POA: Diagnosis not present

## 2021-06-21 DIAGNOSIS — Z85828 Personal history of other malignant neoplasm of skin: Secondary | ICD-10-CM | POA: Diagnosis not present

## 2021-06-21 DIAGNOSIS — Z01818 Encounter for other preprocedural examination: Secondary | ICD-10-CM | POA: Diagnosis present

## 2021-06-21 HISTORY — DX: Depression, unspecified: F32.A

## 2021-06-21 LAB — CBC
HCT: 41 % (ref 39.0–52.0)
Hemoglobin: 14.4 g/dL (ref 13.0–17.0)
MCH: 32.1 pg (ref 26.0–34.0)
MCHC: 35.1 g/dL (ref 30.0–36.0)
MCV: 91.3 fL (ref 80.0–100.0)
Platelets: 232 10*3/uL (ref 150–400)
RBC: 4.49 MIL/uL (ref 4.22–5.81)
RDW: 11.7 % (ref 11.5–15.5)
WBC: 8 10*3/uL (ref 4.0–10.5)
nRBC: 0 % (ref 0.0–0.2)

## 2021-06-21 LAB — COMPREHENSIVE METABOLIC PANEL
ALT: 21 U/L (ref 0–44)
AST: 24 U/L (ref 15–41)
Albumin: 4.1 g/dL (ref 3.5–5.0)
Alkaline Phosphatase: 90 U/L (ref 38–126)
Anion gap: 8 (ref 5–15)
BUN: 14 mg/dL (ref 8–23)
CO2: 23 mmol/L (ref 22–32)
Calcium: 9.6 mg/dL (ref 8.9–10.3)
Chloride: 99 mmol/L (ref 98–111)
Creatinine, Ser: 0.83 mg/dL (ref 0.61–1.24)
GFR, Estimated: >60 mL/min (ref >60)
Glucose, Bld: 175 mg/dL — ABNORMAL HIGH (ref 70–99)
Potassium: 4.1 mmol/L (ref 3.5–5.1)
Sodium: 130 mmol/L — ABNORMAL LOW (ref 135–145)
Total Bilirubin: <0.1 mg/dL — ABNORMAL LOW (ref 0.3–1.2)
Total Protein: 7 g/dL (ref 6.5–8.1)

## 2021-06-21 LAB — BLOOD GAS, ARTERIAL
Acid-Base Excess: 1.9 mmol/L (ref 0.0–2.0)
Bicarbonate: 25.7 mmol/L (ref 20.0–28.0)
Drawn by: 58193
FIO2: 21
O2 Saturation: 98 %
Patient temperature: 37
pCO2 arterial: 39.1 mmHg (ref 32.0–48.0)
pH, Arterial: 7.434 (ref 7.350–7.450)
pO2, Arterial: 103 mmHg (ref 83.0–108.0)

## 2021-06-21 LAB — SURGICAL PCR SCREEN
MRSA, PCR: NEGATIVE
Staphylococcus aureus: NEGATIVE

## 2021-06-21 LAB — PROTIME-INR
INR: 1 (ref 0.8–1.2)
Prothrombin Time: 13.1 seconds (ref 11.4–15.2)

## 2021-06-21 LAB — HEMOGLOBIN A1C
Hgb A1c MFr Bld: 6.9 % — ABNORMAL HIGH (ref 4.8–5.6)
Mean Plasma Glucose: 151.33 mg/dL

## 2021-06-21 LAB — APTT: aPTT: 28 seconds (ref 24–36)

## 2021-06-21 LAB — URINALYSIS, ROUTINE W REFLEX MICROSCOPIC
Bilirubin Urine: NEGATIVE
Glucose, UA: NEGATIVE mg/dL
Hgb urine dipstick: NEGATIVE
Ketones, ur: NEGATIVE mg/dL
Leukocytes,Ua: NEGATIVE
Nitrite: NEGATIVE
Protein, ur: NEGATIVE mg/dL
Specific Gravity, Urine: 1.015 (ref 1.005–1.030)
pH: 7 (ref 5.0–8.0)

## 2021-06-21 LAB — GLUCOSE, CAPILLARY: Glucose-Capillary: 146 mg/dL — ABNORMAL HIGH (ref 70–99)

## 2021-06-21 NOTE — Progress Notes (Signed)
PCP - Dr. Shirline Frees Cardiologist - Dr. Berniece Salines  PPM/ICD - denies   Chest x-ray - 06/21/21 at PAT EKG - 06/21/21 at PAT Stress Test - denies ECHO - 06/18/21 Cardiac Cath - 06/18/21  Sleep Study - denies  DM- Type 2 Fasting Blood Sugar - 120-130 Checks Blood Sugar twice a day  Blood Thinner Instructions: n/a Aspirin Instructions: pt instructed to call surgeon's office for instructions on ASA  ERAS Protcol - no, NPO   COVID TEST- 06/21/21 at PAT   Anesthesia review: yes, cardiac hx  Patient denies shortness of breath, fever, cough and chest pain at PAT appointment   All instructions explained to the patient, with a verbal understanding of the material. Patient agrees to go over the instructions while at home for a better understanding. Patient also instructed to wear a mask in public after being tested for COVID-19. The opportunity to ask questions was provided.

## 2021-06-21 NOTE — Progress Notes (Signed)
Pre-CABG ultrasound study completed.   Please see CV Proc for preliminary results.   Darlin Coco, RDMS, RVT

## 2021-06-22 LAB — SARS CORONAVIRUS 2 (TAT 6-24 HRS): SARS Coronavirus 2: NEGATIVE

## 2021-06-24 MED ORDER — CEFAZOLIN SODIUM-DEXTROSE 2-4 GM/100ML-% IV SOLN
2.0000 g | INTRAVENOUS | Status: AC
  Administered 2021-06-25: 2 g via INTRAVENOUS
  Filled 2021-06-24: qty 100

## 2021-06-24 MED ORDER — TRANEXAMIC ACID (OHS) BOLUS VIA INFUSION
15.0000 mg/kg | INTRAVENOUS | Status: AC
  Administered 2021-06-25: 1054.5 mg via INTRAVENOUS
  Filled 2021-06-24: qty 1055

## 2021-06-24 MED ORDER — HEPARIN 30,000 UNITS/1000 ML (OHS) CELLSAVER SOLUTION
Status: DC
  Filled 2021-06-24: qty 1000

## 2021-06-24 MED ORDER — TRANEXAMIC ACID 1000 MG/10ML IV SOLN
1.5000 mg/kg/h | INTRAVENOUS | Status: AC
  Administered 2021-06-25: 1.5 mg/kg/h via INTRAVENOUS
  Filled 2021-06-24: qty 25

## 2021-06-24 MED ORDER — MILRINONE LACTATE IN DEXTROSE 20-5 MG/100ML-% IV SOLN
0.3000 ug/kg/min | INTRAVENOUS | Status: DC
  Filled 2021-06-24: qty 100

## 2021-06-24 MED ORDER — PHENYLEPHRINE HCL-NACL 20-0.9 MG/250ML-% IV SOLN
30.0000 ug/min | INTRAVENOUS | Status: AC
  Administered 2021-06-25: 20 ug/min via INTRAVENOUS
  Filled 2021-06-24: qty 250

## 2021-06-24 MED ORDER — TRANEXAMIC ACID (OHS) PUMP PRIME SOLUTION
2.0000 mg/kg | INTRAVENOUS | Status: DC
  Filled 2021-06-24: qty 1.41

## 2021-06-24 MED ORDER — DEXMEDETOMIDINE HCL IN NACL 400 MCG/100ML IV SOLN
0.1000 ug/kg/h | INTRAVENOUS | Status: AC
  Administered 2021-06-25: .2 ug/kg/h via INTRAVENOUS
  Filled 2021-06-24: qty 100

## 2021-06-24 MED ORDER — VANCOMYCIN HCL 1250 MG/250ML IV SOLN
1250.0000 mg | INTRAVENOUS | Status: AC
  Administered 2021-06-25: 1250 mg via INTRAVENOUS
  Filled 2021-06-24: qty 250

## 2021-06-24 MED ORDER — INSULIN REGULAR(HUMAN) IN NACL 100-0.9 UT/100ML-% IV SOLN
INTRAVENOUS | Status: AC
  Administered 2021-06-25: 1 [IU]/h via INTRAVENOUS
  Filled 2021-06-24: qty 100

## 2021-06-24 MED ORDER — PLASMA-LYTE A IV SOLN
INTRAVENOUS | Status: DC
  Filled 2021-06-24: qty 5

## 2021-06-24 MED ORDER — NOREPINEPHRINE 4 MG/250ML-% IV SOLN
0.0000 ug/min | INTRAVENOUS | Status: DC
  Filled 2021-06-24: qty 250

## 2021-06-24 MED ORDER — MAGNESIUM SULFATE 50 % IJ SOLN
40.0000 meq | INTRAMUSCULAR | Status: DC
  Filled 2021-06-24: qty 9.85

## 2021-06-24 MED ORDER — NITROGLYCERIN IN D5W 200-5 MCG/ML-% IV SOLN
2.0000 ug/min | INTRAVENOUS | Status: DC
  Filled 2021-06-24: qty 250

## 2021-06-24 MED ORDER — EPINEPHRINE HCL 5 MG/250ML IV SOLN IN NS
0.0000 ug/min | INTRAVENOUS | Status: DC
  Filled 2021-06-24: qty 250

## 2021-06-24 MED ORDER — POTASSIUM CHLORIDE 2 MEQ/ML IV SOLN
80.0000 meq | INTRAVENOUS | Status: DC
  Filled 2021-06-24: qty 40

## 2021-06-24 NOTE — Anesthesia Preprocedure Evaluation (Addendum)
Anesthesia Evaluation  Patient identified by MRN, date of birth, ID band Patient awake    Reviewed: Allergy & Precautions, NPO status , Patient's Chart, lab work & pertinent test results  Airway Mallampati: II  TM Distance: >3 FB Neck ROM: Full    Dental no notable dental hx.    Pulmonary neg pulmonary ROS,    Pulmonary exam normal breath sounds clear to auscultation       Cardiovascular hypertension, Pt. on medications + CAD  Normal cardiovascular exam Rhythm:Regular Rate:Normal  Left Ventricle: Left ventricular ejection fraction, by estimation, is 55  to 60%. The left ventricle has normal function. The left ventricle has no  regional wall motion abnormalities. The left ventricular internal cavity  size was normal in size. There is  no left ventricular hypertrophy. Left ventricular diastolic parameters  are consistent with Grade I diastolic dysfunction (impaired relaxation).   Right Ventricle: The right ventricular size is normal. No increase in  right ventricular wall thickness. Right ventricular systolic function is  normal.   Left Atrium: Left atrial size was normal in size.   Right Atrium: Right atrial size was normal in size.   Pericardium: There is no evidence of pericardial effusion.   Mitral Valve: The mitral valve is normal in structure. No evidence of  mitral valve regurgitation. No evidence of mitral valve stenosis.   Tricuspid Valve: The tricuspid valve is normal in structure. Tricuspid  valve regurgitation is not demonstrated. No evidence of tricuspid  stenosis.   Aortic Valve: The aortic valve is normal in structure. Aortic valve  regurgitation is not visualized. No aortic stenosis is present. Aortic  valve mean gradient measures 3.0 mmHg. Aortic valve peak gradient measures  5.9 mmHg. Aortic valve area, by VTI  measures 2.79 cm.   Pulmonic Valve: The pulmonic valve was normal in structure. Pulmonic  valve  regurgitation is not visualized. No evidence of pulmonic stenosis.   Aorta: There is borderline dilatation of the aortic root, measuring 37 mm.   Venous: The inferior vena cava is normal in size with greater than 50%  respiratory variability, suggesting right atrial pressure of 3 mmHg   Neuro/Psych negative neurological ROS  negative psych ROS   GI/Hepatic negative GI ROS, Neg liver ROS,   Endo/Other  diabetes, Type 2  Renal/GU negative Renal ROS  negative genitourinary   Musculoskeletal negative musculoskeletal ROS (+)   Abdominal   Peds negative pediatric ROS (+)  Hematology negative hematology ROS (+)   Anesthesia Other Findings   Reproductive/Obstetrics negative OB ROS                            Anesthesia Physical Anesthesia Plan  ASA: 3  Anesthesia Plan: General   Post-op Pain Management:    Induction: Intravenous  PONV Risk Score and Plan: 2 and Ondansetron, Dexamethasone and Treatment may vary due to age or medical condition  Airway Management Planned: Oral ETT  Additional Equipment: Arterial line, CVP, PA Cath, TEE and Ultrasound Guidance Line Placement  Intra-op Plan:   Post-operative Plan: Post-operative intubation/ventilation  Informed Consent: I have reviewed the patients History and Physical, chart, labs and discussed the procedure including the risks, benefits and alternatives for the proposed anesthesia with the patient or authorized representative who has indicated his/her understanding and acceptance.     Dental advisory given  Plan Discussed with: CRNA and Surgeon  Anesthesia Plan Comments: (PAT note written 06/24/2021 by Myra Gianotti, PA-C. Small PFO  by 05/2021 CCTA. )       Anesthesia Quick Evaluation

## 2021-06-24 NOTE — Progress Notes (Signed)
Anesthesia Chart Review:  Case: [id number] Date/Time: 06/25/21 0715   Procedures:      CORONARY ARTERY BYPASS GRAFTING (CABG) (Chest)     TRANSESOPHAGEAL ECHOCARDIOGRAM (TEE)   Anesthesia type: General   Pre-op diagnosis: CAD   Location: [facility] OR ROOM 67 / [facility]   Surgeons: [name], MD       DISCUSSION: Patient is a 66 year old male scheduled for the above procedure.  History includes never smoker, CAD, HTN, HLD, DM2, GERD, glaucoma (s/p surgery 2006), skin cancer ([place] 2021). Small PFO by 06/12/21 CCTA.  06/21/2021 presurgical COVID-19 test negative.  Anesthesia team to evaluate on the day of surgery.   VS: BP 116/63    Pulse 76    Temp 36.7 C (Oral)    Resp 17    Ht 5\' 6"  (1.676 m)    Wt 70.3 kg    SpO2 97%    BMI 25.02 kg/m   PROVIDERS: Shirline Frees, MD is PCP  Domingo Pulse, DO is cardiologist   LABS: Labs reviewed: Acceptable for surgery. (all labs ordered are listed, but only abnormal results are displayed)  Labs Reviewed  GLUCOSE, CAPILLARY - Abnormal; Notable for the following components:      Result Value   Glucose-Capillary 146 (*)    All other components within normal limits  COMPREHENSIVE METABOLIC PANEL - Abnormal; Notable for the following components:   Sodium 130 (*)    Glucose, Bld 175 (*)    Total Bilirubin <0.1 (*)    All other components within normal limits  HEMOGLOBIN A1C - Abnormal; Notable for the following components:   Hgb A1c MFr Bld 6.9 (*)    All other components within normal limits  SARS CORONAVIRUS 2 (TAT 6-24 HRS)  SURGICAL PCR SCREEN  CBC  PROTIME-INR  APTT  URINALYSIS, ROUTINE W REFLEX MICROSCOPIC  BLOOD GAS, ARTERIAL  TYPE AND SCREEN    IMAGES: CXR 06/21/21: FINDINGS: The heart size and mediastinal contours are within normal limits. Mild atherosclerotic calcification of the aorta is noted. No consolidation, effusion, or pneumothorax. No acute osseous abnormality. IMPRESSION: No active cardiopulmonary disease.   EKG:  06/21/21: NSR   CV: US Carotid 06/21/21: Summary:  Right Carotid: Velocities in the right ICA are consistent with a 1-39%  stenosis.                 The ECA appears >50% stenosed.  Left Carotid: Velocities in the left ICA are consistent with a 1-39%  stenosis.  Vertebrals:  Bilateral vertebral arteries demonstrate antegrade flow.  Subclavians: Normal flow hemodynamics were seen in bilateral subclavian               arteries.    Cardiac cath 06/18/21: CONCLUSIONS: 25% distal left main with small saccular aneurysm. 95% ostial LAD followed by 60 to 70% proximal to mid LAD after the first diagonal origin.  LAD is large and graftable. Ramus intermedius with 80% proximal stenosis Circumflex coronary artery contains 95% proximal stenosis before the first marginal which is relatively small.  The circumflex is then totally occluded after the first marginal and fills faintly by left to right collaterals. Mild diffuse disease in the proximal mid and distal RCA.  The PDA contains ostial 90% stenosis. Suggestion of faint mid inferior wall hypokinesis.  Overall EF normal at 55% with normal end-diastolic pressure.   RECOMMENDATIONS: Add low-dose beta-blocker therapy. Expedite outpatient surgical consultation for CABG of up to 5 bypasses.  Distal circumflex may not be graftable.   Echo  06/18/21: IMPRESSIONS   1. Left ventricular ejection fraction, by estimation, is 55 to 60%. The  left ventricle has normal function. The left ventricle has no regional  wall motion abnormalities. Left ventricular diastolic parameters are  consistent with Grade I diastolic  dysfunction (impaired relaxation).   2. Right ventricular systolic function is normal. The right ventricular  size is normal.   3. The mitral valve is normal in structure. No evidence of mitral valve  regurgitation. No evidence of mitral stenosis.   4. The aortic valve is normal in structure. Aortic valve regurgitation is  not visualized. No aortic  stenosis is present.   5. There is borderline dilatation of the aortic root, measuring 37 mm.   6. The inferior vena cava is normal in size with greater than 50%  respiratory variability, suggesting right atrial pressure of 3 mmHg.    Past Medical History:  Diagnosis Date   Anxiety    Arthritis    Basal cell carcinoma (BCC) of cheek 2021   Coronary artery disease 2022   Depression    Diabetes mellitus    GERD (gastroesophageal reflux disease)    Glaucoma    Hyperlipidemia    Hypertension    Hypoglycemia    Neuromuscular disorder ([place])    neuropathy feet    Past Surgical History:  Procedure Laterality Date   GLAUCOMA REPAIR  06/16/2004   lazer-low angle -both   HERNIA REPAIR Right 2018   inguinal   INGUINAL HERNIA REPAIR  12/02/2011   Procedure: HERNIA REPAIR INGUINAL ADULT;  Surgeon: [name], MD;  Location: [place];  Service: General;  Laterality: Left;  left inguinal   LEFT HEART CATH AND CORONARY ANGIOGRAPHY N/A 06/18/2021   Procedure: LEFT HEART CATH AND CORONARY ANGIOGRAPHY;  Surgeon: [name], MD;  Location: [place] [facility];  Service: Cardiovascular;  Laterality: N/A;   SIGMOIDOSCOPY     TONSILLECTOMY AND ADENOIDECTOMY      MEDICATIONS:  alclomethasone (ACLOVATE) 0.05 % cream   amLODipine (NORVASC) 5 MG tablet   aspirin EC 81 MG tablet   fexofenadine (ALLEGRA) 180 MG tablet   glucose blood test strip   isosorbide mononitrate (IMDUR) 30 MG 24 hr tablet   latanoprost (XALATAN) 0.005 % ophthalmic solution   LORazepam (ATIVAN) 0.5 MG tablet   meclizine (ANTIVERT) 25 MG tablet   metFORMIN (GLUCOPHAGE) 850 MG tablet   metoprolol succinate (TOPROL XL) 25 MG 24 hr tablet   Multiple Vitamin (MULTIVITAMIN WITH MINERALS) TABS   quinapril-hydrochlorothiazide (ACCURETIC) 20-12.5 MG per tablet   rosuvastatin (CRESTOR) 20 MG tablet   timolol (TIMOPTIC) 0.5 % ophthalmic solution    nitroGLYCERIN (NITROSTAT) SL tablet 0.4 mg    [START ON  06/25/2021] ceFAZolin (ANCEF) IVPB 2g/100 mL premix   [START ON 06/25/2021] ceFAZolin (ANCEF) IVPB 2g/100 mL premix   [START ON 06/25/2021] dexmedetomidine (PRECEDEX) 400 MCG/100ML (4 mcg/mL) infusion   [START ON 06/25/2021] EPINEPHrine (ADRENALIN) 5 mg in NS 250 mL (0.02 mg/mL) premix infusion   [START ON 06/25/2021] heparin 30,000 units/NS 1000 mL solution for CELLSAVER   [START ON 06/25/2021] heparin sodium (porcine) 5,000 Units, papaverine 60 mg in electrolyte-A (PLASMALYTE-A PH 7.4) 1,000 mL irrigation   [START ON 06/25/2021] insulin regular, human (MYXREDLIN) 100 units/ 100 mL infusion   [START ON 06/25/2021] magnesium sulfate (IV Push/IM) injection 40 mEq   [START ON 06/25/2021] milrinone (PRIMACOR) 20 MG/100 ML (0.2 mg/mL) infusion   [START ON 06/25/2021] nitroGLYCERIN 50 mg in dextrose 5 %  250 mL (0.2 mg/mL) infusion   [START ON 06/25/2021] norepinephrine (LEVOPHED) 4mg  in 246mL (0.016 mg/mL) premix infusion   [START ON 06/25/2021] phenylephrine (NEO-SYNEPHRINE) 20mg /NS 278mL premix infusion   [START ON 06/25/2021] potassium chloride injection 80 mEq   [START ON 06/25/2021] tranexamic acid (CYKLOKAPRON) 2,500 mg in sodium chloride 0.9 % 250 mL (10 mg/mL) infusion   [START ON 06/25/2021] tranexamic acid (CYKLOKAPRON) bolus via infusion - over 30 minutes 1,054.5 mg   [START ON 06/25/2021] tranexamic acid (CYKLOKAPRON) pump prime solution 141 mg   [START ON 06/25/2021] vancomycin (VANCOREADY) IVPB 1250 mg/250 mL    Myra Gianotti, PA-C Surgical Short Stay/Anesthesiology [facility] Phone [phone] [facility] Phone [phone] 06/24/2021 10:25 AM

## 2021-06-25 ENCOUNTER — Inpatient Hospital Stay (HOSPITAL_COMMUNITY)
Admission: RE | Admit: 2021-06-25 | Discharge: 2021-07-02 | DRG: 236 | Disposition: A | Discharge status: Home Health | Payer: Medicare Other | Attending: Cardiothoracic Surgery | Admitting: Cardiothoracic Surgery

## 2021-06-25 ENCOUNTER — Inpatient Hospital Stay (HOSPITAL_COMMUNITY): Payer: Medicare Other | Admitting: Anesthesiology

## 2021-06-25 ENCOUNTER — Encounter (HOSPITAL_COMMUNITY): Payer: Self-pay | Admitting: Cardiothoracic Surgery

## 2021-06-25 ENCOUNTER — Other Ambulatory Visit: Payer: Self-pay

## 2021-06-25 ENCOUNTER — Inpatient Hospital Stay (HOSPITAL_COMMUNITY): Payer: Medicare Other

## 2021-06-25 ENCOUNTER — Inpatient Hospital Stay (HOSPITAL_COMMUNITY)
Admission: RE | Disposition: A | Discharge status: Home Health | Payer: Self-pay | Source: Home / Self Care | Attending: Cardiothoracic Surgery

## 2021-06-25 ENCOUNTER — Inpatient Hospital Stay (HOSPITAL_COMMUNITY): Payer: Medicare Other | Admitting: Vascular Surgery

## 2021-06-25 DIAGNOSIS — R001 Bradycardia, unspecified: Secondary | ICD-10-CM | POA: Diagnosis not present

## 2021-06-25 DIAGNOSIS — D62 Acute posthemorrhagic anemia: Secondary | ICD-10-CM | POA: Diagnosis not present

## 2021-06-25 DIAGNOSIS — E785 Hyperlipidemia, unspecified: Secondary | ICD-10-CM | POA: Diagnosis present

## 2021-06-25 DIAGNOSIS — I200 Unstable angina: Secondary | ICD-10-CM | POA: Diagnosis present

## 2021-06-25 DIAGNOSIS — I4891 Unspecified atrial fibrillation: Secondary | ICD-10-CM | POA: Diagnosis not present

## 2021-06-25 DIAGNOSIS — D689 Coagulation defect, unspecified: Secondary | ICD-10-CM | POA: Diagnosis present

## 2021-06-25 DIAGNOSIS — R0902 Hypoxemia: Secondary | ICD-10-CM | POA: Diagnosis not present

## 2021-06-25 DIAGNOSIS — Z881 Allergy status to other antibiotic agents status: Secondary | ICD-10-CM

## 2021-06-25 DIAGNOSIS — I2510 Atherosclerotic heart disease of native coronary artery without angina pectoris: Secondary | ICD-10-CM

## 2021-06-25 DIAGNOSIS — F419 Anxiety disorder, unspecified: Secondary | ICD-10-CM | POA: Diagnosis present

## 2021-06-25 DIAGNOSIS — Z5181 Encounter for therapeutic drug level monitoring: Secondary | ICD-10-CM

## 2021-06-25 DIAGNOSIS — I97190 Other postprocedural cardiac functional disturbances following cardiac surgery: Secondary | ICD-10-CM | POA: Diagnosis not present

## 2021-06-25 DIAGNOSIS — Z85828 Personal history of other malignant neoplasm of skin: Secondary | ICD-10-CM | POA: Diagnosis not present

## 2021-06-25 DIAGNOSIS — I10 Essential (primary) hypertension: Secondary | ICD-10-CM | POA: Diagnosis present

## 2021-06-25 DIAGNOSIS — I25110 Atherosclerotic heart disease of native coronary artery with unstable angina pectoris: Secondary | ICD-10-CM | POA: Diagnosis present

## 2021-06-25 DIAGNOSIS — Q2112 Patent foramen ovale: Secondary | ICD-10-CM | POA: Diagnosis not present

## 2021-06-25 DIAGNOSIS — Z8249 Family history of ischemic heart disease and other diseases of the circulatory system: Secondary | ICD-10-CM | POA: Diagnosis not present

## 2021-06-25 DIAGNOSIS — H409 Unspecified glaucoma: Secondary | ICD-10-CM | POA: Diagnosis present

## 2021-06-25 DIAGNOSIS — M1990 Unspecified osteoarthritis, unspecified site: Secondary | ICD-10-CM | POA: Diagnosis present

## 2021-06-25 DIAGNOSIS — Z885 Allergy status to narcotic agent status: Secondary | ICD-10-CM | POA: Diagnosis not present

## 2021-06-25 DIAGNOSIS — K219 Gastro-esophageal reflux disease without esophagitis: Secondary | ICD-10-CM | POA: Diagnosis present

## 2021-06-25 DIAGNOSIS — I4892 Unspecified atrial flutter: Secondary | ICD-10-CM | POA: Diagnosis not present

## 2021-06-25 DIAGNOSIS — Z888 Allergy status to other drugs, medicaments and biological substances status: Secondary | ICD-10-CM

## 2021-06-25 DIAGNOSIS — E119 Type 2 diabetes mellitus without complications: Secondary | ICD-10-CM | POA: Diagnosis present

## 2021-06-25 DIAGNOSIS — Y838 Other surgical procedures as the cause of abnormal reaction of the patient, or of later complication, without mention of misadventure at the time of the procedure: Secondary | ICD-10-CM | POA: Diagnosis not present

## 2021-06-25 DIAGNOSIS — F32A Depression, unspecified: Secondary | ICD-10-CM | POA: Diagnosis present

## 2021-06-25 DIAGNOSIS — Z951 Presence of aortocoronary bypass graft: Principal | ICD-10-CM

## 2021-06-25 HISTORY — PX: ENDOVEIN HARVEST OF GREATER SAPHENOUS VEIN: SHX5059

## 2021-06-25 HISTORY — PX: CORONARY ARTERY BYPASS GRAFT: SHX141

## 2021-06-25 LAB — POCT I-STAT EG7
Acid-Base Excess: 1 mmol/L (ref 0.0–2.0)
Bicarbonate: 26.1 mmol/L (ref 20.0–28.0)
Calcium, Ion: 1.07 mmol/L — ABNORMAL LOW (ref 1.15–1.40)
HCT: 28 % — ABNORMAL LOW (ref 39.0–52.0)
Hemoglobin: 9.5 g/dL — ABNORMAL LOW (ref 13.0–17.0)
O2 Saturation: 85 %
Potassium: 4.1 mmol/L (ref 3.5–5.1)
Sodium: 135 mmol/L (ref 135–145)
TCO2: 27 mmol/L (ref 22–32)
pCO2, Ven: 42.8 mmHg — ABNORMAL LOW (ref 44.0–60.0)
pH, Ven: 7.394 (ref 7.250–7.430)
pO2, Ven: 50 mmHg — ABNORMAL HIGH (ref 32.0–45.0)

## 2021-06-25 LAB — POCT I-STAT 7, (LYTES, BLD GAS, ICA,H+H)
Acid-Base Excess: 4 mmol/L — ABNORMAL HIGH (ref 0.0–2.0)
Acid-Base Excess: 3 mmol/L — ABNORMAL HIGH (ref 0.0–2.0)
Acid-Base Excess: 3 mmol/L — ABNORMAL HIGH (ref 0.0–2.0)
Acid-Base Excess: 3 mmol/L — ABNORMAL HIGH (ref 0.0–2.0)
Acid-Base Excess: 4 mmol/L — ABNORMAL HIGH (ref 0.0–2.0)
Acid-Base Excess: 0 mmol/L (ref 0.0–2.0)
Acid-Base Excess: 2 mmol/L (ref 0.0–2.0)
Acid-base deficit: 2 mmol/L (ref 0.0–2.0)
Acid-base deficit: 1 mmol/L (ref 0.0–2.0)
Acid-base deficit: 3 mmol/L — ABNORMAL HIGH (ref 0.0–2.0)
Acid-base deficit: 6 mmol/L — ABNORMAL HIGH (ref 0.0–2.0)
Bicarbonate: 29.1 mmol/L — ABNORMAL HIGH (ref 20.0–28.0)
Bicarbonate: 27.1 mmol/L (ref 20.0–28.0)
Bicarbonate: 27.7 mmol/L (ref 20.0–28.0)
Bicarbonate: 27 mmol/L (ref 20.0–28.0)
Bicarbonate: 27.1 mmol/L (ref 20.0–28.0)
Bicarbonate: 24.2 mmol/L (ref 20.0–28.0)
Bicarbonate: 25.1 mmol/L (ref 20.0–28.0)
Bicarbonate: 22.4 mmol/L (ref 20.0–28.0)
Bicarbonate: 24.7 mmol/L (ref 20.0–28.0)
Bicarbonate: 22.3 mmol/L (ref 20.0–28.0)
Bicarbonate: 19.7 mmol/L — ABNORMAL LOW (ref 20.0–28.0)
Calcium, Ion: 1.22 mmol/L (ref 1.15–1.40)
Calcium, Ion: 0.91 mmol/L — ABNORMAL LOW (ref 1.15–1.40)
Calcium, Ion: 1.04 mmol/L — ABNORMAL LOW (ref 1.15–1.40)
Calcium, Ion: 0.95 mmol/L — ABNORMAL LOW (ref 1.15–1.40)
Calcium, Ion: 0.98 mmol/L — ABNORMAL LOW (ref 1.15–1.40)
Calcium, Ion: 0.86 mmol/L — CL (ref 1.15–1.40)
Calcium, Ion: 0.93 mmol/L — ABNORMAL LOW (ref 1.15–1.40)
Calcium, Ion: 1.13 mmol/L — ABNORMAL LOW (ref 1.15–1.40)
Calcium, Ion: 1.19 mmol/L (ref 1.15–1.40)
Calcium, Ion: 1.11 mmol/L — ABNORMAL LOW (ref 1.15–1.40)
Calcium, Ion: 1.05 mmol/L — ABNORMAL LOW (ref 1.15–1.40)
HCT: 35 % — ABNORMAL LOW (ref 39.0–52.0)
HCT: 24 % — ABNORMAL LOW (ref 39.0–52.0)
HCT: 27 % — ABNORMAL LOW (ref 39.0–52.0)
HCT: 22 % — ABNORMAL LOW (ref 39.0–52.0)
HCT: 24 % — ABNORMAL LOW (ref 39.0–52.0)
HCT: 21 % — ABNORMAL LOW (ref 39.0–52.0)
HCT: 25 % — ABNORMAL LOW (ref 39.0–52.0)
HCT: 25 % — ABNORMAL LOW (ref 39.0–52.0)
HCT: 30 % — ABNORMAL LOW (ref 39.0–52.0)
HCT: 26 % — ABNORMAL LOW (ref 39.0–52.0)
HCT: 24 % — ABNORMAL LOW (ref 39.0–52.0)
Hemoglobin: 11.9 g/dL — ABNORMAL LOW (ref 13.0–17.0)
Hemoglobin: 8.2 g/dL — ABNORMAL LOW (ref 13.0–17.0)
Hemoglobin: 9.2 g/dL — ABNORMAL LOW (ref 13.0–17.0)
Hemoglobin: 7.5 g/dL — ABNORMAL LOW (ref 13.0–17.0)
Hemoglobin: 8.2 g/dL — ABNORMAL LOW (ref 13.0–17.0)
Hemoglobin: 7.1 g/dL — ABNORMAL LOW (ref 13.0–17.0)
Hemoglobin: 8.5 g/dL — ABNORMAL LOW (ref 13.0–17.0)
Hemoglobin: 8.5 g/dL — ABNORMAL LOW (ref 13.0–17.0)
Hemoglobin: 10.2 g/dL — ABNORMAL LOW (ref 13.0–17.0)
Hemoglobin: 8.8 g/dL — ABNORMAL LOW (ref 13.0–17.0)
Hemoglobin: 8.2 g/dL — ABNORMAL LOW (ref 13.0–17.0)
O2 Saturation: 100 %
O2 Saturation: 100 %
O2 Saturation: 100 %
O2 Saturation: 100 %
O2 Saturation: 100 %
O2 Saturation: 100 %
O2 Saturation: 100 %
O2 Saturation: 99 %
O2 Saturation: 95 %
O2 Saturation: 98 %
O2 Saturation: 97 %
Patient temperature: 35.9
Patient temperature: 35.9
Patient temperature: 36.7
Potassium: 3.9 mmol/L (ref 3.5–5.1)
Potassium: 4.1 mmol/L (ref 3.5–5.1)
Potassium: 4.4 mmol/L (ref 3.5–5.1)
Potassium: 3.8 mmol/L (ref 3.5–5.1)
Potassium: 3.8 mmol/L (ref 3.5–5.1)
Potassium: 3.7 mmol/L (ref 3.5–5.1)
Potassium: 4 mmol/L (ref 3.5–5.1)
Potassium: 3.3 mmol/L — ABNORMAL LOW (ref 3.5–5.1)
Potassium: 3.2 mmol/L — ABNORMAL LOW (ref 3.5–5.1)
Potassium: 4.2 mmol/L (ref 3.5–5.1)
Potassium: 3.7 mmol/L (ref 3.5–5.1)
Sodium: 135 mmol/L (ref 135–145)
Sodium: 134 mmol/L — ABNORMAL LOW (ref 135–145)
Sodium: 134 mmol/L — ABNORMAL LOW (ref 135–145)
Sodium: 135 mmol/L (ref 135–145)
Sodium: 136 mmol/L (ref 135–145)
Sodium: 135 mmol/L (ref 135–145)
Sodium: 135 mmol/L (ref 135–145)
Sodium: 137 mmol/L (ref 135–145)
Sodium: 139 mmol/L (ref 135–145)
Sodium: 138 mmol/L (ref 135–145)
Sodium: 140 mmol/L (ref 135–145)
TCO2: 30 mmol/L (ref 22–32)
TCO2: 28 mmol/L (ref 22–32)
TCO2: 29 mmol/L (ref 22–32)
TCO2: 28 mmol/L (ref 22–32)
TCO2: 28 mmol/L (ref 22–32)
TCO2: 25 mmol/L (ref 22–32)
TCO2: 26 mmol/L (ref 22–32)
TCO2: 23 mmol/L (ref 22–32)
TCO2: 26 mmol/L (ref 22–32)
TCO2: 24 mmol/L (ref 22–32)
TCO2: 21 mmol/L — ABNORMAL LOW (ref 22–32)
pCO2 arterial: 45.2 mmHg (ref 32.0–48.0)
pCO2 arterial: 39.1 mmHg (ref 32.0–48.0)
pCO2 arterial: 43.1 mmHg (ref 32.0–48.0)
pCO2 arterial: 34.8 mmHg (ref 32.0–48.0)
pCO2 arterial: 37.3 mmHg (ref 32.0–48.0)
pCO2 arterial: 31.6 mmHg — ABNORMAL LOW (ref 32.0–48.0)
pCO2 arterial: 36.6 mmHg (ref 32.0–48.0)
pCO2 arterial: 37.3 mmHg (ref 32.0–48.0)
pCO2 arterial: 44.9 mmHg (ref 32.0–48.0)
pCO2 arterial: 39.8 mmHg (ref 32.0–48.0)
pCO2 arterial: 38.2 mmHg (ref 32.0–48.0)
pH, Arterial: 7.417 (ref 7.350–7.450)
pH, Arterial: 7.417 (ref 7.350–7.450)
pH, Arterial: 7.45 (ref 7.350–7.450)
pH, Arterial: 7.469 — ABNORMAL HIGH (ref 7.350–7.450)
pH, Arterial: 7.497 — ABNORMAL HIGH (ref 7.350–7.450)
pH, Arterial: 7.429 (ref 7.350–7.450)
pH, Arterial: 7.508 — ABNORMAL HIGH (ref 7.350–7.450)
pH, Arterial: 7.387 (ref 7.350–7.450)
pH, Arterial: 7.344 — ABNORMAL LOW (ref 7.350–7.450)
pH, Arterial: 7.352 (ref 7.350–7.450)
pH, Arterial: 7.319 — ABNORMAL LOW (ref 7.350–7.450)
pO2, Arterial: 574 mmHg — ABNORMAL HIGH (ref 83.0–108.0)
pO2, Arterial: 421 mmHg — ABNORMAL HIGH (ref 83.0–108.0)
pO2, Arterial: 450 mmHg — ABNORMAL HIGH (ref 83.0–108.0)
pO2, Arterial: 448 mmHg — ABNORMAL HIGH (ref 83.0–108.0)
pO2, Arterial: 457 mmHg — ABNORMAL HIGH (ref 83.0–108.0)
pO2, Arterial: 363 mmHg — ABNORMAL HIGH (ref 83.0–108.0)
pO2, Arterial: 380 mmHg — ABNORMAL HIGH (ref 83.0–108.0)
pO2, Arterial: 155 mmHg — ABNORMAL HIGH (ref 83.0–108.0)
pO2, Arterial: 74 mmHg — ABNORMAL LOW (ref 83.0–108.0)
pO2, Arterial: 99 mmHg (ref 83.0–108.0)
pO2, Arterial: 94 mmHg (ref 83.0–108.0)

## 2021-06-25 LAB — GLUCOSE, CAPILLARY
Glucose-Capillary: 156 mg/dL — ABNORMAL HIGH (ref 70–99)
Glucose-Capillary: 106 mg/dL — ABNORMAL HIGH (ref 70–99)
Glucose-Capillary: 121 mg/dL — ABNORMAL HIGH (ref 70–99)
Glucose-Capillary: 135 mg/dL — ABNORMAL HIGH (ref 70–99)
Glucose-Capillary: 109 mg/dL — ABNORMAL HIGH (ref 70–99)
Glucose-Capillary: 92 mg/dL (ref 70–99)
Glucose-Capillary: 125 mg/dL — ABNORMAL HIGH (ref 70–99)
Glucose-Capillary: 124 mg/dL — ABNORMAL HIGH (ref 70–99)
Glucose-Capillary: 161 mg/dL — ABNORMAL HIGH (ref 70–99)

## 2021-06-25 LAB — CBC
HCT: 31.7 % — ABNORMAL LOW (ref 39.0–52.0)
HCT: 29.6 % — ABNORMAL LOW (ref 39.0–52.0)
Hemoglobin: 11.1 g/dL — ABNORMAL LOW (ref 13.0–17.0)
Hemoglobin: 10.6 g/dL — ABNORMAL LOW (ref 13.0–17.0)
MCH: 31.6 pg (ref 26.0–34.0)
MCH: 32.5 pg (ref 26.0–34.0)
MCHC: 35 g/dL (ref 30.0–36.0)
MCHC: 35.8 g/dL (ref 30.0–36.0)
MCV: 90.3 fL (ref 80.0–100.0)
MCV: 90.8 fL (ref 80.0–100.0)
Platelets: 95 10*3/uL — ABNORMAL LOW (ref 150–400)
Platelets: 82 10*3/uL — ABNORMAL LOW (ref 150–400)
RBC: 3.51 MIL/uL — ABNORMAL LOW (ref 4.22–5.81)
RBC: 3.26 MIL/uL — ABNORMAL LOW (ref 4.22–5.81)
RDW: 12.1 % (ref 11.5–15.5)
RDW: 12.3 % (ref 11.5–15.5)
WBC: 9.7 10*3/uL (ref 4.0–10.5)
WBC: 8.5 10*3/uL (ref 4.0–10.5)
nRBC: 0 % (ref 0.0–0.2)
nRBC: 0 % (ref 0.0–0.2)

## 2021-06-25 LAB — POCT I-STAT, CHEM 8
BUN: 17 mg/dL (ref 8–23)
BUN: 16 mg/dL (ref 8–23)
BUN: 15 mg/dL (ref 8–23)
BUN: 14 mg/dL (ref 8–23)
BUN: 13 mg/dL (ref 8–23)
BUN: 13 mg/dL (ref 8–23)
BUN: 12 mg/dL (ref 8–23)
Calcium, Ion: 1.19 mmol/L (ref 1.15–1.40)
Calcium, Ion: 1.24 mmol/L (ref 1.15–1.40)
Calcium, Ion: 1.05 mmol/L — ABNORMAL LOW (ref 1.15–1.40)
Calcium, Ion: 0.96 mmol/L — ABNORMAL LOW (ref 1.15–1.40)
Calcium, Ion: 0.86 mmol/L — CL (ref 1.15–1.40)
Calcium, Ion: 0.91 mmol/L — ABNORMAL LOW (ref 1.15–1.40)
Calcium, Ion: 1.12 mmol/L — ABNORMAL LOW (ref 1.15–1.40)
Chloride: 98 mmol/L (ref 98–111)
Chloride: 99 mmol/L (ref 98–111)
Chloride: 98 mmol/L (ref 98–111)
Chloride: 98 mmol/L (ref 98–111)
Chloride: 98 mmol/L (ref 98–111)
Chloride: 98 mmol/L (ref 98–111)
Chloride: 100 mmol/L (ref 98–111)
Creatinine, Ser: 0.7 mg/dL (ref 0.61–1.24)
Creatinine, Ser: 0.6 mg/dL — ABNORMAL LOW (ref 0.61–1.24)
Creatinine, Ser: 0.6 mg/dL — ABNORMAL LOW (ref 0.61–1.24)
Creatinine, Ser: 0.6 mg/dL — ABNORMAL LOW (ref 0.61–1.24)
Creatinine, Ser: 0.5 mg/dL — ABNORMAL LOW (ref 0.61–1.24)
Creatinine, Ser: 0.5 mg/dL — ABNORMAL LOW (ref 0.61–1.24)
Creatinine, Ser: 0.5 mg/dL — ABNORMAL LOW (ref 0.61–1.24)
Glucose, Bld: 152 mg/dL — ABNORMAL HIGH (ref 70–99)
Glucose, Bld: 146 mg/dL — ABNORMAL HIGH (ref 70–99)
Glucose, Bld: 93 mg/dL (ref 70–99)
Glucose, Bld: 75 mg/dL (ref 70–99)
Glucose, Bld: 122 mg/dL — ABNORMAL HIGH (ref 70–99)
Glucose, Bld: 97 mg/dL (ref 70–99)
Glucose, Bld: 131 mg/dL — ABNORMAL HIGH (ref 70–99)
HCT: 37 % — ABNORMAL LOW (ref 39.0–52.0)
HCT: 34 % — ABNORMAL LOW (ref 39.0–52.0)
HCT: 26 % — ABNORMAL LOW (ref 39.0–52.0)
HCT: 21 % — ABNORMAL LOW (ref 39.0–52.0)
HCT: 21 % — ABNORMAL LOW (ref 39.0–52.0)
HCT: 25 % — ABNORMAL LOW (ref 39.0–52.0)
HCT: 25 % — ABNORMAL LOW (ref 39.0–52.0)
Hemoglobin: 12.6 g/dL — ABNORMAL LOW (ref 13.0–17.0)
Hemoglobin: 11.6 g/dL — ABNORMAL LOW (ref 13.0–17.0)
Hemoglobin: 8.8 g/dL — ABNORMAL LOW (ref 13.0–17.0)
Hemoglobin: 7.1 g/dL — ABNORMAL LOW (ref 13.0–17.0)
Hemoglobin: 7.1 g/dL — ABNORMAL LOW (ref 13.0–17.0)
Hemoglobin: 8.5 g/dL — ABNORMAL LOW (ref 13.0–17.0)
Hemoglobin: 8.5 g/dL — ABNORMAL LOW (ref 13.0–17.0)
Potassium: 3.9 mmol/L (ref 3.5–5.1)
Potassium: 4.1 mmol/L (ref 3.5–5.1)
Potassium: 4.1 mmol/L (ref 3.5–5.1)
Potassium: 3.7 mmol/L (ref 3.5–5.1)
Potassium: 3.7 mmol/L (ref 3.5–5.1)
Potassium: 4 mmol/L (ref 3.5–5.1)
Potassium: 3.2 mmol/L — ABNORMAL LOW (ref 3.5–5.1)
Sodium: 135 mmol/L (ref 135–145)
Sodium: 134 mmol/L — ABNORMAL LOW (ref 135–145)
Sodium: 134 mmol/L — ABNORMAL LOW (ref 135–145)
Sodium: 136 mmol/L (ref 135–145)
Sodium: 135 mmol/L (ref 135–145)
Sodium: 135 mmol/L (ref 135–145)
Sodium: 136 mmol/L (ref 135–145)
TCO2: 28 mmol/L (ref 22–32)
TCO2: 29 mmol/L (ref 22–32)
TCO2: 27 mmol/L (ref 22–32)
TCO2: 26 mmol/L (ref 22–32)
TCO2: 24 mmol/L (ref 22–32)
TCO2: 25 mmol/L (ref 22–32)
TCO2: 23 mmol/L (ref 22–32)

## 2021-06-25 LAB — ABO/RH: ABO/RH(D): A POS

## 2021-06-25 LAB — BASIC METABOLIC PANEL
Anion gap: 7 (ref 5–15)
BUN: 12 mg/dL (ref 8–23)
CO2: 21 mmol/L — ABNORMAL LOW (ref 22–32)
Calcium: 7.4 mg/dL — ABNORMAL LOW (ref 8.9–10.3)
Chloride: 104 mmol/L (ref 98–111)
Creatinine, Ser: 0.93 mg/dL (ref 0.61–1.24)
GFR, Estimated: >60 mL/min (ref >60)
Glucose, Bld: 118 mg/dL — ABNORMAL HIGH (ref 70–99)
Potassium: 4.2 mmol/L (ref 3.5–5.1)
Sodium: 132 mmol/L — ABNORMAL LOW (ref 135–145)

## 2021-06-25 LAB — HEMOGLOBIN AND HEMATOCRIT, BLOOD
HCT: 21.7 % — ABNORMAL LOW (ref 39.0–52.0)
Hemoglobin: 7.9 g/dL — ABNORMAL LOW (ref 13.0–17.0)

## 2021-06-25 LAB — PROTIME-INR
INR: 1.6 — ABNORMAL HIGH (ref 0.8–1.2)
Prothrombin Time: 18.6 seconds — ABNORMAL HIGH (ref 11.4–15.2)

## 2021-06-25 LAB — MAGNESIUM: Magnesium: 2.7 mg/dL — ABNORMAL HIGH (ref 1.7–2.4)

## 2021-06-25 LAB — PLATELET COUNT: Platelets: 140 10*3/uL — ABNORMAL LOW (ref 150–400)

## 2021-06-25 LAB — PREPARE RBC (CROSSMATCH)

## 2021-06-25 LAB — APTT: aPTT: 28 seconds (ref 24–36)

## 2021-06-25 SURGERY — CORONARY ARTERY BYPASS GRAFTING (CABG)
Anesthesia: General | Site: Leg Lower | Laterality: Right

## 2021-06-25 MED ORDER — TRANEXAMIC ACID-NACL 1000-0.7 MG/100ML-% IV SOLN
INTRAVENOUS | Status: AC
  Filled 2021-06-25: qty 100

## 2021-06-25 MED ORDER — DEXTROSE 50 % IV SOLN: 0.0000 mL | INTRAVENOUS | Status: DC | PRN

## 2021-06-25 MED ORDER — PHENYLEPHRINE HCL-NACL 20-0.9 MG/250ML-% IV SOLN
0.0000 ug/min | INTRAVENOUS | Status: DC
  Administered 2021-06-25: 10 ug/min via INTRAVENOUS
  Filled 2021-06-25: qty 250

## 2021-06-25 MED ORDER — PROTAMINE SULFATE 10 MG/ML IV SOLN
INTRAVENOUS | Status: AC
  Filled 2021-06-25: qty 25

## 2021-06-25 MED ORDER — MIDAZOLAM HCL (PF) 5 MG/ML IJ SOLN
INTRAMUSCULAR | Status: DC | PRN
  Administered 2021-06-25 (×5): 2 mg via INTRAVENOUS

## 2021-06-25 MED ORDER — FENTANYL CITRATE (PF) 250 MCG/5ML IJ SOLN
INTRAMUSCULAR | Status: AC
  Filled 2021-06-25: qty 5

## 2021-06-25 MED ORDER — PLASMA-LYTE A IV SOLN: INTRAVENOUS | Status: DC | PRN

## 2021-06-25 MED ORDER — PROTAMINE SULFATE 10 MG/ML IV SOLN
INTRAVENOUS | Status: DC | PRN
  Administered 2021-06-25: 30 mg via INTRAVENOUS
  Administered 2021-06-25: 250 mg via INTRAVENOUS

## 2021-06-25 MED ORDER — METOPROLOL TARTRATE 25 MG/10 ML ORAL SUSPENSION: 12.5000 mg | Freq: Two times a day (BID) | ORAL | Status: DC

## 2021-06-25 MED ORDER — ONDANSETRON HCL 4 MG/2ML IJ SOLN
INTRAMUSCULAR | Status: AC
  Filled 2021-06-25: qty 2

## 2021-06-25 MED ORDER — SODIUM CHLORIDE 0.45 % IV SOLN: INTRAVENOUS | Status: DC | PRN

## 2021-06-25 MED ORDER — SODIUM CHLORIDE 0.9% FLUSH: 3.0000 mL | INTRAVENOUS | Status: DC | PRN

## 2021-06-25 MED ORDER — SODIUM CHLORIDE (PF) 0.9 % IJ SOLN
INTRAMUSCULAR | Status: AC
  Filled 2021-06-25: qty 10

## 2021-06-25 MED ORDER — DEXMEDETOMIDINE HCL IN NACL 400 MCG/100ML IV SOLN
0.0000 ug/kg/h | INTRAVENOUS | Status: DC
  Filled 2021-06-25: qty 100

## 2021-06-25 MED ORDER — CEFAZOLIN SODIUM-DEXTROSE 2-4 GM/100ML-% IV SOLN
2.0000 g | Freq: Three times a day (TID) | INTRAVENOUS | Status: AC
  Administered 2021-06-25 – 2021-06-27 (×6): 2 g via INTRAVENOUS
  Filled 2021-06-25 (×7): qty 100

## 2021-06-25 MED ORDER — ROCURONIUM BROMIDE 10 MG/ML (PF) SYRINGE
PREFILLED_SYRINGE | INTRAVENOUS | Status: AC
  Filled 2021-06-25: qty 10

## 2021-06-25 MED ORDER — ALBUMIN HUMAN 5 % IV SOLN
250.0000 mL | INTRAVENOUS | Status: AC | PRN
  Administered 2021-06-25 (×4): 12.5 g via INTRAVENOUS
  Filled 2021-06-25 (×2): qty 250

## 2021-06-25 MED ORDER — CHLORHEXIDINE GLUCONATE 0.12 % MT SOLN
15.0000 mL | OROMUCOSAL | Status: AC
  Administered 2021-06-25: 15 mL via OROMUCOSAL

## 2021-06-25 MED ORDER — CHLORHEXIDINE GLUCONATE 4 % EX LIQD: 30.0000 mL | CUTANEOUS | Status: DC

## 2021-06-25 MED ORDER — ALBUMIN HUMAN 5 % IV SOLN: INTRAVENOUS | Status: DC | PRN

## 2021-06-25 MED ORDER — VANCOMYCIN HCL IN DEXTROSE 1-5 GM/200ML-% IV SOLN
1000.0000 mg | Freq: Once | INTRAVENOUS | Status: AC
  Administered 2021-06-25: 1000 mg via INTRAVENOUS
  Filled 2021-06-25: qty 200

## 2021-06-25 MED ORDER — FAMOTIDINE IN NACL 20-0.9 MG/50ML-% IV SOLN
20.0000 mg | Freq: Two times a day (BID) | INTRAVENOUS | Status: DC
  Administered 2021-06-25: 20 mg via INTRAVENOUS

## 2021-06-25 MED ORDER — GLYCOPYRROLATE PF 0.2 MG/ML IJ SOSY
PREFILLED_SYRINGE | INTRAMUSCULAR | Status: AC
  Filled 2021-06-25: qty 1

## 2021-06-25 MED ORDER — HEPARIN SODIUM (PORCINE) 1000 UNIT/ML IJ SOLN
INTRAMUSCULAR | Status: DC | PRN
  Administered 2021-06-25: 4000 [IU] via INTRAVENOUS
  Administered 2021-06-25: 21000 [IU] via INTRAVENOUS

## 2021-06-25 MED ORDER — LATANOPROST 0.005 % OP SOLN
1.0000 [drp] | Freq: Every day | OPHTHALMIC | Status: DC
  Administered 2021-06-25 – 2021-07-01 (×7): 1 [drp] via OPHTHALMIC
  Filled 2021-06-25 (×2): qty 2.5

## 2021-06-25 MED ORDER — CHLORHEXIDINE GLUCONATE 0.12% ORAL RINSE (MEDLINE KIT)
15.0000 mL | Freq: Two times a day (BID) | OROMUCOSAL | Status: DC
  Administered 2021-06-25 – 2021-06-26 (×3): 15 mL via OROMUCOSAL

## 2021-06-25 MED ORDER — EPHEDRINE SULFATE-NACL 50-0.9 MG/10ML-% IV SOSY
PREFILLED_SYRINGE | INTRAVENOUS | Status: DC | PRN
  Administered 2021-06-25 (×4): 5 mg via INTRAVENOUS

## 2021-06-25 MED ORDER — DOCUSATE SODIUM 100 MG PO CAPS
200.0000 mg | ORAL_CAPSULE | Freq: Every day | ORAL | Status: DC
  Administered 2021-06-26 – 2021-06-27 (×2): 200 mg via ORAL
  Filled 2021-06-25 (×2): qty 2

## 2021-06-25 MED ORDER — MAGNESIUM SULFATE 4 GM/100ML IV SOLN
4.0000 g | Freq: Once | INTRAVENOUS | Status: AC
  Administered 2021-06-25: 4 g via INTRAVENOUS
  Filled 2021-06-25: qty 100

## 2021-06-25 MED ORDER — SODIUM CHLORIDE 0.9% IV SOLUTION: Freq: Once | INTRAVENOUS | Status: DC

## 2021-06-25 MED ORDER — ASPIRIN EC 325 MG PO TBEC
325.0000 mg | DELAYED_RELEASE_TABLET | Freq: Every day | ORAL | Status: DC
  Administered 2021-06-26 – 2021-07-02 (×7): 325 mg via ORAL
  Filled 2021-06-25 (×7): qty 1

## 2021-06-25 MED ORDER — METOPROLOL TARTRATE 12.5 MG HALF TABLET: 12.5000 mg | ORAL_TABLET | Freq: Once | ORAL | Status: DC

## 2021-06-25 MED ORDER — DOBUTAMINE IN D5W 4-5 MG/ML-% IV SOLN
2.5000 ug/kg/min | INTRAVENOUS | Status: DC
  Administered 2021-06-25: 2.5 ug/kg/min via INTRAVENOUS
  Filled 2021-06-25: qty 250

## 2021-06-25 MED ORDER — NITROGLYCERIN IN D5W 200-5 MCG/ML-% IV SOLN
INTRAVENOUS | Status: AC
  Filled 2021-06-25: qty 250

## 2021-06-25 MED ORDER — LACTATED RINGERS IV SOLN: INTRAVENOUS | Status: DC | PRN

## 2021-06-25 MED ORDER — CALCIUM CHLORIDE 10 % IV SOLN
INTRAVENOUS | Status: AC
  Filled 2021-06-25: qty 10

## 2021-06-25 MED ORDER — SUCCINYLCHOLINE CHLORIDE 200 MG/10ML IV SOSY
PREFILLED_SYRINGE | INTRAVENOUS | Status: AC
  Filled 2021-06-25: qty 10

## 2021-06-25 MED ORDER — ROCURONIUM BROMIDE 10 MG/ML (PF) SYRINGE
PREFILLED_SYRINGE | INTRAVENOUS | Status: DC | PRN
  Administered 2021-06-25 (×2): 50 mg via INTRAVENOUS
  Administered 2021-06-25: 100 mg via INTRAVENOUS

## 2021-06-25 MED ORDER — ACETAMINOPHEN 500 MG PO TABS
1000.0000 mg | ORAL_TABLET | Freq: Four times a day (QID) | ORAL | Status: AC
  Administered 2021-06-26 – 2021-06-30 (×18): 1000 mg via ORAL
  Filled 2021-06-25 (×18): qty 2

## 2021-06-25 MED ORDER — ROSUVASTATIN CALCIUM 20 MG PO TABS
20.0000 mg | ORAL_TABLET | Freq: Every day | ORAL | Status: DC
  Administered 2021-06-26 – 2021-07-02 (×7): 20 mg via ORAL
  Filled 2021-06-25 (×7): qty 1

## 2021-06-25 MED ORDER — METOPROLOL TARTRATE 12.5 MG HALF TABLET
12.5000 mg | ORAL_TABLET | Freq: Two times a day (BID) | ORAL | Status: DC
  Administered 2021-06-26 – 2021-06-27 (×4): 12.5 mg via ORAL
  Filled 2021-06-25 (×4): qty 1

## 2021-06-25 MED ORDER — BISACODYL 5 MG PO TBEC
10.0000 mg | DELAYED_RELEASE_TABLET | Freq: Every day | ORAL | Status: DC
  Administered 2021-06-26 – 2021-06-27 (×2): 10 mg via ORAL
  Filled 2021-06-25 (×2): qty 2

## 2021-06-25 MED ORDER — ORAL CARE MOUTH RINSE
15.0000 mL | OROMUCOSAL | Status: DC
  Administered 2021-06-25: 15 mL via OROMUCOSAL

## 2021-06-25 MED ORDER — CHLORHEXIDINE GLUCONATE 0.12 % MT SOLN
15.0000 mL | Freq: Once | OROMUCOSAL | Status: AC
  Administered 2021-06-25: 15 mL via OROMUCOSAL
  Filled 2021-06-25: qty 15

## 2021-06-25 MED ORDER — BISACODYL 10 MG RE SUPP: 10.0000 mg | Freq: Every day | RECTAL | Status: DC

## 2021-06-25 MED ORDER — HEMOSTATIC AGENTS (NO CHARGE) OPTIME
TOPICAL | Status: DC | PRN
Start: 2021-06-25 — End: 2021-06-25
  Administered 2021-06-25: 1 via TOPICAL

## 2021-06-25 MED ORDER — HEPARIN SODIUM (PORCINE) 1000 UNIT/ML IJ SOLN
INTRAMUSCULAR | Status: AC
  Filled 2021-06-25: qty 1

## 2021-06-25 MED ORDER — PROPOFOL 10 MG/ML IV BOLUS
INTRAVENOUS | Status: DC | PRN
  Administered 2021-06-25: 110 mg via INTRAVENOUS

## 2021-06-25 MED ORDER — FENTANYL CITRATE (PF) 250 MCG/5ML IJ SOLN
INTRAMUSCULAR | Status: DC | PRN
  Administered 2021-06-25 (×2): 100 ug via INTRAVENOUS
  Administered 2021-06-25 (×2): 150 ug via INTRAVENOUS
  Administered 2021-06-25: 50 ug via INTRAVENOUS
  Administered 2021-06-25: 150 ug via INTRAVENOUS
  Administered 2021-06-25: 100 ug via INTRAVENOUS
  Administered 2021-06-25: 50 ug via INTRAVENOUS

## 2021-06-25 MED ORDER — ORAL CARE MOUTH RINSE
15.0000 mL | Freq: Two times a day (BID) | OROMUCOSAL | Status: DC
  Administered 2021-06-26: 15 mL via OROMUCOSAL

## 2021-06-25 MED ORDER — LACTATED RINGERS IV SOLN: 500.0000 mL | Freq: Once | INTRAVENOUS | Status: DC | PRN

## 2021-06-25 MED ORDER — ACETAMINOPHEN 160 MG/5ML PO SOLN
650.0000 mg | Freq: Once | ORAL | Status: AC
  Administered 2021-06-25: 650 mg

## 2021-06-25 MED ORDER — SURGIFLO WITH THROMBIN (HEMOSTATIC MATRIX KIT) OPTIME
TOPICAL | Status: DC | PRN
Start: 2021-06-25 — End: 2021-06-25
  Administered 2021-06-25: 1 via TOPICAL

## 2021-06-25 MED ORDER — ~~LOC~~ CARDIAC SURGERY, PATIENT & FAMILY EDUCATION
Freq: Once | Status: DC
  Filled 2021-06-25: qty 1

## 2021-06-25 MED ORDER — SODIUM CHLORIDE 0.9% FLUSH: 10.0000 mL | INTRAVENOUS | Status: DC | PRN

## 2021-06-25 MED ORDER — ACETAMINOPHEN 650 MG RE SUPP: 650.0000 mg | Freq: Once | RECTAL | Status: AC

## 2021-06-25 MED ORDER — MIDAZOLAM HCL 2 MG/2ML IJ SOLN: 2.0000 mg | INTRAMUSCULAR | Status: DC | PRN

## 2021-06-25 MED ORDER — NITROGLYCERIN IN D5W 200-5 MCG/ML-% IV SOLN: 0.0000 ug/min | INTRAVENOUS | Status: DC

## 2021-06-25 MED ORDER — SODIUM CHLORIDE 0.9% FLUSH
3.0000 mL | Freq: Two times a day (BID) | INTRAVENOUS | Status: DC
  Administered 2021-06-26 – 2021-07-01 (×9): 3 mL via INTRAVENOUS

## 2021-06-25 MED ORDER — SODIUM CHLORIDE 0.9 % IV SOLN
20.0000 ug | Freq: Once | INTRAVENOUS | Status: AC
  Administered 2021-06-25: 20 ug via INTRAVENOUS
  Filled 2021-06-25: qty 5

## 2021-06-25 MED ORDER — ASPIRIN 81 MG PO CHEW: 324.0000 mg | CHEWABLE_TABLET | Freq: Every day | ORAL | Status: DC

## 2021-06-25 MED ORDER — NEOSTIGMINE METHYLSULFATE 3 MG/3ML IV SOSY
PREFILLED_SYRINGE | INTRAVENOUS | Status: AC
  Filled 2021-06-25: qty 3

## 2021-06-25 MED ORDER — ARTIFICIAL TEARS OPHTHALMIC OINT
TOPICAL_OINTMENT | OPHTHALMIC | Status: AC
  Filled 2021-06-25: qty 3.5

## 2021-06-25 MED ORDER — INSULIN REGULAR(HUMAN) IN NACL 100-0.9 UT/100ML-% IV SOLN: INTRAVENOUS | Status: DC

## 2021-06-25 MED ORDER — SODIUM CHLORIDE 0.9 % IV SOLN: 250.0000 mL | INTRAVENOUS | Status: DC

## 2021-06-25 MED ORDER — PROPOFOL 10 MG/ML IV BOLUS
INTRAVENOUS | Status: AC
  Filled 2021-06-25: qty 20

## 2021-06-25 MED ORDER — PHENYLEPHRINE 40 MCG/ML (10ML) SYRINGE FOR IV PUSH (FOR BLOOD PRESSURE SUPPORT)
PREFILLED_SYRINGE | INTRAVENOUS | Status: AC
  Filled 2021-06-25: qty 10

## 2021-06-25 MED ORDER — LACTATED RINGERS IV SOLN: INTRAVENOUS | Status: DC

## 2021-06-25 MED ORDER — ACETAMINOPHEN 160 MG/5ML PO SOLN: 1000.0000 mg | Freq: Four times a day (QID) | ORAL | Status: AC

## 2021-06-25 MED ORDER — PROTAMINE SULFATE 10 MG/ML IV SOLN
INTRAVENOUS | Status: AC
  Filled 2021-06-25: qty 5

## 2021-06-25 MED ORDER — DEXMEDETOMIDINE HCL IN NACL 400 MCG/100ML IV SOLN
INTRAVENOUS | Status: AC
  Filled 2021-06-25: qty 100

## 2021-06-25 MED ORDER — POTASSIUM CHLORIDE 10 MEQ/50ML IV SOLN
10.0000 meq | INTRAVENOUS | Status: AC
  Administered 2021-06-25 (×3): 10 meq via INTRAVENOUS

## 2021-06-25 MED ORDER — MIDAZOLAM HCL (PF) 10 MG/2ML IJ SOLN
INTRAMUSCULAR | Status: AC
  Filled 2021-06-25: qty 2

## 2021-06-25 MED ORDER — EPHEDRINE 5 MG/ML INJ
INTRAVENOUS | Status: AC
  Filled 2021-06-25: qty 5

## 2021-06-25 MED ORDER — CHLORHEXIDINE GLUCONATE CLOTH 2 % EX PADS
6.0000 | MEDICATED_PAD | Freq: Every day | CUTANEOUS | Status: DC
  Administered 2021-06-25 – 2021-07-02 (×8): 6 via TOPICAL

## 2021-06-25 MED ORDER — SODIUM CHLORIDE (PF) 0.9 % IJ SOLN
OROMUCOSAL | Status: DC | PRN
  Administered 2021-06-25 (×3): 4 mL via TOPICAL

## 2021-06-25 MED ORDER — 0.9 % SODIUM CHLORIDE (POUR BTL) OPTIME
TOPICAL | Status: DC | PRN
  Administered 2021-06-25: 6000 mL

## 2021-06-25 MED ORDER — OXYCODONE HCL 5 MG PO TABS
5.0000 mg | ORAL_TABLET | ORAL | Status: DC | PRN
  Administered 2021-06-26: 5 mg via ORAL
  Filled 2021-06-25: qty 1

## 2021-06-25 MED ORDER — METOPROLOL TARTRATE 5 MG/5ML IV SOLN
2.5000 mg | INTRAVENOUS | Status: DC | PRN
  Filled 2021-06-25: qty 5

## 2021-06-25 MED ORDER — MORPHINE SULFATE (PF) 2 MG/ML IV SOLN
1.0000 mg | INTRAVENOUS | Status: DC | PRN
  Administered 2021-06-25: 2 mg via INTRAVENOUS
  Filled 2021-06-25: qty 1

## 2021-06-25 MED ORDER — ONDANSETRON HCL 4 MG/2ML IJ SOLN: 4.0000 mg | Freq: Four times a day (QID) | INTRAMUSCULAR | Status: DC | PRN

## 2021-06-25 MED ORDER — CALCIUM CHLORIDE 10 % IV SOLN
INTRAVENOUS | Status: DC | PRN
  Administered 2021-06-25: 200 mg via INTRAVENOUS
  Administered 2021-06-25: 100 mg via INTRAVENOUS
  Administered 2021-06-25: 200 mg via INTRAVENOUS

## 2021-06-25 MED ORDER — LIDOCAINE 2% (20 MG/ML) 5 ML SYRINGE
INTRAMUSCULAR | Status: AC
  Filled 2021-06-25: qty 5

## 2021-06-25 MED ORDER — TRAMADOL HCL 50 MG PO TABS
50.0000 mg | ORAL_TABLET | ORAL | Status: DC | PRN
  Administered 2021-06-25 – 2021-06-26 (×2): 100 mg via ORAL
  Filled 2021-06-25 (×3): qty 2

## 2021-06-25 MED ORDER — SODIUM CHLORIDE 0.9 % IV SOLN: INTRAVENOUS | Status: DC

## 2021-06-25 MED ORDER — PANTOPRAZOLE SODIUM 40 MG PO TBEC
40.0000 mg | DELAYED_RELEASE_TABLET | Freq: Every day | ORAL | Status: DC
  Administered 2021-06-27 – 2021-07-02 (×6): 40 mg via ORAL
  Filled 2021-06-25 (×6): qty 1

## 2021-06-25 SURGICAL SUPPLY — 73 items
ADAPTER CARDIO PERF ANTE/RETRO (ADAPTER) ×4 IMPLANT
BAG DECANTER FOR FLEXI CONT (MISCELLANEOUS) ×4 IMPLANT
BLADE CLIPPER SURG (BLADE) ×4 IMPLANT
BLADE STERNUM SYSTEM 6 (BLADE) ×4 IMPLANT
BLADE SURG 12 STRL SS (BLADE) ×4 IMPLANT
BNDG ELASTIC 4X5.8 VLCR STR LF (GAUZE/BANDAGES/DRESSINGS) ×4 IMPLANT
BNDG ELASTIC 6X5.8 VLCR STR LF (GAUZE/BANDAGES/DRESSINGS) ×4 IMPLANT
BNDG GAUZE ELAST 4 BULKY (GAUZE/BANDAGES/DRESSINGS) ×4 IMPLANT
CANISTER SUCT 3000ML PPV (MISCELLANEOUS) ×4 IMPLANT
CANNULA GUNDRY RCSP 15FR (MISCELLANEOUS) ×4 IMPLANT
CATH CPB KIT VANTRIGT (MISCELLANEOUS) ×4 IMPLANT
CATH ROBINSON RED A/P 18FR (CATHETERS) ×12 IMPLANT
CATH THORACIC 28FR RT ANG (CATHETERS) ×8 IMPLANT
CLIP FOGARTY SPRING 6M (CLIP) ×2 IMPLANT
DRAIN CHANNEL 32F RND 10.7 FF (WOUND CARE) ×4 IMPLANT
DRAPE CARDIOVASCULAR INCISE (DRAPES) ×4
DRAPE SLUSH/WARMER DISC (DRAPES) ×4 IMPLANT
DRAPE SRG 135X102X78XABS (DRAPES) ×3 IMPLANT
DRSG AQUACEL AG ADV 3.5X14 (GAUZE/BANDAGES/DRESSINGS) ×4 IMPLANT
ELECT BLADE 4.0 EZ CLEAN MEGAD (MISCELLANEOUS) ×4
ELECT BLADE 6.5 EXT (BLADE) ×4 IMPLANT
ELECT CAUTERY BLADE 6.4 (BLADE) ×4 IMPLANT
ELECT REM PT RETURN 9FT ADLT (ELECTROSURGICAL) ×8
ELECTRODE BLDE 4.0 EZ CLN MEGD (MISCELLANEOUS) ×3 IMPLANT
ELECTRODE REM PT RTRN 9FT ADLT (ELECTROSURGICAL) ×6 IMPLANT
FELT TEFLON 1X6 (MISCELLANEOUS) ×8 IMPLANT
GAUZE SPONGE 4X4 12PLY STRL (GAUZE/BANDAGES/DRESSINGS) ×8 IMPLANT
GLOVE SURG ENC MOIS LTX SZ7.5 (GLOVE) ×12 IMPLANT
GOWN STRL REUS W/ TWL LRG LVL3 (GOWN DISPOSABLE) ×16 IMPLANT
GOWN STRL REUS W/TWL LRG LVL3 (GOWN DISPOSABLE) ×32
HEMOSTAT POWDER SURGIFOAM 1G (HEMOSTASIS) ×12 IMPLANT
HEMOSTAT SURGICEL 2X14 (HEMOSTASIS) ×4 IMPLANT
KIT BASIN OR (CUSTOM PROCEDURE TRAY) ×4 IMPLANT
KIT SUCTION CATH 14FR (SUCTIONS) ×4 IMPLANT
KIT TURNOVER KIT B (KITS) ×4 IMPLANT
KIT VASOVIEW HEMOPRO 2 VH 4000 (KITS) ×4 IMPLANT
LEAD PACING MYOCARDI (MISCELLANEOUS) ×4 IMPLANT
MARKER GRAFT CORONARY BYPASS (MISCELLANEOUS) ×12 IMPLANT
NS IRRIG 1000ML POUR BTL (IV SOLUTION) ×22 IMPLANT
PACK E OPEN HEART (SUTURE) ×4 IMPLANT
PACK OPEN HEART (CUSTOM PROCEDURE TRAY) ×4 IMPLANT
PAD ARMBOARD 7.5X6 YLW CONV (MISCELLANEOUS) ×8 IMPLANT
PAD ELECT DEFIB RADIOL ZOLL (MISCELLANEOUS) ×4 IMPLANT
PENCIL BUTTON HOLSTER BLD 10FT (ELECTRODE) ×4 IMPLANT
POSITIONER HEAD DONUT 9IN (MISCELLANEOUS) ×4 IMPLANT
PUNCH AORTIC ROTATE 4.5MM 8IN (MISCELLANEOUS) ×2 IMPLANT
SET MPS 3-ND DEL (MISCELLANEOUS) ×2 IMPLANT
SUPPORT HEART JANKE-BARRON (MISCELLANEOUS) ×4 IMPLANT
SUT BONE WAX W31G (SUTURE) ×4 IMPLANT
SUT MNCRL AB 4-0 PS2 18 (SUTURE) ×2 IMPLANT
SUT PROLENE 3 0 SH DA (SUTURE) ×2 IMPLANT
SUT PROLENE 4 0 RB 1 (SUTURE) ×8
SUT PROLENE 4 0 SH DA (SUTURE) ×4 IMPLANT
SUT PROLENE 4-0 RB1 .5 CRCL 36 (SUTURE) ×4 IMPLANT
SUT PROLENE 6 0 C 1 30 (SUTURE) ×12 IMPLANT
SUT PROLENE 6 0 CC (SUTURE) ×10 IMPLANT
SUT PROLENE BLUE 7 0 (SUTURE) ×4 IMPLANT
SUT PROLENE POLY MONO (SUTURE) ×2 IMPLANT
SUT STEEL 6MS V (SUTURE) ×6 IMPLANT
SUT STEEL SZ 6 DBL 3X14 BALL (SUTURE) ×4 IMPLANT
SUT VIC AB 1 CTX 36 (SUTURE) ×12
SUT VIC AB 1 CTX36XBRD ANBCTR (SUTURE) ×7 IMPLANT
SUT VIC AB 2-0 CTX 27 (SUTURE) ×2 IMPLANT
SUT VIC AB 3-0 X1 27 (SUTURE) ×2 IMPLANT
SYSTEM SAHARA CHEST DRAIN ATS (WOUND CARE) ×4 IMPLANT
TAPE CLOTH SURG 4X10 WHT LF (GAUZE/BANDAGES/DRESSINGS) ×4 IMPLANT
TAPE PAPER 2X10 WHT MICROPORE (GAUZE/BANDAGES/DRESSINGS) ×2 IMPLANT
TOWEL GREEN STERILE (TOWEL DISPOSABLE) ×4 IMPLANT
TOWEL GREEN STERILE FF (TOWEL DISPOSABLE) ×4 IMPLANT
TRAY FOLEY SLVR 16FR TEMP STAT (SET/KITS/TRAYS/PACK) ×4 IMPLANT
TUBING LAP HI FLOW INSUFFLATIO (TUBING) ×4 IMPLANT
UNDERPAD 30X36 HEAVY ABSORB (UNDERPADS AND DIAPERS) ×4 IMPLANT
WATER STERILE IRR 1000ML POUR (IV SOLUTION) ×8 IMPLANT

## 2021-06-25 NOTE — Hospital Course (Addendum)
PCP is Shirline Frees, MD Referring Provider : Dr. Daneen Schick  History of Present Illness:  66 year old well-controlled type II diabetic presents for evaluation of recently diagnosed severe multivessel coronary disease with class III symptoms of angina.  He has a history of hypertension and hyperlipidemia.  He is not overweight 80 watches his diet and exercises daily with scheduled walks.  He recently developed substernal discomfort associated with exertion and decreased exercise tolerance.  Cardiac CT scan was performed which showed fairly severe distal circumflex disease and a small PFO.  He underwent left heart cath by Dr. Tamala Julian which showed severe three-vessel coronary disease with a 95% proximal LAD, 90% proximal circumflex stenosis, distal circumflex occlusion beyond the first marginal with atretic distal circumflex vessels.  The right coronary has a 95% stenosis of the proximal posterior descending.  LVEF appears normal and LVEDP was less than 10 mmHg.Marland Kitchen  Patient has been recommended for multivessel coronary bypass grafting.   There is no history of venous disease phlebitis or varicose veins.  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  He also had a negative acetylcholine receptor antibody to rule out myasthenia.  Patient has not had any major previous surgery.  He denies smoking or pulmonary problems.  He is right-hand dominant and is alone and is retired. He has never had COVID and has received 4 vaccines  After evaluation by Dr. Darcey Nora in the office, coronary bypass grafting was offered and Mr. Brenneman decided to proceed with surgery.    Hospital Course: Mr. Folz was admitted to the hospital for elective surgery on 06/25/21 where CABG x 4 was accomplished.  Following the procedure he separated from cardiopulmonary bypass without difficulty. He was  transfused with FFP prior to transfer to the ICU. Hemodynamics remained stable. He was weaned from the mechanical ventilator support during the afternoon following surgery and was extubated routinely by 6:30pm. The monitoring lines and chest tubes were removed on the first post-op day and he was mobilized.  He was atrially paced for the first postop day due to bradycardia.  His rhythm recovered to stable sinus rhythm in the mid 70s by the second postoperative day so the pacer was discontinued.  Diuresis was initiated for expected volume excess.  He was transferred to 4E progressive care on second postoperative day.

## 2021-06-25 NOTE — Progress Notes (Signed)
Patient ID: Sean Kirby, male   DOB: 09-08-1955, 66 y.o.   MRN: [id number]  TCTS Evening Rounds:   Hemodynamically stable  CI = 2.9  Has started to wake up on vent. Getting ready to extubate.  Urine output good  CT output low  CBC    Component Value Date/Time   WBC 9.7 06/25/2021 1506   RBC 3.51 (L) 06/25/2021 1506   HGB 11.1 (L) 06/25/2021 1506   HGB 15.4 06/13/2021 1421   HCT 31.7 (L) 06/25/2021 1506   HCT 42.6 06/13/2021 1421   PLT 95 (L) 06/25/2021 1506   PLT 234 06/13/2021 1421   MCV 90.3 06/25/2021 1506   MCV 90 06/13/2021 1421   MCH 31.6 06/25/2021 1506   MCHC 35.0 06/25/2021 1506   RDW 12.1 06/25/2021 1506   RDW 11.6 06/13/2021 1421   LYMPHSABS 1.7 06/13/2021 1421   MONOABS 0.5 07/08/2012 0913   EOSABS 0.6 (H) 06/13/2021 1421   BASOSABS 0.0 06/13/2021 1421     BMET    Component Value Date/Time   NA 137 06/25/2021 1337   NA 137 06/07/2021 0856   K 3.3 (L) 06/25/2021 1337   CL 100 06/25/2021 1334   CO2 23 06/21/2021 1537   GLUCOSE 131 (H) 06/25/2021 1334   BUN 12 06/25/2021 1334   BUN 13 06/07/2021 0856   CREATININE 0.50 (L) 06/25/2021 1334   CALCIUM 9.6 06/21/2021 1537   EGFR 94 06/07/2021 0856   GFRNONAA >60 06/21/2021 1537     A/P:  Stable postop course. Continue current plans

## 2021-06-25 NOTE — Progress Notes (Signed)
°  Transition of Care [facility]) Screening Note   Patient Details  Name: MACALLAN ORD Date of Birth: Sep 12, 1955   Transition of Care [facility]) CM/SW Contact:    Milas Gain, [place] Phone Number: 06/25/2021, 5:26 PM    Transition of Care Department [facility]) has reviewed patient and no TOC needs have been identified at this time. We will continue to monitor patient advancement through interdisciplinary progression rounds. If new patient transition needs arise, please place a TOC consult.

## 2021-06-25 NOTE — Transfer of Care (Signed)
Immediate Anesthesia Transfer of Care Note  Patient: NORLAN RANN  Procedure(s) Performed: CORONARY ARTERY BYPASS GRAFTING (CABG) X4 USING LEFT INTERNAL MAMMARY ARTERY AND ENDOSCOPICALLY HARVESTED RIGHT GREATER SAPHENOUS VEIN (Chest) ENDOVEIN HARVEST OF GREATER SAPHENOUS VEIN (Right: Leg Lower)  Patient Location: SICU  Anesthesia Type:General  Level of Consciousness: Patient remains intubated per anesthesia plan  Airway & Oxygen Therapy: Patient remains intubated per anesthesia plan and Patient placed on Ventilator (see vital sign flow sheet for setting)  Post-op Assessment: Report given to RN and Post -op Vital signs reviewed and stable  Post vital signs: Reviewed and stable  Last Vitals:  Vitals Value Taken Time  BP    Temp    Pulse 83 06/25/21 1457  Resp 14 06/25/21 1457  SpO2 95 % 06/25/21 1457  Vitals shown include unvalidated device data.  Last Pain:  Vitals:   06/25/21 0618  TempSrc:   PainSc: 0-No pain         Complications: No notable events documented.

## 2021-06-25 NOTE — Brief Op Note (Addendum)
06/25/2021  12:40 PM  PATIENT:  Sean Kirby  66 y.o. male  PRE-OPERATIVE DIAGNOSIS:  CORONARY ARTERY DISEASE  POST-OPERATIVE DIAGNOSIS:  CORONARY ARTERY DISEASE  PROCEDURES:  CORONARY ARTERY BYPASS GRAFTING (CABG) X4 USING LEFT INTERNAL MAMMARY ARTERY AND ENDOSCOPICALLY HARVESTED RIGHT GREATER SAPHENOUS VEIN   LIMA-LAD SVG-[place] Int. SVG-OM SVG-PDA  Vein harvest time: 57min Vein prep time:  46min  TRANSESOPHAGEAL ECHOCARDIOGRAM ENDOVEIN HARVEST OF GREATER SAPHENOUS VEIN (Rt)  SURGEON:  [name], MD - Primary  PHYSICIAN ASSISTANT: Amen Dargis  ANESTHESIA:   general  BLOOD ADMINISTERED: 2 units FFP, 2 units PRBC's  DRAINS:  Bilateral right angle pleural drains and mediastinal drain    LOCAL MEDICATIONS USED:  NONE  SPECIMEN:  No Specimen  DISPOSITION OF SPECIMEN:  N/A  COUNTS:  YES  DICTATION: .Dragon Dictation  PLAN OF CARE: Admit to inpatient   PATIENT DISPOSITION:  ICU - intubated and hemodynamically stable.   Delay start of Pharmacological VTE agent (>24hrs) due to surgical blood loss or risk of bleeding: yes

## 2021-06-25 NOTE — Anesthesia Procedure Notes (Addendum)
Arterial Line Insertion Start/End1/03/2022 7:13 AM Performed by: Kyung Rudd, CRNA, CRNA  Preanesthetic checklist: patient identified, IV checked, site marked, risks and benefits discussed, surgical consent, monitors and equipment checked, pre-op evaluation, timeout performed and anesthesia consent Lidocaine 1% used for infiltration and patient sedated Left, radial was placed Catheter size: 20 G Hand hygiene performed  and Seldinger technique used Allen's test indicative of satisfactory collateral circulation Procedure performed without using ultrasound guided technique. Following insertion, dressing applied and Biopatch. Post procedure assessment: normal  Patient tolerated the procedure well with no immediate complications.

## 2021-06-25 NOTE — H&P (Signed)
PCP is Shirline Frees, MD Referring Provider is No ref. provider found  No chief complaint on file.   HPI: Patient examined, images of coronary arteriogram and echocardiogram and cardiac CT scan personally reviewed and counseled with patient and his friend in attendance, Shanon Brow.  66 year old well-controlled type II diabetic presents for evaluation of recently diagnosed severe multivessel coronary disease with class III symptoms of angina.  He has a history of hypertension and hyperlipidemia.  He is not overweight 80 watches his diet and exercises daily with scheduled walks.  He recently developed substernal discomfort associated with exertion and decreased exercise tolerance.  Cardiac CT scan was performed which showed fairly severe distal circumflex disease and a small PFO.  He underwent left heart cath by Dr. Tamala Julian which showed severe three-vessel coronary disease with a 95% proximal LAD, 90% proximal circumflex stenosis, distal circumflex occlusion beyond the first marginal with atretic distal circumflex vessels.  The right coronary has a 95% stenosis of the proximal posterior descending.  LVEF appears normal and LVEDP was less than 10 mmHg.Marland Kitchen  Patient has been recommended for multivessel coronary bypass grafting.  There is no history of venous disease phlebitis or varicose veins.  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  He also had a negative acetylcholine receptor antibody to rule out myasthenia.  Patient has not had any major previous surgery.  He denies smoking or pulmonary problems.  He is right-hand dominant and is alone and is retired. He has never had COVID and has received 4 vaccines   Past Medical History:  Diagnosis Date   Anxiety    Arthritis    Basal cell carcinoma (BCC) of cheek 2021   Coronary artery disease 2022   Depression    Diabetes mellitus     GERD (gastroesophageal reflux disease)    Glaucoma    Hyperlipidemia    Hypertension    Hypoglycemia    Neuromuscular disorder ([place])    neuropathy feet    Past Surgical History:  Procedure Laterality Date   GLAUCOMA REPAIR  06/16/2004   lazer-low angle -both   HERNIA REPAIR Right 2018   inguinal   INGUINAL HERNIA REPAIR  12/02/2011   Procedure: HERNIA REPAIR INGUINAL ADULT;  Surgeon: [name], MD;  Location: [place];  Service: General;  Laterality: Left;  left inguinal   LEFT HEART CATH AND CORONARY ANGIOGRAPHY N/A 06/18/2021   Procedure: LEFT HEART CATH AND CORONARY ANGIOGRAPHY;  Surgeon: [name], MD;  Location: [place] [facility];  Service: Cardiovascular;  Laterality: N/A;   SIGMOIDOSCOPY     TONSILLECTOMY AND ADENOIDECTOMY      Family History  Problem Relation Age of Onset   Heart disease Mother    Heart disease Maternal Aunt    Heart disease Maternal Grandmother    Colon cancer Neg Hx     Social History Social History   Tobacco Use   Smoking status: Never   Smokeless tobacco: Never  Vaping Use   Vaping Use: Never used  Substance Use Topics   Alcohol use: No   Drug use: No    Current Facility-Administered Medications  Medication Dose Route Frequency Provider Last Rate Last Admin   ceFAZolin (ANCEF) IVPB 2g/100 mL premix  2 g Intravenous To OR [name], MD       ceFAZolin (ANCEF) IVPB 2g/100 mL premix  2 g Intravenous To OR  [name], MD       chlorhexidine (HIBICLENS) 4 % liquid 2 application  30 mL Topical UD [name], MD       [facility], Patient & Family Education   Does not apply Once [name], MD       dexmedetomidine (PRECEDEX) 400 MCG/100ML (4 mcg/mL) infusion  0.1-0.7 mcg/kg/hr Intravenous To OR [name], MD       EPINEPHrine (ADRENALIN) 5 mg in NS 250 mL (0.02 mg/mL) premix infusion  0-10 mcg/min Intravenous To OR [name], MD       heparin 30,000 units/NS  1000 mL solution for CELLSAVER   Other To OR [name], MD       heparin sodium (porcine) 5,000 Units, papaverine 60 mg in electrolyte-A (PLASMALYTE-A PH 7.4) 1,000 mL irrigation   Irrigation To OR [name], MD       insulin regular, human (MYXREDLIN) 100 units/ 100 mL infusion   Intravenous To OR [name], MD       magnesium sulfate (IV Push/IM) injection 40 mEq  40 mEq Other To OR [name], MD       metoprolol tartrate (LOPRESSOR) tablet 12.5 mg  12.5 mg Oral Once [name], MD       milrinone (PRIMACOR) 20 MG/100 ML (0.2 mg/mL) infusion  0.3 mcg/kg/min Intravenous To OR [name], MD       nitroGLYCERIN 50 mg in dextrose 5 % 250 mL (0.2 mg/mL) infusion  2-200 mcg/min Intravenous To OR [name], MD       norepinephrine (LEVOPHED) 4mg  in 266mL (0.016 mg/mL) premix infusion  0-40 mcg/min Intravenous To OR [name], MD       phenylephrine (NEO-SYNEPHRINE) 20mg /NS 275mL premix infusion  30-200 mcg/min Intravenous To OR [name], MD       potassium chloride injection 80 mEq  80 mEq Other To OR [name], MD       tranexamic acid (CYKLOKAPRON) 2,500 mg in sodium chloride 0.9 % 250 mL (10 mg/mL) infusion  1.5 mg/kg/hr Intravenous To OR [name], MD       tranexamic acid (CYKLOKAPRON) bolus via infusion - over 30 minutes 1,054.5 mg  15 mg/kg Intravenous To OR [name], MD       tranexamic acid (CYKLOKAPRON) pump prime solution 141 mg  2 mg/kg Intracatheter To OR [name], MD       Facility-Administered Medications Ordered in Other Encounters  Medication Dose Route Frequency Provider Last Rate Last Admin   fentaNYL citrate (PF) (SUBLIMAZE) injection   Intravenous Anesthesia Intra-op Kyung Rudd, CRNA   100 mcg at 06/25/21 9485   lactated ringers infusion   Intravenous Continuous PRN Kyung Rudd, CRNA   New Bag at 06/25/21 4627   lactated ringers infusion   Intravenous Continuous PRN Kyung Rudd, CRNA   New Bag at  06/25/21 0350   lactated ringers infusion   Intravenous Continuous PRN Kyung Rudd, CRNA   New Bag at 06/25/21 0736   midazolam PF (VERSED) injection   Intravenous Anesthesia Intra-op Kyung Rudd, CRNA   2 mg at 06/25/21 0817   propofol (DIPRIVAN) 10 mg/mL bolus/IV push   Intravenous Anesthesia Intra-op Kyung Rudd, CRNA   110 mg at 06/25/21 0817   rocuronium bromide 10 mg/mL (PF) syringe   Intravenous Anesthesia Intra-op Kyung Rudd, CRNA   100 mg at 06/25/21 0938    Allergies  Allergen Reactions   Codeine  Patient unsure of reaction.   Septra [Sulfamethoxazole-Trimethoprim]     Patient unsure of reaction.   Dapagliflozin Other (See Comments)    (Farxiga) Dizziness & low BP   Sertraline Hcl Other (See Comments)    diarrhea   Glimepiride Other (See Comments)    Sugar bottoms out   Simvastatin Diarrhea    Review of Systems: No visual symptoms after resolution of left trochlear nerve dysfunction last year no difficulty swallowing or dental complaints No history of thoracic trauma or pneumothorax No history of atrial fibrillation No history of jaundice hepatitis blood per rectum no history of kidney stones hematuria or prostate tumor No complaints of polyuria or dysphagia with well-controlled diabetes hemoglobin A1c 7.1  BP 139/78    Pulse (!) 56    Temp 98.8 F (37.1 C) (Oral)    Resp 11    Ht 5\' 6"  (1.676 m)    Wt 70.8 kg    SpO2 99%    BMI 25.18 kg/m  Physical Exam:      Exam    General- alert and comfortable    Neck- no JVD, no cervical adenopathy palpable, no carotid bruit   Lungs- clear without rales, wheezes.  Slight pectus deformity.   Cor- regular rate and rhythm, no murmur , gallop   Abdomen- soft, non-tender   Extremities - warm, non-tender, minimal edema   Neuro- oriented, appropriate, no focal weakness    Diagnostic Tests: Cardiac catheterization so severe three-vessel coronary disease with graftable vessels to the LAD, ramus intermediate, OM1, and  posterior descending.  Echocardiogram shows mild LVH with normal systolic function without valvular disease.  Impression: Severe multivessel coronary disease with symptoms of class III angina Fairly well-controlled type 2 diabetes mellitus Hypertension   Plan: I agree that multivessel coronary bypass grafting is the best treatment for this patient's severe three-vessel disease preserved LV function and diabetes.  I have discussed the expected benefits of the procedure with the patient including preservation of LV function, improve survival, and improvement in his symptoms.  He understands the risks of cardiac bypass surgery including risks of infection, stroke, postoperative arrhythmias, renal failure and organ failure, and death.  He demonstrates his understanding and agrees to proceed with surgery which was scheduled at [facility] on January 10.  His preoperative evaluation will be arranged to the office before that time.  He will stop his metformin 48 hours prior to surgery.   [name], MD [facility] [phone]  06-25-2021  Preop labs satisfactory Patient examined and ready for CABG  P Prescott Gum MD

## 2021-06-25 NOTE — Progress Notes (Signed)
Pre Procedure note for inpatients:   Sean Kirby has been scheduled for Procedure(s): CORONARY ARTERY BYPASS GRAFTING (CABG) (N/A) TRANSESOPHAGEAL ECHOCARDIOGRAM (TEE) (N/A) today. The various methods of treatment have been discussed with the patient. After consideration of the risks, benefits and treatment options the patient has consented to the planned procedure.   The patient has been seen and labs reviewed. There are no changes in the patients condition to prevent proceeding with the planned procedure today.  Recent labs:  Lab Results  Component Value Date   WBC 8.0 06/21/2021   HGB 14.4 06/21/2021   HCT 41.0 06/21/2021   PLT 232 06/21/2021   GLUCOSE 175 (H) 06/21/2021   ALT 21 06/21/2021   AST 24 06/21/2021   NA 130 (L) 06/21/2021   K 4.1 06/21/2021   CL 99 06/21/2021   CREATININE 0.83 06/21/2021   BUN 14 06/21/2021   CO2 23 06/21/2021   TSH 2.205 05/07/2021   INR 1.0 06/21/2021   HGBA1C 6.9 (H) 06/21/2021    [name], MD 06/25/2021 7:50 AM

## 2021-06-25 NOTE — Anesthesia Procedure Notes (Signed)
Central Venous Catheter Insertion Performed by: Myrtie Soman, MD, anesthesiologist Start/End1/03/2022 6:57 AM, 06/25/2021 7:15 AM Patient location: Pre-op. Preanesthetic checklist: patient identified, IV checked, site marked, risks and benefits discussed, surgical consent, monitors and equipment checked, pre-op evaluation, timeout performed and anesthesia consent Position: Trendelenburg Lidocaine 1% used for infiltration and patient sedated Hand hygiene performed  and maximum sterile barriers used  Catheter size: 8.5 Fr Sheath introducer PA Cath depth:42 Procedure performed using ultrasound guided technique. Ultrasound Notes:anatomy identified, needle tip was noted to be adjacent to the nerve/plexus identified, no ultrasound evidence of intravascular and/or intraneural injection and image(s) printed for medical record Attempts: 1 Following insertion, line sutured, dressing applied and Biopatch. Post procedure assessment: blood return through all ports, free fluid flow and no air  Patient tolerated the procedure well with no immediate complications.

## 2021-06-25 NOTE — Anesthesia Procedure Notes (Signed)
Procedure Name: Intubation Date/Time: 06/25/2021 8:22 AM Performed by: Kyung Rudd, CRNA Pre-anesthesia Checklist: Patient identified, Emergency Drugs available, Suction available and Patient being monitored Patient Re-evaluated:Patient Re-evaluated prior to induction Oxygen Delivery Method: Circle system utilized Preoxygenation: Pre-oxygenation with 100% oxygen Induction Type: IV induction Ventilation: Mask ventilation without difficulty and Oral airway inserted - appropriate to patient size Laryngoscope Size: Miller and 2 Grade View: Grade III Tube type: Oral Tube size: 7.5 mm Number of attempts: 1 Airway Equipment and Method: Stylet and Oral airway Placement Confirmation: ETT inserted through vocal cords under direct vision, positive ETCO2 and breath sounds checked- equal and bilateral Secured at: 23 cm Tube secured with: Tape Dental Injury: Teeth and Oropharynx as per pre-operative assessment  Comments: Miller 2 by Fernanda Drum resulted in grade 2b view. First attempt success.

## 2021-06-25 NOTE — Anesthesia Procedure Notes (Signed)
Anesthesia Procedure Image    

## 2021-06-25 NOTE — Op Note (Addendum)
NAME: Sean Kirby, FAVORITE MEDICAL RECORD NO: [id number] ACCOUNT NO: [id number] DATE OF BIRTH: 23-Jul-1955 FACILITY: [facility] LOCATION: [facility] PHYSICIAN: Ivin Poot III, MD  Operative Report   DATE OF PROCEDURE: 06/25/2021  PROCEDURES PERFORMED: 1. Coronary artery bypass grafting x 4 (left internal mammary artery to the LAD, saphenous vein graft to the ramus intermediate, saphenous vein graft to the circumflex marginal, saphenous vein graft to the posterior descending).  2. Endoscopic harvest of right leg greater saphenous vein.   PREOPERATIVE DIAGNOSES: Severe 3-vessel coronary artery disease with history of type 2 diabetes and class 3 progressive angina.  POSTOPERATIVE DIAGNOSES: Severe 3-vessel coronary artery disease with history of type 2 diabetes and class 3 progressive angina.  SURGEON:  Ivin Poot, MD  FIRST ASSISTANT: Enid Cutter, PA-C.  An experienced assistant was required for the procedure given the standard of surgical care and the complexity of the operation.  The assistant was needed for exposure, dissection,retraction, accurate suctioning, keeping the anastomotic sutures properly aligned and for overall help during the operation.  Anesthesia:  General by Myrtie Soman, M.D.  CLINICAL NOTE:  The patient is a 66 year old type 2 diabetic who was recently diagnosed with severe 3-vessel coronary artery disease after he presented with episodes of shortness of breath, palpitations, and exertional chest discomfort.  Cardiac  catheterization demonstrated 95% LAD stenosis, 90% stenosis of the ramus intermediate, 90% stenosis in the ramus and a 90% stenosis of the posterior descending.  LVEF was preserved and there is no significant valvular disease on echo.  He was recommended  for multivessel coronary artery bypass grafting.  I saw the patient in consultation in the office and reviewed the results of his echocardiogram images and  cardiac catheterization images.  I discussed  the role of coronary artery bypass surgery for  treatment of his severe 3-vessel coronary artery disease and we discussed the details of surgery to include the use of general anesthesia and cardiopulmonary bypass, the location of the surgical incisions and the expected postoperative hospital recovery.   He understood that the expected benefits of the surgery would include relief of symptoms, improve survival and preservation of LV function.  He understood the risks of the surgery to include the potential for bleeding, blood transfusion, MI, stroke,  organ failure, infection, and death.  He demonstrated his understanding of these issues and agreed to proceed with surgery under what I felt was an informed consent.  OPERATIVE FINDINGS:   1.  Severe diabetic 3-vessel disease. 2.  Good conduit. 3.  Small circumflex marginal target, which was adequate for grafting. 4.  Post-pump coagulopathy requiring FFP and blood transfusion therapy.  DESCRIPTION OF PROCEDURE:  The patient was brought from preoperative holding where informed consent was documented.  Final issues were discussed with the patient.  He was placed supine on the operating table and general anesthesia was induced under  invasive hemodynamic monitoring. An attempt by Dr. Kalman Shan of Anesthesia to place a transesophageal echo was unsuccessful after several attempts and the decision was made to proceed with the operation without echo monitoring.  The patient was then prepped  and draped as a sterile field and his preoperative antibiotics were administered and the Foley catheter was placed.  A proper timeout was performed.  A sternal incision was made as the saphenous vein was harvested endoscopically from the right leg by the first assistant and the leg was ultimately closed in a standard fashion..  The left internal mammary artery was harvested as a pedicle graft from  its origin at the subclavian vessels.  It was 1.5 mm vessel with good flow.  The   sternal retractor was placed and the pericardium was opened and suspended.  The ascending aorta was noted to have minimal plaque or dilatation.  Pursestrings were placed in the ascending aorta and right atrium and after heparin was administered and ACT  was documented as being greater than 400 seconds, the patient was cannulated and placed on bypass.  The coronaries were identified for grafting and the mammary artery and vein grafts were prepared for the distal anastomosis.  Cardioplegia cannula was  placed in the ascending aorta.  The patient was cooled to 32 degrees.  The aortic crossclamp was applied and 800 mL of cold blood cardioplegia was delivered with good cardioplegic arrest and supple temperature dropping less than 12 degrees.  Supple  temperature was monitored throughout the operation and cardioplegia was delivered every 20-30 minutes as needed.  The distal coronary anastomoses were performed.  The first assistant was needed for each distal anastomosis to keep the anastomotic sutures properly And under the correct amount of tension.  The first distal anastomosis was the posterior descending branch of the right coronary.  This was a large 1.7 mm vessel with proximal 95% stenosis at the origin of the posterior descending.  A reverse  saphenous vein was sewn end-to-side with running 7-0 Prolene.  There was good flow through the graft.  Cardioplegia was redosed.  A second distal anastomosis was to the circumflex marginal.  This was a small 1 mm vessel with a proximal 90% stenosis.  A reverse saphenous vein was sewn end-to-side with some difficulty due to the small caliber of the vessel and the significant disease  of the vessel wall.  The anastomosis was completed satisfactorily with good flow through the graft and cardioplegia was redosed.  The third distal anastomosis was to the ramus intermediate branch of the left coronary.  This was a larger 1.5 mm vessel with a proximal 80-90% stenosis.  A  reverse saphenous vein was sewn end-to-side with a running 7-0 Prolene with good flow through the  graft.  Cardioplegia was redosed.  The fourth distal anastomosis was through the distal LAD.  This was a 1.5 mm vessel with proximal 95% stenosis.  The left IMA pedicle was brought through an opening and the left lower pericardium was brought down onto the LAD and sewn end-to-side with  running 7-0 Prolene.  There was good flow through the anastomosis after briefly releasing the bulldog on the mammary pedicle.  There was good flow and the pedicle was reapplied.  The pedicle was secured to the epicardium with 6-0 Prolene sutures.   Cardioplegia was redosed.  With the cross-clamp still in place, 3 proximal vein anastomoses were placed on the ascending aorta with a 4.5 mm punch and a running 6-0 Prolene.  Prior to tying down the final proximal anastomosis, air was vented from the coronaries and the left side  of the heart with the usual de-airing maneuvers.  The crossclamp was removed.  The vein grafts were deaired and opened.  Each had good flow and hemostasis was documented to the proximal and distal sites.  The heart resumed a spontaneous rhythm.  The patient was rewarmed and reperfused, temporary pacing wires were applied. When the  patient was adequately reperfused and rewarmed, the lungs were expanded and the ventilator was resumed.  The patient was then weaned off cardiopulmonary bypass without needing inotropic support.  Hemodynamics were stable.  Protamine was administered  slowly without adverse reaction.  There was still diffuse oozing and for that reason, the patient received some FFP with improved coagulation function.  The anterior mediastinal fat was closed over the aorta and vein grafts.  Bilateral pleural tubes and anterior mediastinal chest tube were placed and brought out through separate incisions.  The sternum was reapproximated with interrupted steel wire.  The  pectoralis fascia was  closed with a running Vicryl.  Subcutaneous and skin layers were closed using running Vicryl and sterile dressings were applied.  The patient was then returned to the ICU in critical but stable condition after an orogastric tube  was passed into the stomach by anesthesia team.  Total cardiopulmonary bypass time was 150 minutes.    MUK D: 06/25/2021 5:26:12 pm T: 06/25/2021 11:47:00 pm  JOB: [id number]/ [id number]

## 2021-06-25 NOTE — Procedures (Addendum)
Extubation Procedure Note  Patient Details:   Name: Sean Kirby DOB: April 25, 1956 MRN: [id number]   Airway Documentation:    Vent end date: 06/25/21 Vent end time: 1836   Evaluation  O2 sats: stable throughout Complications: No apparent complications Patient did tolerate procedure well. Bilateral Breath Sounds: Clear, Diminished   Pt extubated to 6L [place] per rapid wean protocol. NIF was -20 and VC was 1.3 L. Pt had positive cuff leak prior to extubation. No stridor noted. Pt able to voice his name.  Vilinda Blanks 06/25/2021, 6:36 PM

## 2021-06-26 ENCOUNTER — Inpatient Hospital Stay (HOSPITAL_COMMUNITY): Payer: Medicare Other

## 2021-06-26 ENCOUNTER — Encounter (HOSPITAL_COMMUNITY): Payer: Self-pay | Admitting: Cardiothoracic Surgery

## 2021-06-26 LAB — BPAM RBC
Blood Product Expiration Date: 202302022359
Blood Product Expiration Date: 202302022359
ISSUE DATE / TIME: 202301101227
ISSUE DATE / TIME: 202301101227
Unit Type and Rh: 6200
Unit Type and Rh: 6200

## 2021-06-26 LAB — BASIC METABOLIC PANEL
Anion gap: 7 (ref 5–15)
Anion gap: 9 (ref 5–15)
BUN: 12 mg/dL (ref 8–23)
BUN: 16 mg/dL (ref 8–23)
CO2: 21 mmol/L — ABNORMAL LOW (ref 22–32)
CO2: 23 mmol/L (ref 22–32)
Calcium: 7.6 mg/dL — ABNORMAL LOW (ref 8.9–10.3)
Calcium: 8.2 mg/dL — ABNORMAL LOW (ref 8.9–10.3)
Chloride: 105 mmol/L (ref 98–111)
Chloride: 98 mmol/L (ref 98–111)
Creatinine, Ser: 1.01 mg/dL (ref 0.61–1.24)
Creatinine, Ser: 1.01 mg/dL (ref 0.61–1.24)
GFR, Estimated: >60 mL/min (ref >60)
GFR, Estimated: >60 mL/min (ref >60)
Glucose, Bld: 126 mg/dL — ABNORMAL HIGH (ref 70–99)
Glucose, Bld: 217 mg/dL — ABNORMAL HIGH (ref 70–99)
Potassium: 4.1 mmol/L (ref 3.5–5.1)
Potassium: 4.1 mmol/L (ref 3.5–5.1)
Sodium: 133 mmol/L — ABNORMAL LOW (ref 135–145)
Sodium: 130 mmol/L — ABNORMAL LOW (ref 135–145)

## 2021-06-26 LAB — CBC
HCT: 29.3 % — ABNORMAL LOW (ref 39.0–52.0)
HCT: 31.6 % — ABNORMAL LOW (ref 39.0–52.0)
Hemoglobin: 10.5 g/dL — ABNORMAL LOW (ref 13.0–17.0)
Hemoglobin: 11.3 g/dL — ABNORMAL LOW (ref 13.0–17.0)
MCH: 32.4 pg (ref 26.0–34.0)
MCH: 32.8 pg (ref 26.0–34.0)
MCHC: 35.8 g/dL (ref 30.0–36.0)
MCHC: 35.8 g/dL (ref 30.0–36.0)
MCV: 90.4 fL (ref 80.0–100.0)
MCV: 91.6 fL (ref 80.0–100.0)
Platelets: 98 10*3/uL — ABNORMAL LOW (ref 150–400)
Platelets: 111 10*3/uL — ABNORMAL LOW (ref 150–400)
RBC: 3.24 MIL/uL — ABNORMAL LOW (ref 4.22–5.81)
RBC: 3.45 MIL/uL — ABNORMAL LOW (ref 4.22–5.81)
RDW: 12.4 % (ref 11.5–15.5)
RDW: 12.7 % (ref 11.5–15.5)
WBC: 9.6 10*3/uL (ref 4.0–10.5)
WBC: 12.2 10*3/uL — ABNORMAL HIGH (ref 4.0–10.5)
nRBC: 0 % (ref 0.0–0.2)
nRBC: 0 % (ref 0.0–0.2)

## 2021-06-26 LAB — BPAM FFP
Blood Product Expiration Date: 202301112359
Blood Product Expiration Date: 202301112359
ISSUE DATE / TIME: 202301101230
ISSUE DATE / TIME: 202301101230
Unit Type and Rh: 6200
Unit Type and Rh: 6200

## 2021-06-26 LAB — GLUCOSE, CAPILLARY
Glucose-Capillary: 106 mg/dL — ABNORMAL HIGH (ref 70–99)
Glucose-Capillary: 134 mg/dL — ABNORMAL HIGH (ref 70–99)
Glucose-Capillary: 137 mg/dL — ABNORMAL HIGH (ref 70–99)
Glucose-Capillary: 137 mg/dL — ABNORMAL HIGH (ref 70–99)
Glucose-Capillary: 137 mg/dL — ABNORMAL HIGH (ref 70–99)
Glucose-Capillary: 142 mg/dL — ABNORMAL HIGH (ref 70–99)
Glucose-Capillary: 143 mg/dL — ABNORMAL HIGH (ref 70–99)
Glucose-Capillary: 149 mg/dL — ABNORMAL HIGH (ref 70–99)
Glucose-Capillary: 159 mg/dL — ABNORMAL HIGH (ref 70–99)
Glucose-Capillary: 196 mg/dL — ABNORMAL HIGH (ref 70–99)
Glucose-Capillary: 225 mg/dL — ABNORMAL HIGH (ref 70–99)
Glucose-Capillary: 229 mg/dL — ABNORMAL HIGH (ref 70–99)
Glucose-Capillary: 73 mg/dL (ref 70–99)

## 2021-06-26 LAB — TYPE AND SCREEN
ABO/RH(D): A POS
Antibody Screen: NEGATIVE
Unit division: 0
Unit division: 0

## 2021-06-26 LAB — PREPARE FRESH FROZEN PLASMA: Unit division: 0

## 2021-06-26 LAB — POCT I-STAT 7, (LYTES, BLD GAS, ICA,H+H)
Acid-base deficit: 3 mmol/L — ABNORMAL HIGH (ref 0.0–2.0)
Bicarbonate: 22 mmol/L (ref 20.0–28.0)
Calcium, Ion: 1.14 mmol/L — ABNORMAL LOW (ref 1.15–1.40)
HCT: 27 % — ABNORMAL LOW (ref 39.0–52.0)
Hemoglobin: 9.2 g/dL — ABNORMAL LOW (ref 13.0–17.0)
O2 Saturation: 98 %
Patient temperature: 37.4
Potassium: 3.9 mmol/L (ref 3.5–5.1)
Sodium: 137 mmol/L (ref 135–145)
TCO2: 23 mmol/L (ref 22–32)
pCO2 arterial: 36.8 mmHg (ref 32.0–48.0)
pH, Arterial: 7.386 (ref 7.350–7.450)
pO2, Arterial: 105 mmHg (ref 83.0–108.0)

## 2021-06-26 LAB — MAGNESIUM
Magnesium: 2.6 mg/dL — ABNORMAL HIGH (ref 1.7–2.4)
Magnesium: 2.3 mg/dL (ref 1.7–2.4)

## 2021-06-26 MED ORDER — INSULIN ASPART 100 UNIT/ML IJ SOLN: 0.0000 [IU] | INTRAMUSCULAR | Status: DC

## 2021-06-26 MED ORDER — SODIUM CHLORIDE 0.9% FLUSH
10.0000 mL | Freq: Two times a day (BID) | INTRAVENOUS | Status: DC
  Administered 2021-06-26 – 2021-07-01 (×8): 10 mL

## 2021-06-26 MED ORDER — INSULIN ASPART 100 UNIT/ML IJ SOLN
0.0000 [IU] | INTRAMUSCULAR | Status: DC
  Administered 2021-06-26: 4 [IU] via SUBCUTANEOUS
  Administered 2021-06-26: 2 [IU] via SUBCUTANEOUS
  Administered 2021-06-26 – 2021-06-27 (×2): 8 [IU] via SUBCUTANEOUS

## 2021-06-26 MED ORDER — SODIUM CHLORIDE 0.9% FLUSH: 10.0000 mL | INTRAVENOUS | Status: DC | PRN

## 2021-06-26 MED FILL — Electrolyte-R (PH 7.4) Solution: INTRAVENOUS | Qty: 7000 | Status: AC

## 2021-06-26 MED FILL — Mannitol IV Soln 20%: INTRAVENOUS | Qty: 500 | Status: AC

## 2021-06-26 MED FILL — Potassium Chloride Inj 2 mEq/ML: INTRAVENOUS | Qty: 40 | Status: AC

## 2021-06-26 MED FILL — Magnesium Sulfate Inj 50%: INTRAMUSCULAR | Qty: 10 | Status: AC

## 2021-06-26 MED FILL — Sodium Bicarbonate IV Soln 8.4%: INTRAVENOUS | Qty: 50 | Status: AC

## 2021-06-26 MED FILL — Calcium Chloride Inj 10%: INTRAVENOUS | Qty: 10 | Status: AC

## 2021-06-26 MED FILL — Heparin Sodium (Porcine) Inj 1000 Unit/ML: INTRAMUSCULAR | Qty: 10 | Status: AC

## 2021-06-26 MED FILL — Albumin, Human Inj 5%: INTRAVENOUS | Qty: 250 | Status: AC

## 2021-06-26 MED FILL — Lidocaine HCl (Cardiac) IV PF Soln 100 MG/5ML (2%): INTRAVENOUS | Qty: 5 | Status: AC

## 2021-06-26 MED FILL — Sodium Chloride IV Soln 0.9%: INTRAVENOUS | Qty: 4000 | Status: AC

## 2021-06-26 MED FILL — Heparin Sodium (Porcine) Inj 1000 Unit/ML: Qty: 1000 | Status: AC

## 2021-06-26 NOTE — Progress Notes (Signed)
EVENING ROUNDS NOTE :     [address]       [place] [address]             [phone]                 1 Day Post-Op Procedure(s) (LRB): CORONARY ARTERY BYPASS GRAFTING (CABG) X4 USING LEFT INTERNAL MAMMARY ARTERY AND ENDOSCOPICALLY HARVESTED RIGHT GREATER SAPHENOUS VEIN (N/A) ENDOVEIN HARVEST OF GREATER SAPHENOUS VEIN (Right)   Total Length of Stay:  LOS: 1 day  Events:   No events Up to chair Remains paced    BP (!) 142/88    Pulse 82    Temp 98 F (36.7 C) (Oral)    Resp (!) 28    Ht 5\' 6"  (1.676 m)    Wt 70 kg    SpO2 95%    BMI 24.91 kg/m   PAP: (13-33)/(8-22) 17/8 CO:  [4.3 L/min-5.3 L/min] 5.3 L/min CI:  [2.4 L/min/m2-3 L/min/m2] 3 L/min/m2  Vent Mode: PSV;CPAP FiO2 (%):  [40 %] 40 % Set Rate:  [4 bmp] 4 bmp PEEP:  [5 cmH20] 5 cmH20 Pressure Support:  [10 cmH20] 10 cmH20   sodium chloride     sodium chloride     sodium chloride 10 mL/hr at 06/25/21 1500    ceFAZolin (ANCEF) IV Stopped (06/26/21 1408)   insulin Stopped (06/26/21 0909)   lactated ringers Stopped (06/25/21 1546)   lactated ringers Stopped (06/26/21 0929)   nitroGLYCERIN Stopped (06/25/21 1450)   phenylephrine (NEO-SYNEPHRINE) Adult infusion 10 mcg/min (06/25/21 2100)    I/O last 3 completed shifts: In: 4974.1 [I.V.:2820.5; Blood:1077; IV Piggyback:1076.6] Out: 5956 [Urine:3315; Blood:1124; Chest Tube:660]   CBC Latest Ref Rng & Units 06/26/2021 06/26/2021 06/26/2021  WBC 4.0 - 10.5 K/uL 12.2(H) - 9.6  Hemoglobin 13.0 - 17.0 g/dL 11.3(L) 9.2(L) 10.5(L)  Hematocrit 39.0 - 52.0 % 31.6(L) 27.0(L) 29.3(L)  Platelets 150 - 400 K/uL PENDING - 98(L)    BMP Latest Ref Rng & Units 06/26/2021 06/26/2021 06/26/2021  Glucose 70 - 99 mg/dL 217(H) - 126(H)  BUN 8 - 23 mg/dL 16 - 12  Creatinine 0.61 - 1.24 mg/dL 1.01 - 1.01  BUN/Creat Ratio 10 - 24 - - -  Sodium 135 - 145 mmol/L 130(L) 137 133(L)  Potassium 3.5 - 5.1 mmol/L 4.1 3.9 4.1  Chloride 98 - 111 mmol/L 98 - 105  CO2 22 - 32  mmol/L 23 - 21(L)  Calcium 8.9 - 10.3 mg/dL 8.2(L) - 7.6(L)    ABG    Component Value Date/Time   PHART 7.386 06/26/2021 0806   PCO2ART 36.8 06/26/2021 0806   PO2ART 105 06/26/2021 0806   HCO3 22.0 06/26/2021 0806   TCO2 23 06/26/2021 0806   ACIDBASEDEF 3.0 (H) 06/26/2021 0806   O2SAT 98.0 06/26/2021 0806       Melodie Bouillon, MD 06/26/2021 5:40 PM

## 2021-06-26 NOTE — Progress Notes (Signed)
1 Day Post-Op Procedure(s) (LRB): CORONARY ARTERY BYPASS GRAFTING (CABG) X4 USING LEFT INTERNAL MAMMARY ARTERY AND ENDOSCOPICALLY HARVESTED RIGHT GREATER SAPHENOUS VEIN (N/A) ENDOVEIN HARVEST OF GREATER SAPHENOUS VEIN (Right) Subjective: Stable night after CABG Wants to get up OOB A-paced, stable hemodynamics off drips DM well controlled  Objective: Vital signs in last 24 hours: Temp:  [95.5 F (35.3 C)-99.1 F (37.3 C)] 98.8 F (37.1 C) (01/11 0800) Pulse Rate:  [68-86] 80 (01/11 0800) Resp:  [0-27] 22 (01/11 0800) BP: (83-125)/(48-75) 110/66 (01/11 0800) SpO2:  [84 %-99 %] 96 % (01/11 0800) Arterial Line BP: (85-145)/(45-77) 130/54 (01/11 0800) FiO2 (%):  [40 %-50 %] 40 % (01/10 1808) Weight:  [70 kg] 70 kg (01/11 0500)  Hemodynamic parameters for last 24 hours: PAP: (13-33)/(9-22) 18/9 CO:  [2.8 L/min-5.3 L/min] 5.3 L/min CI:  [1.6 L/min/m2-3 L/min/m2] 3 L/min/m2  Intake/Output from previous day: 01/10 0701 - 01/11 0700 In: 4974.1 [I.V.:2820.5; Blood:1077; IV Piggyback:1076.6] Out: 5099 [Urine:3315; Blood:1124; Chest Tube:660] Intake/Output this shift: Total I/O In: 21 [I.V.:21] Out: 125 [Urine:75; Chest Tube:50]      Exam    General- alert and comfortable    Neck- no JVD, no cervical adenopathy palpable, no carotid bruit   Lungs- clear without rales, wheezes- no air leak from chest tubes   Cor- regular rate and rhythm, no murmur , gallop   Abdomen- soft, non-tender   Extremities - warm, non-tender, minimal edema   Neuro- oriented, appropriate, no focal weakness   Lab Results: Recent Labs    06/25/21 2106 06/26/21 0410 06/26/21 0806  WBC 8.5 9.6  --   HGB 10.6* 10.5* 9.2*  HCT 29.6* 29.3* 27.0*  PLT 82* 98*  --    BMET:  Recent Labs    06/25/21 2106 06/26/21 0410 06/26/21 0806  NA 132* 133* 137  K 4.2 4.1 3.9  CL 104 105  --   CO2 21* 21*  --   GLUCOSE 118* 126*  --   BUN 12 12  --   CREATININE 0.93 1.01  --   CALCIUM 7.4* 7.6*  --      PT/INR:  Recent Labs    06/25/21 1506  LABPROT 18.6*  INR 1.6*   ABG    Component Value Date/Time   PHART 7.386 06/26/2021 0806   HCO3 22.0 06/26/2021 0806   TCO2 23 06/26/2021 0806   ACIDBASEDEF 3.0 (H) 06/26/2021 0806   O2SAT 98.0 06/26/2021 0806   CBG (last 3)  Recent Labs    06/26/21 0558 06/26/21 0703 06/26/21 0803  GLUCAP 142* 149* 134*    Assessment/Plan: S/P Procedure(s) (LRB): CORONARY ARTERY BYPASS GRAFTING (CABG) X4 USING LEFT INTERNAL MAMMARY ARTERY AND ENDOSCOPICALLY HARVESTED RIGHT GREATER SAPHENOUS VEIN (N/A) ENDOVEIN HARVEST OF GREATER SAPHENOUS VEIN (Right) Mobilize Diuresis Diabetes control d/c tubes/lines See progression orders   LOS: 1 day    [name] 06/26/2021

## 2021-06-26 NOTE — Anesthesia Postprocedure Evaluation (Signed)
Anesthesia Post Note  Patient: Sean Kirby  Procedure(s) Performed: CORONARY ARTERY BYPASS GRAFTING (CABG) X4 USING LEFT INTERNAL MAMMARY ARTERY AND ENDOSCOPICALLY HARVESTED RIGHT GREATER SAPHENOUS VEIN (Chest) ENDOVEIN HARVEST OF GREATER SAPHENOUS VEIN (Right: Leg Lower)     Patient location during evaluation: SICU Anesthesia Type: General Level of consciousness: sedated Pain management: pain level controlled Vital Signs Assessment: post-procedure vital signs reviewed and stable Respiratory status: patient remains intubated per anesthesia plan Cardiovascular status: stable Postop Assessment: no apparent nausea or vomiting Anesthetic complications: no   No notable events documented.  Last Vitals:  Vitals:   06/26/21 1100 06/26/21 1130  BP: 110/71 111/63  Pulse: 80 80  Resp: 10 18  Temp: 37.3 C 37.3 C  SpO2: 98% 94%    Last Pain:  Vitals:   06/26/21 0800  TempSrc:   PainSc: 0-No pain                 Shalin Vonbargen S

## 2021-06-27 ENCOUNTER — Inpatient Hospital Stay (HOSPITAL_COMMUNITY): Payer: Medicare Other

## 2021-06-27 LAB — BASIC METABOLIC PANEL
Anion gap: 4 — ABNORMAL LOW (ref 5–15)
BUN: 15 mg/dL (ref 8–23)
CO2: 24 mmol/L (ref 22–32)
Calcium: 7.9 mg/dL — ABNORMAL LOW (ref 8.9–10.3)
Chloride: 101 mmol/L (ref 98–111)
Creatinine, Ser: 0.96 mg/dL (ref 0.61–1.24)
GFR, Estimated: >60 mL/min (ref >60)
Glucose, Bld: 86 mg/dL (ref 70–99)
Potassium: 3.6 mmol/L (ref 3.5–5.1)
Sodium: 129 mmol/L — ABNORMAL LOW (ref 135–145)

## 2021-06-27 LAB — CBC
HCT: 30.2 % — ABNORMAL LOW (ref 39.0–52.0)
Hemoglobin: 10.5 g/dL — ABNORMAL LOW (ref 13.0–17.0)
MCH: 32 pg (ref 26.0–34.0)
MCHC: 34.8 g/dL (ref 30.0–36.0)
MCV: 92.1 fL (ref 80.0–100.0)
Platelets: 98 10*3/uL — ABNORMAL LOW (ref 150–400)
RBC: 3.28 MIL/uL — ABNORMAL LOW (ref 4.22–5.81)
RDW: 12.7 % (ref 11.5–15.5)
WBC: 12.2 10*3/uL — ABNORMAL HIGH (ref 4.0–10.5)
nRBC: 0 % (ref 0.0–0.2)

## 2021-06-27 LAB — GLUCOSE, CAPILLARY
Glucose-Capillary: 88 mg/dL (ref 70–99)
Glucose-Capillary: 240 mg/dL — ABNORMAL HIGH (ref 70–99)
Glucose-Capillary: 130 mg/dL — ABNORMAL HIGH (ref 70–99)
Glucose-Capillary: 99 mg/dL (ref 70–99)
Glucose-Capillary: 179 mg/dL — ABNORMAL HIGH (ref 70–99)

## 2021-06-27 MED ORDER — SODIUM CHLORIDE 0.9% FLUSH: 3.0000 mL | INTRAVENOUS | Status: DC | PRN

## 2021-06-27 MED ORDER — POLYETHYLENE GLYCOL 3350 17 G PO PACK: 17.0000 g | PACK | Freq: Once | ORAL | Status: DC

## 2021-06-27 MED ORDER — ORAL CARE MOUTH RINSE
15.0000 mL | Freq: Two times a day (BID) | OROMUCOSAL | Status: DC
  Administered 2021-06-27 – 2021-07-02 (×11): 15 mL via OROMUCOSAL

## 2021-06-27 MED ORDER — MAGNESIUM HYDROXIDE 400 MG/5ML PO SUSP: 30.0000 mL | Freq: Every day | ORAL | Status: DC | PRN

## 2021-06-27 MED ORDER — INSULIN DETEMIR 100 UNIT/ML ~~LOC~~ SOLN
10.0000 [IU] | Freq: Two times a day (BID) | SUBCUTANEOUS | Status: DC
  Administered 2021-06-27 – 2021-06-28 (×4): 10 [IU] via SUBCUTANEOUS
  Filled 2021-06-27 (×7): qty 0.1

## 2021-06-27 MED ORDER — PHENYLEPHRINE HCL-NACL 20-0.9 MG/250ML-% IV SOLN
INTRAVENOUS | Status: AC
  Filled 2021-06-27: qty 750

## 2021-06-27 MED ORDER — SODIUM CHLORIDE 0.9 % IV SOLN: 250.0000 mL | INTRAVENOUS | Status: DC | PRN

## 2021-06-27 MED ORDER — SODIUM CHLORIDE 0.9% FLUSH
3.0000 mL | Freq: Two times a day (BID) | INTRAVENOUS | Status: DC
  Administered 2021-06-27 – 2021-07-01 (×7): 3 mL via INTRAVENOUS

## 2021-06-27 MED ORDER — POTASSIUM CHLORIDE CRYS ER 20 MEQ PO TBCR
20.0000 meq | EXTENDED_RELEASE_TABLET | Freq: Two times a day (BID) | ORAL | Status: DC
  Administered 2021-06-27 – 2021-06-28 (×4): 20 meq via ORAL
  Filled 2021-06-27 (×4): qty 1

## 2021-06-27 MED ORDER — INSULIN ASPART 100 UNIT/ML IJ SOLN
0.0000 [IU] | Freq: Three times a day (TID) | INTRAMUSCULAR | Status: DC
  Administered 2021-06-27: 4 [IU] via SUBCUTANEOUS
  Administered 2021-06-27 – 2021-06-28 (×3): 2 [IU] via SUBCUTANEOUS
  Administered 2021-06-29 – 2021-07-01 (×2): 4 [IU] via SUBCUTANEOUS
  Administered 2021-07-01: 2 [IU] via SUBCUTANEOUS

## 2021-06-27 MED ORDER — ~~LOC~~ CARDIAC SURGERY, PATIENT & FAMILY EDUCATION: Freq: Once | Status: AC

## 2021-06-27 MED ORDER — MORPHINE SULFATE (PF) 2 MG/ML IV SOLN: 1.0000 mg | INTRAVENOUS | Status: DC | PRN

## 2021-06-27 MED ORDER — FUROSEMIDE 40 MG PO TABS
40.0000 mg | ORAL_TABLET | Freq: Every day | ORAL | Status: DC
  Administered 2021-06-27: 40 mg via ORAL
  Filled 2021-06-27: qty 1

## 2021-06-27 MED ORDER — TIMOLOL MALEATE 0.5 % OP SOLN
1.0000 [drp] | Freq: Every day | OPHTHALMIC | Status: DC
  Administered 2021-06-27 – 2021-07-01 (×5): 1 [drp] via OPHTHALMIC
  Filled 2021-06-27 (×2): qty 5

## 2021-06-27 NOTE — Plan of Care (Signed)
  Problem: Education: Goal: Knowledge of disease or condition will improve Outcome: Progressing Goal: Knowledge of the prescribed therapeutic regimen will improve Outcome: Progressing   Problem: Activity: Goal: Risk for activity intolerance will decrease Outcome: Progressing   Problem: Cardiac: Goal: Will achieve and/or maintain hemodynamic stability Outcome: Progressing   Problem: Clinical Measurements: Goal: Postoperative complications will be avoided or minimized Outcome: Progressing   

## 2021-06-27 NOTE — Progress Notes (Signed)
0700 - Report received from PM RN.  All questions answered.  Safety checks performed.  All lines verified. Hand hygiene performed before/after each pt contact. [place], PA rounded.  Pt placed TPM on backup rate at VVI 60 and 10 mA.  Pt assisted to bathroom with Yvone Neu, RN and four wheel walker without difficulty or distress.  CT output with ambulation 50 mL. 0800 - Assessment 0900 - Dr. Lawson Fiscal rounded. 1015 - Morning rounds. 1100 - Pt's sister arrived to visit. 1200 - Foley D/C'd, pt bathed, pt walked 370' around the unit without difficulty or distress, pt returned to bed, and Introducer D/C'd. 1400 - Report given to [place], 4E RN.  All questions answered. 1445 - SWOT transferred pt to 4E.

## 2021-06-27 NOTE — Progress Notes (Addendum)
TCTS DAILY ICU PROGRESS NOTE                   [address]            [place] [address]          [phone]   2 Days Post-Op Procedure(s) (LRB): CORONARY ARTERY BYPASS GRAFTING (CABG) X4 USING LEFT INTERNAL MAMMARY ARTERY AND ENDOSCOPICALLY HARVESTED RIGHT GREATER SAPHENOUS VEIN (N/A) ENDOVEIN HARVEST OF GREATER SAPHENOUS VEIN (Right)  Total Length of Stay:  LOS: 2 days   Subjective: Up in the bedside chair, awake and alert. No new concerns.  Walked a full lap in the unit this morning.   Objective: Vital signs in last 24 hours: Temp:  [98 F (36.7 C)-99.3 F (37.4 C)] 98.6 F (37 C) (01/12 0400) Pulse Rate:  [76-83] 82 (01/12 0700) Cardiac Rhythm: Atrial paced (01/12 0400) Resp:  [10-28] 21 (01/12 0700) BP: (96-142)/(63-88) 128/72 (01/12 0700) SpO2:  [92 %-99 %] 94 % (01/12 0700) Arterial Line BP: (128-148)/(50-62) 139/50 (01/11 1100) Weight:  [75.4 kg] 75.4 kg (01/12 0600)  Filed Weights   06/25/21 0555 06/26/21 0500 06/27/21 0600  Weight: 70.8 kg 70 kg 75.4 kg    Weight change: 5.4 kg   Hemodynamic parameters for last 24 hours: PAP: (17)/(8) 17/8  Intake/Output from previous day: 01/11 0701 - 01/12 0700 In: 563.2 [P.O.:220; I.V.:51.7; IV Piggyback:291.4] Out: 1735 [Urine:1095; Chest Tube:640]  Intake/Output this shift: No intake/output data recorded.  Current Meds: Scheduled Meds:  sodium chloride   Intravenous Once   acetaminophen  1,000 mg Oral Q6H   Or   acetaminophen (TYLENOL) oral liquid 160 mg/5 mL  1,000 mg Per Tube Q6H   aspirin EC  325 mg Oral Daily   Or   aspirin  324 mg Per Tube Daily   bisacodyl  10 mg Oral Daily   Or   bisacodyl  10 mg Rectal Daily   Chlorhexidine Gluconate Cloth  6 each Topical Daily   docusate sodium  200 mg Oral Daily   insulin aspart  0-24 Units Subcutaneous Q4H   latanoprost  1 drop Both Eyes QHS   mouth rinse  15 mL Mouth Rinse BID   metoprolol tartrate  12.5 mg Oral BID   Or   metoprolol  tartrate  12.5 mg Per Tube BID   pantoprazole  40 mg Oral Daily   rosuvastatin  20 mg Oral Daily   sodium chloride flush  10-40 mL Intracatheter Q12H   sodium chloride flush  3 mL Intravenous Q12H   Continuous Infusions:  sodium chloride     sodium chloride     sodium chloride 10 mL/hr at 06/25/21 1500   insulin Stopped (06/26/21 0909)   lactated ringers Stopped (06/25/21 1546)   lactated ringers Stopped (06/26/21 0929)   nitroGLYCERIN Stopped (06/25/21 1450)   phenylephrine (NEO-SYNEPHRINE) Adult infusion 10 mcg/min (06/25/21 2100)   PRN Meds:.sodium chloride, dextrose, metoprolol tartrate, midazolam, morphine injection, ondansetron (ZOFRAN) IV, oxyCODONE, sodium chloride flush, sodium chloride flush, sodium chloride flush, traMADol  General appearance: alert, cooperative, and no distress Neurologic: intact Heart: A-paced at 80/min past 24 hours. Has intrinsic SR in the  Lungs: breath sounds clear, diminished. CXR showing increase in patchy opacities c/w ATX . Has small bilat effusions. CT drainage 334ml for past 12 hours. Abdomen: soft, NT. Extremities: mild LE edema. All exts well perfused. RLE EVH incision is dry. Wound: the sternotomy incision is covered with a dry Aquacel dressing.  Lab Results: CBC: Recent Labs    06/26/21 1649 06/27/21 0335  WBC 12.2* 12.2*  HGB 11.3* 10.5*  HCT 31.6* 30.2*  PLT 111* 98*   BMET:  Recent Labs    06/26/21 1649 06/27/21 0335  NA 130* 129*  K 4.1 3.6  CL 98 101  CO2 23 24  GLUCOSE 217* 86  BUN 16 15  CREATININE 1.01 0.96  CALCIUM 8.2* 7.9*    CMET: Lab Results  Component Value Date   WBC 12.2 (H) 06/27/2021   HGB 10.5 (L) 06/27/2021   HCT 30.2 (L) 06/27/2021   PLT 98 (L) 06/27/2021   GLUCOSE 86 06/27/2021   ALT 21 06/21/2021   AST 24 06/21/2021   NA 129 (L) 06/27/2021   K 3.6 06/27/2021   CL 101 06/27/2021   CREATININE 0.96 06/27/2021   BUN 15 06/27/2021   CO2 24 06/27/2021   TSH 2.205 05/07/2021   INR 1.6 (H)  06/25/2021   HGBA1C 6.9 (H) 06/21/2021      PT/INR:  Recent Labs    06/25/21 1506  LABPROT 18.6*  INR 1.6*   Radiology: DG Chest Port 1 View  Result Date: 06/27/2021 CLINICAL DATA:  Status post CABG EXAM: PORTABLE CHEST 1 VIEW COMPARISON:  Chest x-ray dated June 26, 2021 FINDINGS: Cardiac and mediastinal contours are within normal limits status post median sternotomy and CABG. Interval removal of PA catheter with right IJ line sheath in place. Bilateral chest tubes. Increased mild bilateral heterogeneous opacities, likely due to atelectasis or pulmonary edema. Probable trace bilateral pleural effusions. No evidence of pneumothorax. IMPRESSION: 1. Increased mild bilateral heterogeneous opacities, likely due to atelectasis or pulmonary edema. 2. Interval removal of PA catheter with right IJ line sheath in place. Electronically Signed   By: Yetta Glassman M.D.   On: 06/27/2021 07:52     Assessment/Plan: S/P Procedure(s) (LRB): CORONARY ARTERY BYPASS GRAFTING (CABG) X4 USING LEFT INTERNAL MAMMARY ARTERY AND ENDOSCOPICALLY HARVESTED RIGHT GREATER SAPHENOUS VEIN (N/A) ENDOVEIN HARVEST OF GREATER SAPHENOUS VEIN (Right)  --POD2 Elective CABG x 4 for MVCAD presenting with Class III angina and preserved EF. VS stable. On ASA, metoprolol, and Crestor.  In stable SR, pacer switched to VVI at 60 this morning.  Progressing with mobility.  D/C the Foley catheter and IJ sleeve.  -ENDO- H/O well controlled type 2 DM. Glucose 229-240 past 12 hours. Add Levemir 10 uints BID and continue SSI.   -HEME- mild expected acute blood loss anemia- minimal ongoing losses. Monitor.   -GI- no bowel BM or flatus yet.  Large amt of gas in descending colon on CXR. Advance diet slowly as tolerated   -RENAL- normal function. Wt 5kg + if accurate. Gently diurese, watch Na+, replace K+.  -PULM- mild hypoxia but maintaining sats on 5L/[place]. Multiple segments of ATX on CXR.  Encouraging pulmonary hygiene.     Antony Odea, PA-C [phone] 06/27/2021 8:00 AM  Progressing well, now off a-pacing and O2 decreased to 3L/min Leave chest tubes for no Tx to 4E Patient lived alone and may need SNF- will evaluate   patient examined and medical record reviewed,agree with above note. [name] 06/27/2021

## 2021-06-27 NOTE — Plan of Care (Signed)
°  Problem: Education: Goal: Will demonstrate proper wound care and an understanding of methods to prevent future damage Outcome: Progressing Goal: Knowledge of disease or condition will improve Outcome: Progressing Goal: Knowledge of the prescribed therapeutic regimen will improve Outcome: Progressing Goal: Individualized Educational Video(s) Outcome: Progressing   Problem: Activity: Goal: Risk for activity intolerance will decrease Outcome: Progressing   Problem: Cardiac: Goal: Will achieve and/or maintain hemodynamic stability Outcome: Progressing   Problem: Clinical Measurements: Goal: Postoperative complications will be avoided or minimized Outcome: Progressing   Problem: Respiratory: Goal: Respiratory status will improve Outcome: Progressing   Problem: Skin Integrity: Goal: Wound healing without signs and symptoms of infection Outcome: Progressing Goal: Risk for impaired skin integrity will decrease Outcome: Progressing     

## 2021-06-27 NOTE — Progress Notes (Signed)
Patient arrived to 4E from 2 Heart via SWOT RN. Vitals signs taken and stable. Tele placed and CCMD notified. Chest tube and pacer wires assessed. Dressing in place midsternal. Patient's family at bedside. Patient oriented to room and staff. Call bell within reach.  [place] N Mckenzee Beem

## 2021-06-28 ENCOUNTER — Inpatient Hospital Stay (HOSPITAL_COMMUNITY): Payer: Medicare Other

## 2021-06-28 LAB — TSH: TSH: 1.159 u[IU]/mL (ref 0.350–4.500)

## 2021-06-28 LAB — CBC
HCT: 34 % — ABNORMAL LOW (ref 39.0–52.0)
Hemoglobin: 11.7 g/dL — ABNORMAL LOW (ref 13.0–17.0)
MCH: 31.2 pg (ref 26.0–34.0)
MCHC: 34.4 g/dL (ref 30.0–36.0)
MCV: 90.7 fL (ref 80.0–100.0)
Platelets: 133 10*3/uL — ABNORMAL LOW (ref 150–400)
RBC: 3.75 MIL/uL — ABNORMAL LOW (ref 4.22–5.81)
RDW: 12.3 % (ref 11.5–15.5)
WBC: 14.6 10*3/uL — ABNORMAL HIGH (ref 4.0–10.5)
nRBC: 0 % (ref 0.0–0.2)

## 2021-06-28 LAB — GLUCOSE, CAPILLARY
Glucose-Capillary: 125 mg/dL — ABNORMAL HIGH (ref 70–99)
Glucose-Capillary: 112 mg/dL — ABNORMAL HIGH (ref 70–99)
Glucose-Capillary: 140 mg/dL — ABNORMAL HIGH (ref 70–99)
Glucose-Capillary: 146 mg/dL — ABNORMAL HIGH (ref 70–99)
Glucose-Capillary: 104 mg/dL — ABNORMAL HIGH (ref 70–99)

## 2021-06-28 LAB — BASIC METABOLIC PANEL
Anion gap: 4 — ABNORMAL LOW (ref 5–15)
BUN: 13 mg/dL (ref 8–23)
CO2: 25 mmol/L (ref 22–32)
Calcium: 8.1 mg/dL — ABNORMAL LOW (ref 8.9–10.3)
Chloride: 101 mmol/L (ref 98–111)
Creatinine, Ser: 0.91 mg/dL (ref 0.61–1.24)
GFR, Estimated: >60 mL/min (ref >60)
Glucose, Bld: 97 mg/dL (ref 70–99)
Potassium: 4.3 mmol/L (ref 3.5–5.1)
Sodium: 130 mmol/L — ABNORMAL LOW (ref 135–145)

## 2021-06-28 LAB — HEPATIC FUNCTION PANEL
ALT: 18 U/L (ref 0–44)
AST: 52 U/L — ABNORMAL HIGH (ref 15–41)
Albumin: 3 g/dL — ABNORMAL LOW (ref 3.5–5.0)
Alkaline Phosphatase: 57 U/L (ref 38–126)
Bilirubin, Direct: 0.2 mg/dL (ref 0.0–0.2)
Indirect Bilirubin: 0.7 mg/dL (ref 0.3–0.9)
Total Bilirubin: 0.9 mg/dL (ref 0.3–1.2)
Total Protein: 5.3 g/dL — ABNORMAL LOW (ref 6.5–8.1)

## 2021-06-28 MED ORDER — AMIODARONE LOAD VIA INFUSION
150.0000 mg | Freq: Once | INTRAVENOUS | Status: AC
  Administered 2021-06-28: 150 mg via INTRAVENOUS
  Filled 2021-06-28: qty 83.34

## 2021-06-28 MED ORDER — FUROSEMIDE 10 MG/ML IJ SOLN
20.0000 mg | Freq: Two times a day (BID) | INTRAMUSCULAR | Status: DC
  Administered 2021-06-28 – 2021-06-30 (×6): 20 mg via INTRAVENOUS
  Filled 2021-06-28 (×6): qty 2

## 2021-06-28 MED ORDER — AMIODARONE HCL IN DEXTROSE 360-4.14 MG/200ML-% IV SOLN
60.0000 mg/h | INTRAVENOUS | Status: AC
  Administered 2021-06-28 (×2): 60 mg/h via INTRAVENOUS
  Filled 2021-06-28: qty 200

## 2021-06-28 MED ORDER — AMIODARONE HCL IN DEXTROSE 360-4.14 MG/200ML-% IV SOLN
30.0000 mg/h | INTRAVENOUS | Status: DC
  Administered 2021-06-28 – 2021-06-29 (×2): 30 mg/h via INTRAVENOUS
  Filled 2021-06-28 (×2): qty 200

## 2021-06-28 MED ORDER — MORPHINE SULFATE (PF) 2 MG/ML IV SOLN: 2.0000 mg | INTRAVENOUS | Status: DC | PRN

## 2021-06-28 MED ORDER — METOPROLOL TARTRATE 25 MG PO TABS
25.0000 mg | ORAL_TABLET | Freq: Two times a day (BID) | ORAL | Status: DC
  Administered 2021-06-28 – 2021-07-02 (×9): 25 mg via ORAL
  Filled 2021-06-28 (×9): qty 1

## 2021-06-28 MED ORDER — METOPROLOL TARTRATE 25 MG/10 ML ORAL SUSPENSION: 12.5000 mg | Freq: Two times a day (BID) | ORAL | Status: DC

## 2021-06-28 MED ORDER — BISACODYL 10 MG RE SUPP: 10.0000 mg | Freq: Every day | RECTAL | Status: DC | PRN

## 2021-06-28 MED ORDER — BISACODYL 5 MG PO TBEC: 10.0000 mg | DELAYED_RELEASE_TABLET | Freq: Every day | ORAL | Status: DC | PRN

## 2021-06-28 MED ORDER — DOCUSATE SODIUM 100 MG PO CAPS: 200.0000 mg | ORAL_CAPSULE | Freq: Every day | ORAL | Status: DC | PRN

## 2021-06-28 NOTE — Progress Notes (Signed)
Mobility Specialist: Progress Note   06/28/21 1622  Mobility  Activity Contraindicated/medical hold   Instructed by RN to hold off on seeing pt d/t elevated HR. Will f/u as able.  [facility] Njeri Vicente Mobility Specialist Mobility Specialist 4 [place]: [phone] Mobility Specialist 2 [place] and [place]: [phone]

## 2021-06-28 NOTE — Progress Notes (Addendum)
° °   °  [address]       [place] [address]             [phone]      13:26  Called to see patient regarding sustained tachycardia.    Sean Kirby is awake and alert, denies pain.   Monitor showing a regular supraventricular tachycardia in the 120's -135 and stable BP in the 110's.  EKG confirmed atrial flutter with 2:1 conduction.   CT drainage only 5ml in the past 6 hours.  Plan:  Will initiate amiodarone IV with standard 150mg  bolus and infusion. Check baseline TSH and hepatic panel. Re-check Mg++ and K+ in am.  OK to remove the chest tubes this afternoon.   Macarthur Critchley, PA-C [phone]  patient examined and medical record reviewed,agree with above note. [name] 06/28/2021

## 2021-06-28 NOTE — Progress Notes (Signed)
CT Surgery   Patient developed a-flutter HR 110-120 IV amiodarone started Rapid a-paced back to nsr Patient having some confusion and impulsive behavior Will monitor over the weekend a to assess need for SNF if he would be unsafe at home as he lives alone Will DC chest tubes and stop narcotocs  P Prescott Gum MD

## 2021-06-28 NOTE — Progress Notes (Addendum)
TCTS DAILY ICU PROGRESS NOTE                   [address]            [place], [address]          [phone]   3 Days Post-Op Procedure(s) (LRB): CORONARY ARTERY BYPASS GRAFTING (CABG) X4 USING LEFT INTERNAL MAMMARY ARTERY AND ENDOSCOPICALLY HARVESTED RIGHT GREATER SAPHENOUS VEIN (N/A) ENDOVEIN HARVEST OF GREATER SAPHENOUS VEIN (Right)  Total Length of Stay:  LOS: 3 days   Subjective: Up in the bedside chair, awake and alert. Had several loose stools yesterday and last night.   Objective: Vital signs in last 24 hours: Temp:  [97.5 F (36.4 C)-98.4 F (36.9 C)] 97.5 F (36.4 C) (01/12 2300) Pulse Rate:  [69-82] 80 (01/12 2300) Cardiac Rhythm: Normal sinus rhythm (01/12 1901) Resp:  [19-27] 20 (01/12 2300) BP: (114-148)/(69-84) 131/84 (01/12 2300) SpO2:  [92 %-98 %] 96 % (01/12 2300)  Filed Weights   06/25/21 0555 06/26/21 0500 06/27/21 0600  Weight: 70.8 kg 70 kg 75.4 kg    Weight change:    Hemodynamic parameters for last 24 hours:    Intake/Output from previous day: 01/12 0701 - 01/13 0700 In: 1195.8 [P.O.:1080; I.V.:115.8] Out: 1890 [Urine:1335; Chest Tube:555]  Intake/Output this shift: No intake/output data recorded.  Current Meds: Scheduled Meds:  sodium chloride   Intravenous Once   acetaminophen  1,000 mg Oral Q6H   Or   acetaminophen (TYLENOL) oral liquid 160 mg/5 mL  1,000 mg Per Tube Q6H   aspirin EC  325 mg Oral Daily   Or   aspirin  324 mg Per Tube Daily   bisacodyl  10 mg Oral Daily   Or   bisacodyl  10 mg Rectal Daily   Chlorhexidine Gluconate Cloth  6 each Topical Daily   docusate sodium  200 mg Oral Daily   furosemide  20 mg Intravenous BID   insulin aspart  0-24 Units Subcutaneous TID WC & HS   insulin detemir  10 Units Subcutaneous BID   latanoprost  1 drop Both Eyes QHS   mouth rinse  15 mL Mouth Rinse BID   metoprolol tartrate  25 mg Oral BID   pantoprazole  40 mg Oral Daily   polyethylene glycol  17 g Oral  Once   potassium chloride  20 mEq Oral BID   rosuvastatin  20 mg Oral Daily   sodium chloride flush  10-40 mL Intracatheter Q12H   sodium chloride flush  3 mL Intravenous Q12H   sodium chloride flush  3 mL Intravenous Q12H   timolol  1 drop Both Eyes Daily   Continuous Infusions:  sodium chloride     sodium chloride     sodium chloride 10 mL/hr at 06/25/21 1500   sodium chloride     PRN Meds:.sodium chloride, sodium chloride, dextrose, magnesium hydroxide, metoprolol tartrate, morphine injection, ondansetron (ZOFRAN) IV, oxyCODONE, sodium chloride flush, sodium chloride flush, sodium chloride flush, sodium chloride flush, traMADol  General appearance: alert, cooperative, and no distress Neurologic: intact Heart: SR / ST. No pacing required. Lungs: breath sounds clear, diminished.  CT drainage was minimal yesterday but after getting up several times to [facility], he has total of 495ml out for the past 24 hours.  Abdomen: soft, NT. Extremities: mild LE edema. All exts well perfused. RLE EVH incision is dry. Wound: the sternotomy incision is covered with a dry Aquacel dressing.   Lab Results:  CBC: Recent Labs    06/27/21 0335 06/28/21 0353  WBC 12.2* 14.6*  HGB 10.5* 11.7*  HCT 30.2* 34.0*  PLT 98* 133*    BMET:  Recent Labs    06/27/21 0335 06/28/21 0353  NA 129* 130*  K 3.6 4.3  CL 101 101  CO2 24 25  GLUCOSE 86 97  BUN 15 13  CREATININE 0.96 0.91  CALCIUM 7.9* 8.1*     CMET: Lab Results  Component Value Date   WBC 14.6 (H) 06/28/2021   HGB 11.7 (L) 06/28/2021   HCT 34.0 (L) 06/28/2021   PLT 133 (L) 06/28/2021   GLUCOSE 97 06/28/2021   ALT 21 06/21/2021   AST 24 06/21/2021   NA 130 (L) 06/28/2021   K 4.3 06/28/2021   CL 101 06/28/2021   CREATININE 0.91 06/28/2021   BUN 13 06/28/2021   CO2 25 06/28/2021   TSH 2.205 05/07/2021   INR 1.6 (H) 06/25/2021   HGBA1C 6.9 (H) 06/21/2021      PT/INR:  Recent Labs    06/25/21 1506  LABPROT 18.6*  INR 1.6*     Radiology: No results found.   Assessment/Plan: S/P Procedure(s) (LRB): CORONARY ARTERY BYPASS GRAFTING (CABG) X4 USING LEFT INTERNAL MAMMARY ARTERY AND ENDOSCOPICALLY HARVESTED RIGHT GREATER SAPHENOUS VEIN (N/A) ENDOVEIN HARVEST OF GREATER SAPHENOUS VEIN (Right)  --POD3 Elective CABG x 4 for MVCAD presenting with Class III angina and preserved EF. VS stable. On ASA, metoprolol, and Crestor.  In stable SR / ST with no pacing required. Will d/c the pacer today and remove wires tomorrow.   Leave CT's for now and re-assess at noon, hopefully can remove today. Increase the metoprolol to 25mg  BID. Remove dressings today.   -ENDO- H/O well controlled type 2 DM. Glucose control better with addition of Levemir  -HEME- mild expected acute blood loss anemia- Hct trending up  -GI- several loose stools yesterday and over night. No abd discomfort. Change Colace and Dulcolax to PRN.  -RENAL- normal function. Wt 5kg + if accurate. Copntinue diurese, watch Na+, replacing K+.  -PULM- now on RA with O2 sat 99%  CXR improved. No residual effusions, ATX resolved.   -Disposition-Progressing well and expect he will be ready for discharge Sunday or Monday. He was living alone prior to admission.  Says he has a sister and friend locally who will be available to assist him but unclear if will have 24 hour assistance. Will ask TOC team and PT/OT to assist with discharge planning.   Antony Odea, PA-C [phone] 06/28/2021 patient examined and medical record reviewed,agree with above note. [name] 06/28/2021

## 2021-06-28 NOTE — Care Management Important Message (Signed)
Important Message  Patient Details  Name: Sean Kirby MRN: [id number] Date of Birth: 1956-04-02   Medicare Important Message Given:  Yes     Shelda Altes 06/28/2021, 10:30 AM

## 2021-06-28 NOTE — Progress Notes (Signed)
CARDIAC REHAB PHASE I   Went to offer to walk with pt. Pt standing in middle of room. External pacer detached and lying on bed, CT to suction. Pt states multiple bouts of diarrhea this am. Offered to get pt cleaned up. Pt declining at this time. Transport to take pt to Xray. External pacer removed from bed. After some coaxing and reassuring, pt assisted into bed, CT on foot of bed. NT and RN aware. Will f/u as able to ambulate.  [phone] Rufina Falco, RN BSN 06/28/2021 9:01 AM

## 2021-06-28 NOTE — Progress Notes (Signed)
Chest tubes removed per order. No complications and vitals signs stable. Patient tolerated well. Will continue to monitor.  Sean Kirby

## 2021-06-28 NOTE — Progress Notes (Signed)
CARDIAC REHAB PHASE I   Went to offer to walk with pt. Pt in [place] getting ready to administer medications. Will hold ambulation at this time. Will continue to follow.  Rufina Falco, RN BSN 06/28/2021 1:45 PM

## 2021-06-29 ENCOUNTER — Inpatient Hospital Stay (HOSPITAL_COMMUNITY): Payer: Medicare Other

## 2021-06-29 LAB — BASIC METABOLIC PANEL
Anion gap: 11 (ref 5–15)
BUN: 18 mg/dL (ref 8–23)
CO2: 22 mmol/L (ref 22–32)
Calcium: 8.4 mg/dL — ABNORMAL LOW (ref 8.9–10.3)
Chloride: 102 mmol/L (ref 98–111)
Creatinine, Ser: 0.93 mg/dL (ref 0.61–1.24)
GFR, Estimated: >60 mL/min (ref >60)
Glucose, Bld: 87 mg/dL (ref 70–99)
Potassium: 3.8 mmol/L (ref 3.5–5.1)
Sodium: 135 mmol/L (ref 135–145)

## 2021-06-29 LAB — CBC
HCT: 30.4 % — ABNORMAL LOW (ref 39.0–52.0)
Hemoglobin: 10.6 g/dL — ABNORMAL LOW (ref 13.0–17.0)
MCH: 31.8 pg (ref 26.0–34.0)
MCHC: 34.9 g/dL (ref 30.0–36.0)
MCV: 91.3 fL (ref 80.0–100.0)
Platelets: 129 10*3/uL — ABNORMAL LOW (ref 150–400)
RBC: 3.33 MIL/uL — ABNORMAL LOW (ref 4.22–5.81)
RDW: 12.5 % (ref 11.5–15.5)
WBC: 11.3 10*3/uL — ABNORMAL HIGH (ref 4.0–10.5)
nRBC: 0 % (ref 0.0–0.2)

## 2021-06-29 LAB — GLUCOSE, CAPILLARY
Glucose-Capillary: 29 mg/dL — CL (ref 70–99)
Glucose-Capillary: 83 mg/dL (ref 70–99)
Glucose-Capillary: 104 mg/dL — ABNORMAL HIGH (ref 70–99)
Glucose-Capillary: 193 mg/dL — ABNORMAL HIGH (ref 70–99)
Glucose-Capillary: 177 mg/dL — ABNORMAL HIGH (ref 70–99)

## 2021-06-29 LAB — MAGNESIUM: Magnesium: 2.1 mg/dL (ref 1.7–2.4)

## 2021-06-29 MED ORDER — AMIODARONE HCL 200 MG PO TABS
400.0000 mg | ORAL_TABLET | Freq: Two times a day (BID) | ORAL | Status: DC
  Administered 2021-06-29 – 2021-07-02 (×7): 400 mg via ORAL
  Filled 2021-06-29 (×7): qty 2

## 2021-06-29 MED ORDER — AMIODARONE HCL IN DEXTROSE 360-4.14 MG/200ML-% IV SOLN
30.0000 mg/h | INTRAVENOUS | Status: AC
  Administered 2021-06-29: 30 mg/h via INTRAVENOUS
  Filled 2021-06-29: qty 200

## 2021-06-29 MED ORDER — TRAMADOL HCL 50 MG PO TABS
50.0000 mg | ORAL_TABLET | ORAL | Status: DC | PRN
  Administered 2021-07-01: 50 mg via ORAL
  Filled 2021-06-29: qty 1

## 2021-06-29 MED ORDER — POTASSIUM CHLORIDE CRYS ER 20 MEQ PO TBCR
20.0000 meq | EXTENDED_RELEASE_TABLET | Freq: Three times a day (TID) | ORAL | Status: DC
  Administered 2021-06-29 – 2021-06-30 (×6): 20 meq via ORAL
  Filled 2021-06-29 (×6): qty 1

## 2021-06-29 NOTE — Progress Notes (Addendum)
TCTS DAILY ICU PROGRESS NOTE                   [address]            [place] [address]          [phone]   4 Days Post-Op Procedure(s) (LRB): CORONARY ARTERY BYPASS GRAFTING (CABG) X4 USING LEFT INTERNAL MAMMARY ARTERY AND ENDOSCOPICALLY HARVESTED RIGHT GREATER SAPHENOUS VEIN (N/A) ENDOVEIN HARVEST OF GREATER SAPHENOUS VEIN (Right)  Total Length of Stay:  LOS: 4 days   Subjective:  Resting in bed, awake and alert.  Had a CBG reading of 29 early this morning treated with juice po.  Repeat glucose 83.  No further a-fib or flutter over night.   Objective: Vital signs in last 24 hours: Temp:  [98 F (36.7 C)-98.6 F (37 C)] 98.3 F (36.8 C) (01/14 0746) Pulse Rate:  [66-131] 76 (01/14 0746) Cardiac Rhythm: Normal sinus rhythm (01/14 0700) Resp:  [17-19] 18 (01/14 0746) BP: (109-140)/(63-87) 138/87 (01/14 0746) SpO2:  [96 %-99 %] 97 % (01/14 0746) Weight:  [72.4 kg] 72.4 kg (01/14 0500)  Filed Weights   06/26/21 0500 06/27/21 0600 06/29/21 0500  Weight: 70 kg 75.4 kg 72.4 kg    Weight change:    Hemodynamic parameters for last 24 hours:    Intake/Output from previous day: 01/13 0701 - 01/14 0700 In: 1522.2 [P.O.:1080; I.V.:442.2] Out: 2350 [Urine:2280; Chest Tube:70]  Intake/Output this shift: No intake/output data recorded.  Current Meds: Scheduled Meds:  acetaminophen  1,000 mg Oral Q6H   Or   acetaminophen (TYLENOL) oral liquid 160 mg/5 mL  1,000 mg Per Tube Q6H   amiodarone  400 mg Oral BID   aspirin EC  325 mg Oral Daily   Or   aspirin  324 mg Per Tube Daily   Chlorhexidine Gluconate Cloth  6 each Topical Daily   furosemide  20 mg Intravenous BID   insulin aspart  0-24 Units Subcutaneous TID WC & HS   latanoprost  1 drop Both Eyes QHS   mouth rinse  15 mL Mouth Rinse BID   metoprolol tartrate  25 mg Oral BID   pantoprazole  40 mg Oral Daily   potassium chloride  20 mEq Oral TID   rosuvastatin  20 mg Oral Daily   sodium  chloride flush  10-40 mL Intracatheter Q12H   sodium chloride flush  3 mL Intravenous Q12H   sodium chloride flush  3 mL Intravenous Q12H   timolol  1 drop Both Eyes Daily   Continuous Infusions:  sodium chloride     sodium chloride     sodium chloride 10 mL/hr at 06/25/21 1500   sodium chloride     amiodarone     PRN Meds:.sodium chloride, sodium chloride, bisacodyl **OR** bisacodyl, dextrose, docusate sodium, magnesium hydroxide, metoprolol tartrate, morphine injection, ondansetron (ZOFRAN) IV, sodium chloride flush, sodium chloride flush, sodium chloride flush, sodium chloride flush, traMADol  General appearance: alert, cooperative, and no distress Neurologic: intact Heart: SR. No further  fib or flutter on monitor review. Lungs: breath sounds clear, diminished.   Abdomen: soft, NT. Extremities: mild LE edema. All exts well perfused. RLE EVH incision is dry. Wound: the sternotomy incision is open to air and is well approximated and dry.  Lab Results: CBC: Recent Labs    06/28/21 0353 06/29/21 0140  WBC 14.6* 11.3*  HGB 11.7* 10.6*  HCT 34.0* 30.4*  PLT 133* 129*    BMET:  Recent Labs    06/28/21 0353 06/29/21 0140  NA 130* 135  K 4.3 3.8  CL 101 102  CO2 25 22  GLUCOSE 97 87  BUN 13 18  CREATININE 0.91 0.93  CALCIUM 8.1* 8.4*     CMET: Lab Results  Component Value Date   WBC 11.3 (H) 06/29/2021   HGB 10.6 (L) 06/29/2021   HCT 30.4 (L) 06/29/2021   PLT 129 (L) 06/29/2021   GLUCOSE 87 06/29/2021   ALT 18 06/28/2021   AST 52 (H) 06/28/2021   NA 135 06/29/2021   K 3.8 06/29/2021   CL 102 06/29/2021   CREATININE 0.93 06/29/2021   BUN 18 06/29/2021   CO2 22 06/29/2021   TSH 1.159 06/28/2021   INR 1.6 (H) 06/25/2021   HGBA1C 6.9 (H) 06/21/2021      PT/INR:  No results for input(s): LABPROT, INR in the last 72 hours.  Radiology: DG Chest 2 View  Result Date: 06/28/2021 CLINICAL DATA:  Post CABG EXAM: CHEST - 2 VIEW COMPARISON:  Portable exam of  06/27/2021 FINDINGS: Interval removal of RIGHT jugular line. Mediastinal drain and BILATERAL thoracostomy tubes remain. Epicardial pacing wires noted. Normal heart size post CABG. Mediastinal contours and pulmonary vascularity normal. Atherosclerotic calcification aorta. Lungs clear. Trace RIGHT apex pneumothorax. IMPRESSION: Trace RIGHT pneumothorax in patient with BILATERAL thoracostomy tubes. Otherwise stable postsurgical changes without additional abnormalities. Electronically Signed   By: Lavonia Dana M.D.   On: 06/28/2021 09:47     Assessment/Plan: S/P Procedure(s) (LRB): CORONARY ARTERY BYPASS GRAFTING (CABG) X4 USING LEFT INTERNAL MAMMARY ARTERY AND ENDOSCOPICALLY HARVESTED RIGHT GREATER SAPHENOUS VEIN (N/A) ENDOVEIN HARVEST OF GREATER SAPHENOUS VEIN (Right)  --POD4 Elective CABG x 4 for MVCAD presenting with Class III angina and preserved EF. VS stable. On ASA, metoprolol, and Crestor. Remove the pacer wires today.   Progressing slowly with mobility.  Unable to work with therapists as much yesterday due to arrhythmias.  -ENDO- H/O well controlled type 2 DM. Glucose low this morning.  Will stop the Levemir and continue SSI.   -HEME- mild expected acute blood loss anemia- Hct trending up  -GI- no further loose stools over night.   -RENAL- normal function. Wt  about 2kg +.  Continue diuresis another day, watch Na+, replacing K+.  -PULM- now on RA with O2 sat 99%  CXR stable, tiny rightr apical PTX noted after removal of the chest tubes yesterday. No residual effusions, ATX resolved.   -Disposition-Progressing well and expect he will be ready for discharge Sunday or Monday. He was living alone prior to admission. TOC/ PT/OT eval requested. He agrees to short-term rehab at [facility].  Antony Odea, PA-C [phone]   Chart reviewed, patient examined, agree with above. He is back in sinus rhythm on amio and Lopressor.

## 2021-06-29 NOTE — Progress Notes (Signed)
Received patient in bed, pacing wires just removed. Pt's sister at bedside. Discharge OHS education provided. Pt provided the cardiac surgery booklet. Reviewed wound care, activity progression, S/S to report to MD. Encouraged daily weights and discussed S/S of fluid overload. Reviewed exercise recommendations for after SNF placement. Provided information on CRP2 program and referral was placed for [facility]. Discussed and provided education on "move in the tube" and sternal precautions. Provided education and handouts on heart healthy diet, diabetes and low Na diet. Stress continued use of I/S after discharge. Medication compliance stressed. Pt and sister verbalized understanding of education.   Lesly Rubenstein MS, ACSM-CEP, CCRP  9:10 -11:05

## 2021-06-29 NOTE — Evaluation (Signed)
Physical Therapy Evaluation Patient Details Name: Sean Kirby MRN: [id number] DOB: [birth year] Today's Date: 06/29/2021  History of Present Illness  The pt is a 66 yo male presenting 1/10 s/p CABG x4. Hospital course complicated by atrial flutter with HR 160-170s on 1/13. PMH includes: CAD, DM II, and neuropathy.   Clinical Impression  Pt in bed upon arrival of PT, agreeable to evaluation at this time. Prior to admission the pt was independent with mobility and ADLs, lived an active life completing both walking and strength training to maintain his fitness. The pt now presents with minor limitations in functional mobility, dynamic stability, and endurance due to above dx, and will continue to benefit from skilled PT to address these deficits. The pt was able to demo good ability to follow sternal precautions with mobility at this time. He completed sit-stand transfers without UE support or need for physical assist and completed a good bout of hallway ambulation with supervision and RW. He does utilize slowed speed with minimal stride length and narrow BOS, and will benefit from skilled PT to progress the pt's endurance and stability with gait. Suspect he will make good progress with mobility and be safe to return home with family support once medically cleared.   Gait Speed: 0.27m/s using RW and with supervision. (Gait speed <0.78m/s indicates increased risk of falls and dependence in ADLs)         Recommendations for follow up therapy are one component of a multi-disciplinary discharge planning process, led by the attending physician.  Recommendations may be updated based on patient status, additional functional criteria and insurance authorization.  Follow Up Recommendations Outpatient PT (vs no PT pending progress)    Assistance Recommended at Discharge Frequent or constant Supervision/Assistance  Patient can return home with the following  A little help with walking and/or  transfers;Assistance with cooking/housework;Help with stairs or ramp for entrance;Assist for transportation    Equipment Recommendations Rolling walker (2 wheels) (shower chair)  Recommendations for Other Services       Functional Status Assessment Patient has had a recent decline in their functional status and demonstrates the ability to make significant improvements in function in a reasonable and predictable amount of time.     Precautions / Restrictions Precautions Precautions: Sternal Precaution Booklet Issued: Yes (comment) Restrictions Weight Bearing Restrictions: Yes Other Position/Activity Restrictions: sternal precautions      Mobility  Bed Mobility Overal bed mobility: Needs Assistance Bed Mobility: Rolling;Sidelying to Sit Rolling: Supervision Sidelying to sit: Min assist       General bed mobility comments: VCs for technique, min  for trunk    Transfers Overall transfer level: Needs assistance Equipment used: Rolling walker (2 wheels) Transfers: Sit to/from Stand Sit to Stand: Min guard           General transfer comment: no use of UE, good power and stability    Ambulation/Gait Ambulation/Gait assistance: Supervision Gait Distance (Feet): 270 Feet Assistive device: Rolling walker (2 wheels);None Gait Pattern/deviations: Step-through pattern;Decreased stride length;Narrow base of support Gait velocity: 0.5 m/s Gait velocity interpretation: 1.31 - 2.62 ft/sec, indicative of limited community ambulator   General Gait Details: pt initially with very narrow BOS and minimal stride length. able to progress with conitnued mobility and trial short bout of walking in room without UE support.     Balance Overall balance assessment: Mild deficits observed, not formally tested  Pertinent Vitals/Pain Pain Assessment: No/denies pain    Home Living Family/patient expects to be discharged to::  Private residence Living Arrangements: Alone Available Help at Discharge: Family (sister for 2 weeks) Type of Home: House Home Access: Ramped entrance       Home Layout: One level Home Equipment: [place] - single point;Grab bars - tub/shower Additional Comments: Likes to work on his truck, yard work    Prior Function Prior Level of Function : Independent/Modified Independent;Driving             Mobility Comments: pt reports full independence, no recent falls, walks 25 min each day for activity and also participates in aerobics classes and weight training. ADLs Comments: pt reports fully independent     Hand Dominance   Dominant Hand: Right    Extremity/Trunk Assessment   Upper Extremity Assessment Upper Extremity Assessment: Defer to OT evaluation    Lower Extremity Assessment Lower Extremity Assessment: Overall WFL for tasks assessed    Cervical / Trunk Assessment Cervical / Trunk Assessment: Normal;Other exceptions Cervical / Trunk Exceptions: thoracic surgery  Communication   Communication: No difficulties  Cognition Arousal/Alertness: Awake/alert Behavior During Therapy: WFL for tasks assessed/performed Overall Cognitive Status: Within Functional Limits for tasks assessed                                          General Comments General comments (skin integrity, edema, etc.): VSS on RA        Assessment/Plan    PT Assessment Patient needs continued PT services  PT Problem List Decreased activity tolerance;Decreased balance;Decreased mobility       PT Treatment Interventions DME instruction;Gait training;Stair training;Functional mobility training;Therapeutic activities;Therapeutic exercise;Balance training;Patient/family education    PT Goals (Current goals can be found in the Care Plan section)  Acute Rehab PT Goals Patient Stated Goal: return home and to activities PT Goal Formulation: With patient Time For Goal Achievement:  07/13/21 Potential to Achieve Goals: Good    Frequency Min 3X/week     Co-evaluation PT/OT/SLP Co-Evaluation/Treatment: Yes Reason for Co-Treatment: For patient/therapist safety;To address functional/ADL transfers PT goals addressed during session: Mobility/safety with mobility;Balance;Proper use of DME;Strengthening/ROM OT goals addressed during session: ADL's and self-care       AM-PAC PT "6 Clicks" Mobility  Outcome Measure Help needed turning from your back to your side while in a flat bed without using bedrails?: A Little Help needed moving from lying on your back to sitting on the side of a flat bed without using bedrails?: A Little Help needed moving to and from a bed to a chair (including a wheelchair)?: A Little Help needed standing up from a chair using your arms (e.g., wheelchair or bedside chair)?: A Little Help needed to walk in hospital room?: A Little Help needed climbing 3-5 steps with a railing? : A Little 6 Click Score: 18    End of Session Equipment Utilized During Treatment: Gait belt Activity Tolerance: Patient tolerated treatment well Patient left: in chair;with call bell/phone within reach;with chair alarm set;with family/visitor present Nurse Communication: Mobility status PT Visit Diagnosis: Other abnormalities of gait and mobility (R26.89)    Time: 8546-2703 PT Time Calculation (min) (ACUTE ONLY): 35 min   Charges:   PT Evaluation $PT Eval Low Complexity: 1 Low          West Carbo, PT, DPT   Acute Rehabilitation Department Pager #: [phone])  [place] 06/29/2021, 1:41 PM

## 2021-06-29 NOTE — Plan of Care (Signed)
  Problem: Education: Goal: Knowledge of disease or condition will improve Outcome: Progressing Goal: Knowledge of the prescribed therapeutic regimen will improve Outcome: Progressing   Problem: Activity: Goal: Risk for activity intolerance will decrease Outcome: Progressing   

## 2021-06-29 NOTE — Evaluation (Signed)
Occupational Therapy Evaluation Patient Details Name: Sean Kirby MRN: [id number] DOB: 01-Apr-1956 Today's Date: 06/29/2021   History of Present Illness Pt is a 66 yo male s/p CORONARY ARTERY BYPASS GRAFTING (CABG) X4. PHMx: CAD, DM, neuropathy in feet   Clinical Impression   This 66 yo male admitted and underwent above presents to acute OT with PLOF of being totally independent with basic ADLs, IADLs, driving, and working out daily. He currently is setup-min guard A for all basic ADLs. He will continue to benefit from acute OT without need for follow up .      Recommendations for follow up therapy are one component of a multi-disciplinary discharge planning process, led by the attending physician.  Recommendations may be updated based on patient status, additional functional criteria and insurance authorization.   Follow Up Recommendations  No OT follow up    Assistance Recommended at Discharge PRN  Patient can return home with the following Assistance with cooking/housework    Functional Status Assessment  Patient has had a recent decline in their functional status and demonstrates the ability to make significant improvements in function in a reasonable and predictable amount of time.  Equipment Recommendations  None recommended by OT       Precautions / Restrictions Precautions Precautions: Sternal Precaution Booklet Issued: No Restrictions Weight Bearing Restrictions: Yes      Mobility Bed Mobility Overal bed mobility: Needs Assistance Bed Mobility: Rolling;Sidelying to Sit Rolling: Supervision Sidelying to sit: Min assist       General bed mobility comments: VCs for technique, min  for trunk    Transfers Overall transfer level: Needs assistance Equipment used: Rolling walker (2 wheels) Transfers: Sit to/from Stand Sit to Stand: Min guard           General transfer comment: holding cardiac pillow      Balance Overall balance assessment: Mild  deficits observed, not formally tested                                         ADL either performed or assessed with clinical judgement   ADL Overall ADL's : Needs assistance/impaired Eating/Feeding: Independent;Sitting   Grooming: Min guard;Oral care;Standing   Upper Body Bathing: Set up;Sitting   Lower Body Bathing: Min guard;Sit to/from stand   Upper Body Dressing : Set up;Sitting   Lower Body Dressing: Min guard;Sit to/from stand   Toilet Transfer: Min guard;Ambulation;Rolling walker (2 wheels) Toilet Transfer Details (indicate cue type and reason): simulated bed>out and down hallway>back to room sit in recliner Toileting- Clothing Manipulation and Hygiene: Min guard;Sit to/from stand               Vision Baseline Vision/History: 1 Wears glasses Ability to See in Adequate Light: 0 Adequate Patient Visual Report: No change from baseline              Pertinent Vitals/Pain Pain Assessment: No/denies pain     Hand Dominance Right   Extremity/Trunk Assessment Upper Extremity Assessment Upper Extremity Assessment: Overall WFL for tasks assessed           Communication Communication Communication: No difficulties   Cognition Arousal/Alertness: Awake/alert Behavior During Therapy: WFL for tasks assessed/performed Overall Cognitive Status: Within Functional Limits for tasks assessed  Home Living Family/patient expects to be discharged to:: Private residence Living Arrangements: Alone Available Help at Discharge: Family (sister here for 2 weeks from [place]) Type of Home: House Home Access: [place]: One level     Bathroom Shower/Tub: Occupational psychologist: Handicapped height     [place]: Radio producer - single point;Grab bars - tub/shower   Additional Comments: Likes to work on his truck, yard work      Prior  Functioning/Environment Prior Level of Function : Independent/Modified Independent;Driving                        OT Problem List: Impaired balance (sitting and/or standing)      OT Treatment/Interventions: Self-care/ADL training;DME and/or AE instruction;Patient/family education;Balance training    OT Goals(Current goals can be found in the care plan section) Acute Rehab OT Goals Patient Stated Goal: to get back to working out again OT Goal Formulation: With patient Time For Goal Achievement: 07/13/21 Potential to Achieve Goals: Good  OT Frequency: Min 2X/week    Co-evaluation PT/OT/SLP Co-Evaluation/Treatment: Yes Reason for Co-Treatment: To address functional/ADL transfers PT goals addressed during session: Mobility/safety with mobility;Balance;Proper use of DME OT goals addressed during session: ADL's and self-care      AM-PAC OT "6 Clicks" Daily Activity     Outcome Measure Help from another person eating meals?: None Help from another person taking care of personal grooming?: A Little Help from another person toileting, which includes using toliet, bedpan, or urinal?: A Little Help from another person bathing (including washing, rinsing, drying)?: A Little Help from another person to put on and taking off regular upper body clothing?: A Little Help from another person to put on and taking off regular lower body clothing?: A Little 6 Click Score: 19   End of Session Equipment Utilized During Treatment: Gait belt;Rolling walker (2 wheels)  Activity Tolerance: Patient tolerated treatment well Patient left: in chair;with call bell/phone within reach;with chair alarm set  OT Visit Diagnosis: Other abnormalities of gait and mobility (R26.89)                Time: 6203-5597 OT Time Calculation (min): 35 min Charges:  OT General Charges $OT Visit: 1 Visit OT Evaluation $OT Eval Moderate Complexity: 1 Mod  [place], OTR/L Acute [employer] Pager  [phone] Office [phone]    Almon Register 06/29/2021, 1:21 PM

## 2021-06-29 NOTE — Progress Notes (Signed)
°  Hypoglycemic Event  CBG: 29mg /dL  Treatment: 8 oz juice/soda  Symptoms: None  Follow-up CBG: Time:0729 CBG Result:83mg /dL  Possible Reasons for Event: Other  Comments/MD notified:Dr. Ronald Pippins

## 2021-06-30 LAB — BASIC METABOLIC PANEL
Anion gap: 8 (ref 5–15)
BUN: 14 mg/dL (ref 8–23)
CO2: 24 mmol/L (ref 22–32)
Calcium: 8.3 mg/dL — ABNORMAL LOW (ref 8.9–10.3)
Chloride: 102 mmol/L (ref 98–111)
Creatinine, Ser: 1.04 mg/dL (ref 0.61–1.24)
GFR, Estimated: >60 mL/min (ref >60)
Glucose, Bld: 143 mg/dL — ABNORMAL HIGH (ref 70–99)
Potassium: 4.3 mmol/L (ref 3.5–5.1)
Sodium: 134 mmol/L — ABNORMAL LOW (ref 135–145)

## 2021-06-30 LAB — GLUCOSE, CAPILLARY
Glucose-Capillary: 135 mg/dL — ABNORMAL HIGH (ref 70–99)
Glucose-Capillary: 168 mg/dL — ABNORMAL HIGH (ref 70–99)
Glucose-Capillary: 136 mg/dL — ABNORMAL HIGH (ref 70–99)
Glucose-Capillary: 234 mg/dL — ABNORMAL HIGH (ref 70–99)

## 2021-06-30 NOTE — Progress Notes (Addendum)
TCTS DAILY ICU PROGRESS NOTE                   [address]            [place] [address]          [phone]   5 Days Post-Op Procedure(s) (LRB): CORONARY ARTERY BYPASS GRAFTING (CABG) X4 USING LEFT INTERNAL MAMMARY ARTERY AND ENDOSCOPICALLY HARVESTED RIGHT GREATER SAPHENOUS VEIN (N/A) ENDOVEIN HARVEST OF GREATER SAPHENOUS VEIN (Right)  Total Length of Stay:  LOS: 5 days   Subjective:  Just finished breakfast.  No new concerns, says he feels well.   No further a-fib or flutter for ~48 hours.   Objective: Vital signs in last 24 hours: Temp:  [97.9 F (36.6 C)-98.8 F (37.1 C)] 98.1 F (36.7 C) (01/15 0750) Pulse Rate:  [70-82] 74 (01/15 0750) Cardiac Rhythm: Normal sinus rhythm (01/14 1908) Resp:  [16-24] 17 (01/15 0750) BP: (98-142)/(68-85) 128/74 (01/15 0750) SpO2:  [93 %-100 %] 98 % (01/15 0750) Weight:  [68.4 kg] 68.4 kg (01/15 0500)  Filed Weights   06/27/21 0600 06/29/21 0500 06/30/21 0500  Weight: 75.4 kg 72.4 kg 68.4 kg    Weight change: -4.037 kg   Hemodynamic parameters for last 24 hours:    Intake/Output from previous day: 01/14 0701 - 01/15 0700 In: 3 [I.V.:3] Out: 3200 [Urine:3200]  Intake/Output this shift: No intake/output data recorded.  Current Meds: Scheduled Meds:  acetaminophen  1,000 mg Oral Q6H   Or   acetaminophen (TYLENOL) oral liquid 160 mg/5 mL  1,000 mg Per Tube Q6H   amiodarone  400 mg Oral BID   aspirin EC  325 mg Oral Daily   Or   aspirin  324 mg Per Tube Daily   Chlorhexidine Gluconate Cloth  6 each Topical Daily   furosemide  20 mg Intravenous BID   insulin aspart  0-24 Units Subcutaneous TID WC & HS   latanoprost  1 drop Both Eyes QHS   mouth rinse  15 mL Mouth Rinse BID   metoprolol tartrate  25 mg Oral BID   pantoprazole  40 mg Oral Daily   potassium chloride  20 mEq Oral TID   rosuvastatin  20 mg Oral Daily   sodium chloride flush  10-40 mL Intracatheter Q12H   sodium chloride flush  3 mL  Intravenous Q12H   sodium chloride flush  3 mL Intravenous Q12H   timolol  1 drop Both Eyes Daily   Continuous Infusions:  sodium chloride     sodium chloride     sodium chloride 10 mL/hr at 06/25/21 1500   sodium chloride     PRN Meds:.sodium chloride, sodium chloride, bisacodyl **OR** bisacodyl, dextrose, docusate sodium, magnesium hydroxide, metoprolol tartrate, morphine injection, ondansetron (ZOFRAN) IV, sodium chloride flush, sodium chloride flush, sodium chloride flush, sodium chloride flush, traMADol  General appearance: alert, cooperative, and no distress Neurologic: intact Heart: SR. No further  fib or flutter on monitor review. Lungs: breath sounds clear, diminished.   Abdomen: soft, NT. Extremities: LE edema resoolved. All exts well perfused. RLE EVH incision is dry. Wound: the sternotomy incision is open to air and is well approximated and dry.  Lab Results: CBC: Recent Labs    06/28/21 0353 06/29/21 0140  WBC 14.6* 11.3*  HGB 11.7* 10.6*  HCT 34.0* 30.4*  PLT 133* 129*    BMET:  Recent Labs    06/29/21 0140 06/30/21 0149  NA 135 134*  K 3.8 4.3  CL 102 102  CO2 22 24  GLUCOSE 87 143*  BUN 18 14  CREATININE 0.93 1.04  CALCIUM 8.4* 8.3*     CMET: Lab Results  Component Value Date   WBC 11.3 (H) 06/29/2021   HGB 10.6 (L) 06/29/2021   HCT 30.4 (L) 06/29/2021   PLT 129 (L) 06/29/2021   GLUCOSE 143 (H) 06/30/2021   ALT 18 06/28/2021   AST 52 (H) 06/28/2021   NA 134 (L) 06/30/2021   K 4.3 06/30/2021   CL 102 06/30/2021   CREATININE 1.04 06/30/2021   BUN 14 06/30/2021   CO2 24 06/30/2021   TSH 1.159 06/28/2021   INR 1.6 (H) 06/25/2021   HGBA1C 6.9 (H) 06/21/2021      PT/INR:  No results for input(s): LABPROT, INR in the last 72 hours.  Radiology: No results found.   Assessment/Plan: S/P Procedure(s) (LRB): CORONARY ARTERY BYPASS GRAFTING (CABG) X4 USING LEFT INTERNAL MAMMARY ARTERY AND ENDOSCOPICALLY HARVESTED RIGHT GREATER SAPHENOUS  VEIN (N/A) ENDOVEIN HARVEST OF GREATER SAPHENOUS VEIN (Right)  --POD5 Elective CABG x 4 for MVCAD presenting with Class III angina and preserved EF. VS stable. On ASA, metoprolol, and Crestor. Progressing slowly with mobility.    -Post-op atrial fibrillation / flutter- maintaining SR on oral amiodarone.   -ENDO- H/O well controlled type 2 DM. Glucose low yesterday AM so Levemir discontinued. Continue to manage with SSI.   Resume his metformin at discharge.  -HEME- mild expected acute blood loss anemia- Hct trending up  -GI- tolerating regular diet.   -RENAL- normal function. Wt below pre-op. Will stop the Lasix.   -PULM- now on RA with O2 sat 99%  CXR stable, tiny rightr apical PTX noted after removal of the chest tubes. No residual effusions, ATX resolved.   -Disposition-Progressing well and expect he will be ready for discharge . He was living alone prior to admission and has no one who can stay with him immediately after discharge. TOC/ PT/OT eval requested. He agrees to short-term rehab at [facility].  Antony Odea, PA-C [phone]  Chart reviewed, patient examined, agree with above. Doing well overall. Will have to work on SNF placement.

## 2021-06-30 NOTE — Progress Notes (Signed)
Mobility Specialist Progress Note    06/30/21 1055  Mobility  Activity Ambulated in hall  Level of Assistance Standby assist, set-up cues, supervision of patient - no hands on  Assistive Device Front wheel walker  Distance Ambulated (ft) 480 ft  Mobility Ambulated with assistance in hallway  Mobility Response Tolerated well  Mobility performed by Mobility specialist  Bed Position Chair  $Mobility charge 1 Mobility   Pre-Mobility: 71 HR, 124/74 BP During Mobility: 86 HR Post-Mobility: 78 HR  Pt received in bed and agreeable. No complaints on walk. Returned to chair with call bell in reach.   [facility] Mobility Specialist  M.S. 2C and 6E: [phone] M.S. 4E: [phone]

## 2021-07-01 LAB — BASIC METABOLIC PANEL
Anion gap: 6 (ref 5–15)
BUN: 16 mg/dL (ref 8–23)
CO2: 28 mmol/L (ref 22–32)
Calcium: 8.8 mg/dL — ABNORMAL LOW (ref 8.9–10.3)
Chloride: 100 mmol/L (ref 98–111)
Creatinine, Ser: 1.19 mg/dL (ref 0.61–1.24)
GFR, Estimated: >60 mL/min (ref >60)
Glucose, Bld: 137 mg/dL — ABNORMAL HIGH (ref 70–99)
Potassium: 4.5 mmol/L (ref 3.5–5.1)
Sodium: 134 mmol/L — ABNORMAL LOW (ref 135–145)

## 2021-07-01 LAB — GLUCOSE, CAPILLARY
Glucose-Capillary: 140 mg/dL — ABNORMAL HIGH (ref 70–99)
Glucose-Capillary: 168 mg/dL — ABNORMAL HIGH (ref 70–99)
Glucose-Capillary: 115 mg/dL — ABNORMAL HIGH (ref 70–99)
Glucose-Capillary: 158 mg/dL — ABNORMAL HIGH (ref 70–99)

## 2021-07-01 NOTE — Progress Notes (Addendum)
TCTS DAILY ICU PROGRESS NOTE                   [address]            [place] [address]          [phone]   6 Days Post-Op Procedure(s) (LRB): CORONARY ARTERY BYPASS GRAFTING (CABG) X4 USING LEFT INTERNAL MAMMARY ARTERY AND ENDOSCOPICALLY HARVESTED RIGHT GREATER SAPHENOUS VEIN (N/A) ENDOVEIN HARVEST OF GREATER SAPHENOUS VEIN (Right)  Total Length of Stay:  LOS: 6 days   Subjective: Awake and alert, says he didn't rest as well last night.  Progressing with ambulation and transfers.   Objective: Vital signs in last 24 hours: Temp:  [97.5 F (36.4 C)-98.3 F (36.8 C)] 98 F (36.7 C) (01/16 0413) Pulse Rate:  [74-86] 83 (01/16 0413) Cardiac Rhythm: Normal sinus rhythm (01/15 1900) Resp:  [17-20] 20 (01/16 0413) BP: (126-148)/(66-76) 126/73 (01/16 0413) SpO2:  [94 %-100 %] 98 % (01/16 0413) Weight:  [68.7 kg] 68.7 kg (01/16 0413)  Filed Weights   06/29/21 0500 06/30/21 0500 07/01/21 0413  Weight: 72.4 kg 68.4 kg 68.7 kg    Weight change: 0.343 kg   Hemodynamic parameters for last 24 hours:    Intake/Output from previous day: 01/15 0701 - 01/16 0700 In: 3 [I.V.:3] Out: 2950 [Urine:2950]  Intake/Output this shift: No intake/output data recorded.  Current Meds: Scheduled Meds:  amiodarone  400 mg Oral BID   aspirin EC  325 mg Oral Daily   Or   aspirin  324 mg Per Tube Daily   Chlorhexidine Gluconate Cloth  6 each Topical Daily   furosemide  20 mg Intravenous BID   insulin aspart  0-24 Units Subcutaneous TID WC & HS   latanoprost  1 drop Both Eyes QHS   mouth rinse  15 mL Mouth Rinse BID   metoprolol tartrate  25 mg Oral BID   pantoprazole  40 mg Oral Daily   potassium chloride  20 mEq Oral TID   rosuvastatin  20 mg Oral Daily   sodium chloride flush  10-40 mL Intracatheter Q12H   sodium chloride flush  3 mL Intravenous Q12H   sodium chloride flush  3 mL Intravenous Q12H   timolol  1 drop Both Eyes Daily   Continuous Infusions:   sodium chloride     sodium chloride     sodium chloride 10 mL/hr at 06/25/21 1500   sodium chloride     PRN Meds:.sodium chloride, sodium chloride, bisacodyl **OR** bisacodyl, dextrose, docusate sodium, magnesium hydroxide, metoprolol tartrate, morphine injection, ondansetron (ZOFRAN) IV, sodium chloride flush, sodium chloride flush, sodium chloride flush, sodium chloride flush, traMADol  General appearance: alert, cooperative, and no distress Neurologic: intact Heart: SR. No further  fib or flutter on monitor review. Lungs: breath sounds clear, diminished.   Abdomen: soft, NT. Extremities: LE edema resolved. All exts well perfused. RLE EVH incision is dry. Wound: the sternotomy incision is open to air and is well approximated and dry.  Lab Results: CBC: Recent Labs    06/29/21 0140  WBC 11.3*  HGB 10.6*  HCT 30.4*  PLT 129*    BMET:  Recent Labs    06/30/21 0149 07/01/21 0107  NA 134* 134*  K 4.3 4.5  CL 102 100  CO2 24 28  GLUCOSE 143* 137*  BUN 14 16  CREATININE 1.04 1.19  CALCIUM 8.3* 8.8*     CMET: Lab Results  Component Value Date  WBC 11.3 (H) 06/29/2021   HGB 10.6 (L) 06/29/2021   HCT 30.4 (L) 06/29/2021   PLT 129 (L) 06/29/2021   GLUCOSE 137 (H) 07/01/2021   ALT 18 06/28/2021   AST 52 (H) 06/28/2021   NA 134 (L) 07/01/2021   K 4.5 07/01/2021   CL 100 07/01/2021   CREATININE 1.19 07/01/2021   BUN 16 07/01/2021   CO2 28 07/01/2021   TSH 1.159 06/28/2021   INR 1.6 (H) 06/25/2021   HGBA1C 6.9 (H) 06/21/2021      PT/INR:  No results for input(s): LABPROT, INR in the last 72 hours.  Radiology: No results found.   Assessment/Plan: S/P Procedure(s) (LRB): CORONARY ARTERY BYPASS GRAFTING (CABG) X4 USING LEFT INTERNAL MAMMARY ARTERY AND ENDOSCOPICALLY HARVESTED RIGHT GREATER SAPHENOUS VEIN (N/A) ENDOVEIN HARVEST OF GREATER SAPHENOUS VEIN (Right)  --POD6 Elective CABG x 4 for MVCAD presenting with Class III angina and preserved EF. VS stable.  On ASA, metoprolol, and Crestor. Progressing slowly with mobility.    -Post-op atrial fibrillation / flutter- maintaining SR on oral amiodarone.   -ENDO- H/O well controlled type 2 DM. Levemir discontinued due to hypoglycemia. Continue to manage with SSI.   Resume his metformin at discharge.  -HEME- mild expected acute blood loss anemia- Hct trending up  -GI- tolerating regular diet.   -RENAL- normal function. Wt below pre-op. Will stop the Lasix.   -PULM-remains on RA with O2 sat 99%.  -Disposition-Progressing well and expect he will be ready for discharge . He was living alone prior to admission but said this morning that his sister from [place] is staying at his house and may be able to remain for another week. He is not sure of the timing of her return to [place].  He agrees to short-term rehab at [facility] but may not qualify.  Will discuss discharge to home with home health PT.   Sean Odea, PA-C [phone] Keep in hospital today to monitor for arrhythmias and more sessions with PT  patient examined and medical record reviewed,agree with above note. [name] 07/01/2021

## 2021-07-01 NOTE — Progress Notes (Signed)
Occupational Therapy Treatment Patient Details Name: Sean Kirby MRN: [id number] DOB: 03/18/1956 Today's Date: 07/01/2021   History of present illness The pt is a 66 yo male presenting 1/10 s/p CABG x4. Hospital course complicated by atrial flutter with HR 160-170s on 1/13. PMH includes: CAD, DM II, and neuropathy.   OT comments  Pt making steady progress towards OT goals this session. Session focus on functional mobility, BADL reeducation, and education related to maintaining sternal precautions. Pt completed functional mobility greater than a household distance with rollator and min guard assist. Pt completed seated grooming tasks at sink with supervision. Reviewed all sternal precautions in relation to ADL participation with pt verbalizing understanding. Pt would continue to benefit from skilled occupational therapy while admitted to address the below listed limitations in order to improve overall functional mobility and facilitate independence with BADL participation. DC plan remains appropriate, will follow acutely per POC.      Recommendations for follow up therapy are one component of a multi-disciplinary discharge planning process, led by the attending physician.  Recommendations may be updated based on patient status, additional functional criteria and insurance authorization.    Follow Up Recommendations  No OT follow up    Assistance Recommended at Discharge PRN  Patient can return home with the following  Assistance with cooking/housework   Equipment Recommendations  None recommended by OT    Recommendations for Other Services      Precautions / Restrictions Precautions Precautions: Sternal Precaution Booklet Issued: Yes (comment) Precaution Comments: pt able to state all precautions Restrictions Other Position/Activity Restrictions: sternal precautions       Mobility Bed Mobility               General bed mobility comments: sitting in chair at end and  beginning of session but reviewed bed mobility sequencing    Transfers Overall transfer level: Needs assistance Equipment used: Rollator (4 wheels) Transfers: Sit to/from Stand Sit to Stand: Supervision           General transfer comment: supervision to rise from recliner and seat of rollator with good carryover of precautions     Balance Overall balance assessment: Mild deficits observed, not formally tested                                         ADL either performed or assessed with clinical judgement   ADL Overall ADL's : Needs assistance/impaired     Grooming: Oral care;Supervision/safety;Sitting;Cueing for safety Grooming Details (indicate cue type and reason): sitting on rollator at sink, one cue needed to maintain sternal precautions           Upper Body Dressing Details (indicate cue type and reason): demo'ed how to don [place] shirt in order to maintain sternal precautions     Toilet Transfer: Min guard;Ambulation;Rollator (4 wheels) Toilet Transfer Details (indicate cue type and reason): simulated via functional mobility         Functional mobility during ADLs: Min guard;Rollator (4 wheels) General ADL Comments: pt making progress with activity tolerance, sternal precaution education, overall strength and endurance    Extremity/Trunk Assessment Upper Extremity Assessment Upper Extremity Assessment: Overall WFL for tasks assessed   Lower Extremity Assessment Lower Extremity Assessment: Defer to PT evaluation   Cervical / Trunk Assessment Cervical / Trunk Assessment: Normal;Other exceptions Cervical / Trunk Exceptions: thoracic surgery    Vision Baseline Vision/History: 1 Wears  glasses Ability to See in Adequate Light: 0 Adequate Patient Visual Report: No change from baseline     Perception Perception Perception: Within Functional Limits   Praxis Praxis Praxis: Intact    Cognition Arousal/Alertness: Awake/alert Behavior During  Therapy: WFL for tasks assessed/performed Overall Cognitive Status: Within Functional Limits for tasks assessed                                            Exercises     Shoulder Instructions       General Comments VSS on RA HR in the 90s, put together pts shower chair per family member request. reviewed energy conservation strategies and AE    Pertinent Vitals/ Pain       Pain Assessment: No/denies pain  Home Living                                          Prior Functioning/Environment              Frequency  Min 2X/week        Progress Toward Goals  OT Goals(current goals can now be found in the care plan section)  Progress towards OT goals: Progressing toward goals  Acute Rehab OT Goals Patient Stated Goal: to go home OT Goal Formulation: With patient Time For Goal Achievement: 07/13/21 Potential to Achieve Goals: Good  Plan Discharge plan remains appropriate;Frequency remains appropriate    Co-evaluation                 AM-PAC OT "6 Clicks" Daily Activity     Outcome Measure   Help from another person eating meals?: None Help from another person taking care of personal grooming?: None Help from another person toileting, which includes using toliet, bedpan, or urinal?: A Little Help from another person bathing (including washing, rinsing, drying)?: A Little Help from another person to put on and taking off regular upper body clothing?: None Help from another person to put on and taking off regular lower body clothing?: A Little 6 Click Score: 21    End of Session Equipment Utilized During Treatment: Gait belt;Rollator (4 wheels)  OT Visit Diagnosis: Other abnormalities of gait and mobility (R26.89)   Activity Tolerance Patient tolerated treatment well   Patient Left in chair;with call bell/phone within reach;with family/visitor present   Nurse Communication Mobility status        Time: 4492-0100 OT  Time Calculation (min): 28 min  Charges: OT General Charges $OT Visit: 1 Visit OT Treatments $Self Care/Home Management : 23-37 mins  Harley Alto., COTA/L Acute Rehabilitation Services [phone]   Precious Haws 07/01/2021, 2:45 PM

## 2021-07-01 NOTE — Plan of Care (Signed)
°  Problem: Activity: Goal: Risk for activity intolerance will decrease Outcome: Progressing   Problem: Skin Integrity: Goal: Wound healing without signs and symptoms of infection Outcome: Progressing Goal: Risk for impaired skin integrity will decrease Outcome: Progressing

## 2021-07-01 NOTE — TOC Transition Note (Signed)
Transition of Care (TOC) - CM/SW Discharge Note Marvetta Gibbons RN, BSN Transitions of Care Unit 4E- RN Case Manager See Treatment Team for direct phone #    Patient Details  Name: Sean Kirby MRN: [id number] Date of Birth: [birth year]  Transition of Care [facility]) CM/SW Contact:  Dawayne Patricia, RN Phone Number: 07/01/2021, 2:11 PM   Clinical Narrative:    Pt from home, s/p CABG. Sister here from [place] to assist post discharge and will be staying with pt.  TOC received msg from [facility] that TCTS sent pre-op referral protocol orders for HHRN/PT/OT.  CM in to speak with pt and sister at bedside for transition needs.  Discussed with pt and sister that pt is doing well with mobility and will unlikely get approved for rehab stay. Pt stable to return home with sister to assist and sister plans to stay until around 1/26. List provided for [facility] choice- pt and sister would like to look at [facility] for possible HHPT, CM will also have [facility] reach out to them as patient states he has not spoken with them regarding pre-op referral.  Pt requesting rollator instead of RW- will modify DME order, would also like shower chair- discussed that pt will need to pay for shower chair and any copay for DME. Pt voiced understanding and agreeable to use in house provider for DME needs.   Address, phone #, and PCP all confirmed with pt in epic.   Call made to [employer] for DME needs- Rollator and shower chair to be delivered to room prior to discharge.   Call made to [facility] with [facility] for her to f/u with pt and sister regarding pre-op referral and services that they will provide.  Update- [name] returned call after speaking with pt and sister- they are agreeable to use [facility] for [facility] services under [facility]CTS protocol. Will go with [facility] and not [facility].    Final next level of care: [place] Barriers to Discharge: No Barriers Identified   Patient Goals and CMS Choice Patient states  their goals for this hospitalization and ongoing recovery are:: return home CMS Medicare.gov Compare Post Acute Care list provided to:: Patient Choice offered to / list presented to : Patient, Sibling  Discharge Placement               Home w/ [facility]        Discharge Plan and Services   Discharge Planning Services: CM Consult Post Acute Care Choice: Durable Medical Equipment, Home Health          DME Arranged: Shower stool, Walker rolling with seat DME Agency: [employer] Date DME Agency Contacted: 07/01/21 Time DME Agency Contacted: 17 Representative spoke with at DME Agency: [place]: RN, PT, OT [place] Agency: [place] Date [place]: 07/01/21 Time [place]: [id number] Representative spoke with at [place]: [place] ([place]) Interventions     Readmission Risk Interventions Readmission Risk Prevention Plan 07/01/2021  Post Dischage Appt Complete  Medication Screening Complete  Transportation Screening Complete  Some recent data might be hidden

## 2021-07-01 NOTE — Progress Notes (Signed)
CARDIAC REHAB PHASE I   PRE:  Rate/Rhythm: 95 SR  BP:  Sitting: 130/77      SaO2: 96 RA  MODE:  Ambulation: 470 ft   POST:  Rate/Rhythm: 106 ST  BP:  Sitting: 152/77    SaO2: 97 RA  Pt ambulated 417ft in hallway standby assist with front wheel walker. Pt denies CP, dizziness, or SOB. Pt returned to recliner. Reinforced importance of sternal precautions, IS use, and ambulation. Pt requesting rollator and HH. CM aware. Pts sister very concerned about him d/cing home, requesting to speak to MD. CN aware. Pt referred to [facility] [place]. Will continue to follow.  [id number] Rufina Falco, RN BSN 07/01/2021 9:54 AM

## 2021-07-01 NOTE — Progress Notes (Signed)
Mobility Specialist Progress Note   07/01/21 1730  Mobility  Activity Ambulated in hall  Level of Assistance Modified independent, requires aide device or extra time  Assistive Device Four wheel walker  Distance Ambulated (ft) 950 ft  Mobility Ambulated independently in hallway  Mobility Response Tolerated well  Mobility performed by Mobility specialist  Bed Position Chair  $Mobility charge 1 Mobility   Received pt in chair having no complaints and agreeable.Pt demonstrating and verbalizing sternal precautions well, asx throughout ambulation, returned back to chair w/ call bell in reach.  Holland Falling Mobility Specialist Phone Number [phone]

## 2021-07-02 ENCOUNTER — Other Ambulatory Visit: Payer: Self-pay | Admitting: Physician Assistant

## 2021-07-02 LAB — GLUCOSE, CAPILLARY: Glucose-Capillary: 115 mg/dL — ABNORMAL HIGH (ref 70–99)

## 2021-07-02 MED ORDER — ASPIRIN 325 MG PO TBEC: 325.0000 mg | DELAYED_RELEASE_TABLET | Freq: Every day | ORAL | 0 refills | Status: DC

## 2021-07-02 MED ORDER — METOPROLOL TARTRATE 25 MG PO TABS: 25.0000 mg | ORAL_TABLET | Freq: Two times a day (BID) | ORAL | 2 refills | Status: DC

## 2021-07-02 MED ORDER — AMIODARONE HCL 200 MG PO TABS: 400.0000 mg | ORAL_TABLET | Freq: Two times a day (BID) | ORAL | 2 refills | Status: DC

## 2021-07-02 MED ORDER — TRAMADOL HCL 50 MG PO TABS: 50.0000 mg | ORAL_TABLET | Freq: Four times a day (QID) | ORAL | 0 refills | Status: AC | PRN

## 2021-07-02 NOTE — Discharge Instructions (Signed)

## 2021-07-02 NOTE — Progress Notes (Addendum)
TCTS DAILY PROGRESS NOTE                   [address]            [place] [address]          [phone]   7 Days Post-Op Procedure(s) (LRB): CORONARY ARTERY BYPASS GRAFTING (CABG) X4 USING LEFT INTERNAL MAMMARY ARTERY AND ENDOSCOPICALLY HARVESTED RIGHT GREATER SAPHENOUS VEIN (N/A) ENDOVEIN HARVEST OF GREATER SAPHENOUS VEIN (Right)  Total Length of Stay:  LOS: 7 days   Subjective: Awake and alert, feels well. Ready to return home.   Objective: Vital signs in last 24 hours: Temp:  [97.8 F (36.6 C)-99 F (37.2 C)] 97.8 F (36.6 C) (01/17 0723) Pulse Rate:  [73-88] 73 (01/17 0723) Cardiac Rhythm: Normal sinus rhythm (01/16 1900) Resp:  [15-20] 18 (01/17 0723) BP: (116-129)/(67-84) 128/78 (01/17 0723) SpO2:  [93 %-98 %] 94 % (01/17 0723) Weight:  [64.3 kg] 64.3 kg (01/17 0633)  Filed Weights   06/30/21 0500 07/01/21 0413 07/02/21 0539  Weight: 68.4 kg 68.7 kg 64.3 kg    Weight change: -4.425 kg      Intake/Output from previous day: 01/16 0701 - 01/17 0700 In: 970 [P.O.:240; I.V.:730] Out: 1050 [Urine:1050]  Intake/Output this shift: Total I/O In: 240 [P.O.:240] Out: -   Current Meds: Scheduled Meds:  amiodarone  400 mg Oral BID   aspirin EC  325 mg Oral Daily   Or   aspirin  324 mg Per Tube Daily   Chlorhexidine Gluconate Cloth  6 each Topical Daily   insulin aspart  0-24 Units Subcutaneous TID WC & HS   latanoprost  1 drop Both Eyes QHS   mouth rinse  15 mL Mouth Rinse BID   metoprolol tartrate  25 mg Oral BID   pantoprazole  40 mg Oral Daily   rosuvastatin  20 mg Oral Daily   sodium chloride flush  10-40 mL Intracatheter Q12H   sodium chloride flush  3 mL Intravenous Q12H   sodium chloride flush  3 mL Intravenous Q12H   timolol  1 drop Both Eyes Daily   Continuous Infusions:  sodium chloride     sodium chloride     sodium chloride 10 mL/hr at 06/25/21 1500   sodium chloride     PRN Meds:.sodium chloride, sodium chloride,  bisacodyl **OR** bisacodyl, dextrose, docusate sodium, magnesium hydroxide, metoprolol tartrate, ondansetron (ZOFRAN) IV, sodium chloride flush, sodium chloride flush, sodium chloride flush, sodium chloride flush, traMADol  General appearance: alert, cooperative, and no distress Neurologic: intact Heart: SR. No further  fib or flutter on monitor review. Lungs: breath sounds clear. Good O2 sats on RA.  Abdomen: soft, NT. Extremities: LE edema resolved. All exts well perfused. RLE EVH incision is dry. Wound: the sternotomy incision is open to air and is well approximated and dry.  Lab Results: CBC: No results for input(s): WBC, HGB, HCT, PLT in the last 72 hours.  BMET:  Recent Labs    06/30/21 0149 07/01/21 0107  NA 134* 134*  K 4.3 4.5  CL 102 100  CO2 24 28  GLUCOSE 143* 137*  BUN 14 16  CREATININE 1.04 1.19  CALCIUM 8.3* 8.8*     CMET: Lab Results  Component Value Date   WBC 11.3 (H) 06/29/2021   HGB 10.6 (L) 06/29/2021   HCT 30.4 (L) 06/29/2021   PLT 129 (L) 06/29/2021   GLUCOSE 137 (H) 07/01/2021   ALT 18 06/28/2021  AST 52 (H) 06/28/2021   NA 134 (L) 07/01/2021   K 4.5 07/01/2021   CL 100 07/01/2021   CREATININE 1.19 07/01/2021   BUN 16 07/01/2021   CO2 28 07/01/2021   TSH 1.159 06/28/2021   INR 1.6 (H) 06/25/2021   HGBA1C 6.9 (H) 06/21/2021      PT/INR:  No results for input(s): LABPROT, INR in the last 72 hours.  Radiology: No results found.   Assessment/Plan: S/P Procedure(s) (LRB): CORONARY ARTERY BYPASS GRAFTING (CABG) X4 USING LEFT INTERNAL MAMMARY ARTERY AND ENDOSCOPICALLY HARVESTED RIGHT GREATER SAPHENOUS VEIN (N/A) ENDOVEIN HARVEST OF GREATER SAPHENOUS VEIN (Right)  --POD7 Elective CABG x 4 for MVCAD presenting with Class III angina and preserved EF. VS stable. On ASA, metoprolol, and Crestor.  Stable BP. Independent with ambulation and transfers.    -Post-op atrial fibrillation / flutter- maintaining SR on oral amiodarone 400mg  BID.   Will taper dose after discharge.   -ENDO- H/O well controlled type 2 DM.  Resume his metformin at discharge.  -GI- tolerating regular diet.   -RENAL- normal function. Wt below pre-op. No further diuresis needed.  -PULM-remains on RA with O2 sat 99%.  -Disposition-   Discharge to home with home health PT. His sister will be staying with him in his home for the next week. Rolling walker and shower chair have been delivered to his room. Instructions given and follow up arranged.   Antony Odea, PA-C [phone]  patient examined and medical record reviewed,agree with above note. [name] 07/02/2021

## 2021-07-02 NOTE — Discharge Summary (Addendum)
Physician Discharge Summary  Patient ID: Sean Kirby MRN: [id number] DOB/AGE: 1955-08-16 66 y.o.  Admit date: 06/25/2021 Discharge date: 07/02/2021  Admission Diagnoses: Coronary artery disease Angina pectoris Hypertension Dyslipidemia Type 2 diabetes mellitus  Discharge Diagnoses:  Coronary artery disease Angina pectoris Hypertension Dyslipidemia Type 2 diabetes mellitus S/P CABG x 4 Expected acute blood loss anemia Post-op atrial flutter and fibrillation Deconditioned state after major surgery  Discharged Condition: stable  History of Present Illness:  66 year old well-controlled type II diabetic presents for evaluation of recently diagnosed severe multivessel coronary disease with class III symptoms of angina.  He has a history of hypertension and hyperlipidemia.  He is not overweight 80 watches his diet and exercises daily with scheduled walks.  He recently developed substernal discomfort associated with exertion and decreased exercise tolerance.  Cardiac CT scan was performed which showed fairly severe distal circumflex disease and a small PFO.  He underwent left heart cath by Dr. Tamala Julian which showed severe three-vessel coronary disease with a 95% proximal LAD, 90% proximal circumflex stenosis, distal circumflex occlusion beyond the first marginal with atretic distal circumflex vessels.  The right coronary has a 95% stenosis of the proximal posterior descending.  LVEF appears normal and LVEDP was less than 10 mmHg.Marland Kitchen  Patient has been recommended for multivessel coronary bypass grafting.   There is no history of venous disease phlebitis or varicose veins.  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  He also had a negative acetylcholine receptor antibody to rule out myasthenia.  Patient has not had any major previous surgery.  He denies smoking or  pulmonary problems.  He is right-hand dominant and is alone and is retired. He has never had COVID and has received 4 vaccines  After evaluation by Dr. Darcey Nora in the office, coronary bypass grafting was offered and Sean Kirby decided to proceed with surgery.    Hospital Course: Sean Kirby was admitted to the hospital for elective surgery on 06/25/21 where CABG x 4 was accomplished.  Following the procedure he separated from cardiopulmonary bypass without difficulty. He was transfused with FFP prior to transfer to the ICU. Hemodynamics remained stable. He was weaned from the mechanical ventilator support during the afternoon following surgery and was extubated routinely by 6:30pm. The monitoring lines and chest tubes were removed on the first post-op day and he was mobilized.  He was atrially paced for the first postop day due to bradycardia.  His rhythm recovered to stable sinus rhythm in the mid 70s by the second postoperative day so the pacer was discontinued.  Diuresis was initiated for expected volume excess.  He was transferred to 4E progressive care on second postoperative day. He progressed slowly with mobility. He developed atrial flutter on post-op day 3 and could not be successfully converted with rapid atrial pacing. He was loaded with amiodarone IV and did convert back to stable SR. The amiodarone was transitioned to PO. Pacer wires were later removed.  After evaluation by PT and OT, discharge to home with home health PT was felt to be appropriate.  A rolling walker and shower chair were provided to the patient prior to discharge as recommended.   Consults: None  Significant Diagnostic Studies:   CLINICAL DATA:  66 year old male status post chest tube removal.   EXAM: CHEST - 2 VIEW   COMPARISON:  Chest x-ray 06/28/2021.   FINDINGS: Previously noted bilateral chest  tubes have been removed. Probable trace right apical pneumothorax. No appreciable left-sided pneumothorax. No  acute consolidative airspace disease. No pleural effusions. No evidence of pulmonary edema. Heart size is normal. Upper mediastinal contours are within normal limits. Atherosclerotic calcifications are noted in the thoracic aorta. Status post median sternotomy for CABG. Epicardial pacing wires are again noted.   IMPRESSION: 1. Postoperative changes and support apparatus, as above. 2. Probable trace right apical pneumothorax following removal of chest tubes. No definite left-sided pneumothorax.     Electronically Signed   By: Vinnie Langton M.D.   On: 06/29/2021 08:15  Treatments:   Operative Report    DATE OF PROCEDURE: 06/25/2021   PROCEDURES PERFORMED: 1. Coronary artery bypass grafting x 4 (left internal mammary artery to the LAD, saphenous vein graft to the ramus intermediate, saphenous vein graft to the circumflex marginal, saphenous vein graft to the posterior descending).  2. Endoscopic harvest of right leg greater saphenous vein.    PREOPERATIVE DIAGNOSES: Severe 3-vessel coronary artery disease with history of type 2 diabetes and class 3 progressive angina.   POSTOPERATIVE DIAGNOSES: Severe 3-vessel coronary artery disease with history of type 2 diabetes and class 3 progressive angina.   SURGEON:  Ivin Poot, MD   FIRST ASSISTANT: Enid Cutter, PA-C.  An experienced assistant was required for the procedure given the standard of surgical care and the complexity of the operation.  The assistant was needed for exposure, dissection,retraction, accurate suctioning, keeping the anastomotic sutures properly aligned and for overall help during the operation.  Anesthesia:  General by Myrtie Soman, M.D.   CLINICAL NOTE:  The patient is a 66 year old type 2 diabetic who was recently diagnosed with severe 3-vessel coronary artery disease after he presented with episodes of shortness of breath, palpitations, and exertional chest discomfort.  Cardiac  catheterization  demonstrated 95% LAD stenosis, 90% stenosis of the ramus intermediate, 90% stenosis in the ramus and a 90% stenosis of the posterior descending.  LVEF was preserved and there is no significant valvular disease on echo.  He was recommended  for multivessel coronary artery bypass grafting.  I saw the patient in consultation in the office and reviewed the results of his echocardiogram images and  cardiac catheterization images.  I discussed the role of coronary artery bypass surgery for  treatment of his severe 3-vessel coronary artery disease and we discussed the details of surgery to include the use of general anesthesia and cardiopulmonary bypass, the location of the surgical incisions and the expected postoperative hospital recovery.   He understood that the expected benefits of the surgery would include relief of symptoms, improve survival and preservation of LV function.  He understood the risks of the surgery to include the potential for bleeding, blood transfusion, MI, stroke,  organ failure, infection, and death.  He demonstrated his understanding of these issues and agreed to proceed with surgery under what I felt was an informed consent.   OPERATIVE FINDINGS:   1.  Severe diabetic 3-vessel disease. 2.  Good conduit. 3.  Small circumflex marginal target, which was adequate for grafting. 4.  Post-pump coagulopathy requiring FFP and blood transfusion therapy.  Discharge Exam:  Blood pressure 128/78, pulse 73, temperature 97.8 F (36.6 C), temperature source Oral, resp. rate 18, height 5\' 6"  (1.676 m), weight 64.3 kg, SpO2 94 %. General appearance: alert, cooperative, and no distress Neurologic: intact Heart: SR. No further  fib or flutter on monitor review. Lungs: breath sounds clear. Good O2 sats on RA.  Abdomen: soft, NT. Extremities: LE edema resolved. All exts well perfused. RLE EVH incision is dry. Wound: the sternotomy incision is open to air and is well approximated and  dry.  Disposition:  Discharged to home in stable condition with home health PT.   Discharge Instructions     Amb Referral to Cardiac Rehabilitation   Complete by: As directed    Diagnosis: CABG   CABG X ___: 4 Comment - 06/25/2021   After initial evaluation and assessments completed: Virtual Based Care may be provided alone or in conjunction with Phase 2 Cardiac Rehab based on patient barriers.: Yes      Allergies as of 07/02/2021       Reactions   Codeine    Patient unsure of reaction.   Septra [sulfamethoxazole-trimethoprim]    Patient unsure of reaction.   Dapagliflozin Other (See Comments)   (Farxiga) Dizziness & low BP   Sertraline Hcl Other (See Comments)   diarrhea   Glimepiride Other (See Comments)   Sugar bottoms out   Simvastatin Diarrhea        Medication List     STOP taking these medications    amLODipine 5 MG tablet Commonly known as: NORVASC   isosorbide mononitrate 30 MG 24 hr tablet Commonly known as: IMDUR   metoprolol succinate 25 MG 24 hr tablet Commonly known as: Toprol XL   quinapril-hydrochlorothiazide 20-12.5 MG tablet Commonly known as: ACCURETIC       TAKE these medications    alclomethasone 0.05 % cream Commonly known as: ACLOVATE Apply 1 application topically 2 (two) times daily as needed (skin irritation.).   amiodarone 200 MG tablet Commonly known as: PACERONE Take 2 tablets (400 mg total) by mouth 2 (two) times daily. For 5 days then reduce the dose to 1 tablet (200mg ) by mouth twice daily.   aspirin 325 MG EC tablet Take 1 tablet (325 mg total) by mouth daily. What changed:  medication strength how much to take additional instructions   fexofenadine 180 MG tablet Commonly known as: ALLEGRA Take 180 mg by mouth daily as needed for allergies.   glucose blood test strip 1 each by Other route as needed. Use as instructed   latanoprost 0.005 % ophthalmic solution Commonly known as: XALATAN Place 1 drop into both  eyes at bedtime.   LORazepam 0.5 MG tablet Commonly known as: ATIVAN Take 0.5 mg by mouth daily as needed for anxiety.   meclizine 25 MG tablet Commonly known as: ANTIVERT Take 1 tablet (25 mg total) by mouth 3 (three) times daily as needed for dizziness.   metFORMIN 850 MG tablet Commonly known as: GLUCOPHAGE Take 850 mg by mouth 2 (two) times daily with a meal.   metoprolol tartrate 25 MG tablet Commonly known as: LOPRESSOR Take 1 tablet (25 mg total) by mouth 2 (two) times daily.   multivitamin with minerals Tabs tablet Take 1 tablet by mouth in the morning.   rosuvastatin 20 MG tablet Commonly known as: CRESTOR Take 1 tablet (20 mg total) by mouth daily.   timolol 0.5 % ophthalmic solution Commonly known as: TIMOPTIC Place 1 drop into both eyes in the morning.   traMADol 50 MG tablet Commonly known as: ULTRAM Take 1 tablet (50 mg total) by mouth every 6 (six) hours as needed for up to 7 days for moderate pain.               Durable Medical Equipment  (From admission, onward)  Start     Ordered   07/01/21 1109  For home use only DME Walker rolling  Once       Comments: rollator  Question Answer Comment  Walker: With [place] Wheels   Patient needs a walker to treat with the following condition S/P CABG (coronary artery bypass graft)      07/01/21 1108   07/01/21 0838  For home use only DME Shower stool  Once        07/01/21 0837            Follow-up Information     Tobb, Kardie, DO. Go on 07/17/2021.   Specialty: Cardiology Why: Your appointment is at 8:40am. Contact information: [address] [place] [address] [phone]         [name], MD Follow up on 07/22/2021.   Specialty: Cardiothoracic Surgery Why: Your appointment is at 1:30pm.  Please arrive 30 minutes early for a chest x-ray to be performed by [facility] located on the first floor of the same building.        [facility]  Follow up.   Specialty: [place] Why: Latricia Heft)- pre-op referral for any HH needs per [facility] office- RN/PT/OT- they will contact you to schedule Contact information: [place] [address] [phone]         [facility] Follow up.   Why: rollator and shower chair arranged- to be delivered to room prior to discharge Contact information: [place] [address] [phone]                The patient has been discharged on:   1.Beta Blocker:  Yes [  x ]                              No   [   ]                              If No, reason:  2.Ace Inhibitor/ARB: Yes [   ]                                     No  [  x  ]                                     If No, reason: labile BP  3.Statin:   Yes [ x  ]                  No  [   ]                  If No, reason:  4.Ecasa:  Yes  [ x ]                  No   [   ]                  If No, reason:   Signed: Antony Odea, PA-C  07/02/2021, 7:46 AM  DC instructions reviewed with patient patient examined and medical record reviewed,agree with above note. [name] 07/02/2021  DC instructions reviewed with patient patient examined and  medical record reviewed,agree with above note. [name] 07/04/2021

## 2021-07-02 NOTE — Care Management Important Message (Signed)
Important Message  Patient Details  Name: Sean Kirby MRN: [id number] Date of Birth: 05-Jan-1956   Medicare Important Message Given:  Yes     Shelda Altes 07/02/2021, 8:10 AM

## 2021-07-02 NOTE — Progress Notes (Signed)
CARDIAC REHAB PHASE I   D/c education completed with pt. Pt educated on importance of site care and monitoring incisions daily. Encouraged continued IS use, walks, and sternal precautions. DME at bedsdie. Pt referred to CRP II [place]. Pt denies further questions or concerns at this time.  [id number] Rufina Falco, RN BSN 07/02/2021 8:56 AM

## 2021-07-03 DIAGNOSIS — Z48812 Encounter for surgical aftercare following surgery on the circulatory system: Secondary | ICD-10-CM

## 2021-07-10 ENCOUNTER — Telehealth: Payer: Self-pay | Admitting: *Deleted

## 2021-07-10 ENCOUNTER — Ambulatory Visit: Payer: Medicare Other | Admitting: Cardiology

## 2021-07-10 NOTE — Telephone Encounter (Signed)
Patient's HHRN, Sonja, contacted the office requesting a VO for patient to take Tylenol. Per RN, patient c/o incisional chest discomfort but does not want to take Tylenol. Per patient, he was taking Tylenol while in the hospital. Advised patient may take Tylenol for pain as needed. [place] acknowledges receipt.

## 2021-07-11 ENCOUNTER — Telehealth (HOSPITAL_COMMUNITY): Payer: Self-pay

## 2021-07-11 NOTE — Telephone Encounter (Signed)
Pt insurance is active and benefits verified through Medicare A/B. Co-pay $0.00, DED $226.00/$226.00 met, out of pocket $0.00/$0.00 met, co-insurance 20%. No pre-authorization required. Passport, 07/11/21 @ 12:06PM, DIR#[id number]   2ndary insurance is active and benefits verified through [employer]. Co-pay $0.00, DED $0.00/$0.00 met, out of pocket $0.00/$0.00 met, co-insurance 0%. No pre-authorization required. Passport, 07/11/21 @ 12:11PM, UMN#[id number]    Will contact patient to see if he is interested in the Cardiac Rehab Program. If interested, patient will need to complete follow up appt. Once completed, patient will be contacted for scheduling upon review by the RN Navigator.

## 2021-07-11 NOTE — Telephone Encounter (Signed)
Called patient to see if he is interested in the Cardiac Rehab Program. Patient expressed interest. Explained scheduling process and went over insurance, patient verbalized understanding. Will contact patient for scheduling once f/u has been completed.  °

## 2021-07-17 ENCOUNTER — Encounter: Payer: Self-pay | Admitting: Cardiology

## 2021-07-17 ENCOUNTER — Other Ambulatory Visit: Payer: Self-pay

## 2021-07-17 ENCOUNTER — Telehealth: Payer: Self-pay | Admitting: Licensed Clinical Social Worker

## 2021-07-17 ENCOUNTER — Ambulatory Visit (INDEPENDENT_AMBULATORY_CARE_PROVIDER_SITE_OTHER): Payer: Medicare Other | Admitting: Cardiology

## 2021-07-17 VITALS — BP 118/60 | HR 64 | Ht 68.0 in | Wt 145.0 lb

## 2021-07-17 DIAGNOSIS — M79604 Pain in right leg: Secondary | ICD-10-CM | POA: Diagnosis not present

## 2021-07-17 DIAGNOSIS — I2510 Atherosclerotic heart disease of native coronary artery without angina pectoris: Secondary | ICD-10-CM

## 2021-07-17 DIAGNOSIS — E1100 Type 2 diabetes mellitus with hyperosmolarity without nonketotic hyperglycemic-hyperosmolar coma (NKHHC): Secondary | ICD-10-CM | POA: Diagnosis not present

## 2021-07-17 DIAGNOSIS — Z951 Presence of aortocoronary bypass graft: Secondary | ICD-10-CM | POA: Insufficient documentation

## 2021-07-17 DIAGNOSIS — I480 Paroxysmal atrial fibrillation: Secondary | ICD-10-CM

## 2021-07-17 NOTE — Telephone Encounter (Signed)
Referral received for transportation challenges.  I have attempted to reach pt via telephone at [phone]. No answer, message left requesting call back from pt.  Westley Hummer, MSW, [place]  [phone]- work cell phone (preferred) [phone]- desk phone

## 2021-07-17 NOTE — Patient Instructions (Signed)
Medication Instructions:  Your physician recommends that you continue on your current medications as directed. Please refer to the Current Medication list given to you today.  *If you need a refill on your cardiac medications before your next appointment, please call your pharmacy*   Lab Work: None If you have labs (blood work) drawn today and your tests are completely normal, you will receive your results only by: [place] (if you have MyChart) OR A paper copy in the mail If you have any lab test that is abnormal or we need to change your treatment, we will call you to review the results.   Testing/Procedures: Your physician has requested that you have a lower extremity arterial duplex. This test is an ultrasound of the arteries in the legs. It looks at arterial blood flow in the legs. Allow one hour for Lower Arterial scans. There are no restrictions or special instructions    Follow-Up: At [facility], you and your health needs are our priority.  As part of our continuing mission to provide you with exceptional heart care, we have created designated Provider Care Teams.  These Care Teams include your primary Cardiologist (physician) and Advanced Practice Providers (APPs -  Physician Assistants and Nurse Practitioners) who all work together to provide you with the care you need, when you need it.  We recommend signing up for the patient portal called "MyChart".  Sign up information is provided on this After Visit Summary.  MyChart is used to connect with patients for Virtual Visits (Telemedicine).  Patients are able to view lab/test results, encounter notes, upcoming appointments, etc.  Non-urgent messages can be sent to your provider as well.   To learn more about what you can do with MyChart, go to [email or web address].    Your next appointment:   12 week(s)  The format for your next appointment:   In Person  Provider:   Berniece Salines, DO     Other Instructions

## 2021-07-17 NOTE — Progress Notes (Signed)
Cardiology Office Note:    Date:  07/17/2021   ID:  Sean Kirby, DOB 11/01/1955, MRN [id number]  PCP:  Shirline Frees, MD  Cardiologist:  Berniece Salines, DO  Electrophysiologist:  None   Referring MD: Shirline Frees, MD   Chief Complaint  Patient presents with   Follow-up   Edema    Right leg.    History of Present Illness:    Sean Kirby is a 66 y.o. male with a hx of coronary artery disease status post CABG x4 with LIMA to the LAD, saphenous vein graft to the ramus intermedius, saphenous vein graft to the circumflex marginal, saphenous vein graft to the posterior descending arteries on June 25, 2021, diabetes mellitus type 2, mixed hyperlipidemia, depression, GERD.  I first saw the patient on May 27, 2021 at that time he was experiencing intermittent chest discomfort and palpitations.  I recommended a coronary CTA to the patient given his risk factors.  He was agreeable.  He was hypertensive in the office that day but he gave me his blood pressure information from home which was lower than his presentation.  Therefore no antihypertensive medication was adjusted.  I saw the patient on June 14, 2019 to discuss his coronary CT scan which showed severe multivessel coronary artery disease and the need for left heart catheterization.  He proceeded with a left heart catheterization which was done on June 18, 2021 by Dr. Tamala Julian at that time given the severity of his disease it was requested that he he be seen by CT surgery.  He was evaluated by CT surgery and underwent coronary artery bypass grafting on June 25, 2021. He was discharged to home on July 02, 2021.  He is here with his sister today.  His postoperative course was complicated by development of atrial flutter which did not spontaneously convert he was placed on amiodarone and eventually converted back to sinus rhythm.  They both tell me that he has had good home visits with the home health nurses.  His  biggest concern today is the fact that his right total started appears cyanotic.  After he wore compression socks to help with the leg edema.  And after that he is experiencing pain in that leg as well.  Past Medical History:  Diagnosis Date   Anxiety    Arthritis    Basal cell carcinoma (BCC) of cheek 2021   Coronary artery disease 2022   Depression    Diabetes mellitus    GERD (gastroesophageal reflux disease)    Glaucoma    Hyperlipidemia    Hypertension    Hypoglycemia    Neuromuscular disorder (HCC)    neuropathy feet    Past Surgical History:  Procedure Laterality Date   CORONARY ARTERY BYPASS GRAFT N/A 06/25/2021   Procedure: CORONARY ARTERY BYPASS GRAFTING (CABG) X4 USING LEFT INTERNAL MAMMARY ARTERY AND ENDOSCOPICALLY HARVESTED RIGHT GREATER SAPHENOUS VEIN;  Surgeon: [name], MD;  Location: [facility];  Service: Open Heart Surgery;  Laterality: N/A;   ENDOVEIN HARVEST OF GREATER SAPHENOUS VEIN Right 06/25/2021   Procedure: ENDOVEIN HARVEST OF GREATER SAPHENOUS VEIN;  Surgeon: [name], MD;  Location: [facility];  Service: Open Heart Surgery;  Laterality: Right;   GLAUCOMA REPAIR  06/16/2004   lazer-low angle -both   HERNIA REPAIR Right 2018   inguinal   INGUINAL HERNIA REPAIR  12/02/2011   Procedure: HERNIA REPAIR INGUINAL ADULT;  Surgeon: [name], MD;  Location: [facility];  Service: General;  Laterality: Left;  left inguinal   LEFT HEART CATH AND CORONARY ANGIOGRAPHY N/A 06/18/2021   Procedure: LEFT HEART CATH AND CORONARY ANGIOGRAPHY;  Surgeon: [name], MD;  Location: [facility];  Service: Cardiovascular;  Laterality: N/A;   SIGMOIDOSCOPY     TONSILLECTOMY AND ADENOIDECTOMY      Current Medications: Current Meds  Medication Sig   alclomethasone (ACLOVATE) 0.05 % cream Apply 1 application topically 2 (two) times daily as needed (skin irritation.).   amiodarone (PACERONE) 200 MG tablet Take 2 tablets (400 mg total) by mouth  2 (two) times daily. For 5 days then reduce the dose to 1 tablet (200mg ) by mouth twice daily. (Patient taking differently: Take 100 mg by mouth 2 (two) times daily. For 5 days then reduce the dose to 1 tablet (200mg ) by mouth twice daily.)   ASPIRIN 81 PO Take 81 mg by mouth daily.   fexofenadine (ALLEGRA) 180 MG tablet Take 180 mg by mouth daily as needed for allergies.   glucose blood test strip 1 each by Other route as needed. Use as instructed   latanoprost (XALATAN) 0.005 % ophthalmic solution Place 1 drop into both eyes at bedtime.   LORazepam (ATIVAN) 0.5 MG tablet Take 0.5 mg by mouth daily as needed for anxiety.   meclizine (ANTIVERT) 25 MG tablet Take 1 tablet (25 mg total) by mouth 3 (three) times daily as needed for dizziness.   metFORMIN (GLUCOPHAGE) 850 MG tablet Take 850 mg by mouth 2 (two) times daily with a meal.   metoprolol tartrate (LOPRESSOR) 25 MG tablet Take 1 tablet (25 mg total) by mouth 2 (two) times daily.   Multiple Vitamin (MULTIVITAMIN WITH MINERALS) TABS Take 1 tablet by mouth in the morning.   polyethylene glycol (MIRALAX / GLYCOLAX) 17 g packet Take 17 g by mouth daily.   rosuvastatin (CRESTOR) 20 MG tablet Take 1 tablet (20 mg total) by mouth daily.   timolol (TIMOPTIC) 0.5 % ophthalmic solution Place 1 drop into both eyes in the morning.   traMADol (ULTRAM) 50 MG tablet Take 50 mg by mouth every 6 (six) hours as needed.   [DISCONTINUED] aspirin EC 325 MG EC tablet Take 1 tablet (325 mg total) by mouth daily.     Allergies:   Codeine, Septra [sulfamethoxazole-trimethoprim], Dapagliflozin, Sertraline hcl, Glimepiride, and Simvastatin   Social History   Socioeconomic History   Marital status: Single    Spouse name: Not on file   Number of children: 0   Years of education: Not on file   Highest education level: Not on file  Occupational History   Not on file  Tobacco Use   Smoking status: Never   Smokeless tobacco: Never  Vaping Use   Vaping Use: Never  used  Substance and Sexual Activity   Alcohol use: No   Drug use: No   Sexual activity: Not on file  Other Topics Concern   Not on file  Social History Narrative   Right Handed   Lives in a one story home   Does not drink caffeine   Social Determinants of Health   Financial Resource Strain: Not on file  Food Insecurity: Not on file  Transportation Needs: Not on file  Physical Activity: Not on file  Stress: Not on file  Social Connections: Not on file     Family History: The patient's family history includes Heart disease in his maternal aunt, maternal grandmother, and mother. There is no history of Colon cancer.  ROS:   Review of Systems  Constitution: Negative for decreased appetite, fever and weight gain.  HENT: Negative for congestion, ear discharge, hoarse voice and sore throat.   Eyes: Negative for discharge, redness, vision loss in right eye and visual halos.  Cardiovascular: Negative for chest pain, dyspnea on exertion, leg swelling, orthopnea and palpitations.  Respiratory: Negative for cough, hemoptysis, shortness of breath and snoring.   Endocrine: Negative for heat intolerance and polyphagia.  Hematologic/Lymphatic: Negative for bleeding problem. Does not bruise/bleed easily.  Skin: Negative for flushing, nail changes, rash and suspicious lesions.  Musculoskeletal: Negative for arthritis, joint pain, muscle cramps, myalgias, neck pain and stiffness.  Gastrointestinal: Negative for abdominal pain, bowel incontinence, diarrhea and excessive appetite.  Genitourinary: Negative for decreased libido, genital sores and incomplete emptying.  Neurological: Negative for brief paralysis, focal weakness, headaches and loss of balance.  Psychiatric/Behavioral: Negative for altered mental status, depression and suicidal ideas.  Allergic/Immunologic: Negative for HIV exposure and persistent infections.    EKGs/Labs/Other Studies Reviewed:    The following studies were  reviewed today:   EKG: None today  TTE 06/18/2021 IMPRESSIONS     1. Left ventricular ejection fraction, by estimation, is 55 to 60%. The  left ventricle has normal function. The left ventricle has no regional  wall motion abnormalities. Left ventricular diastolic parameters are  consistent with Grade I diastolic  dysfunction (impaired relaxation).   2. Right ventricular systolic function is normal. The right ventricular  size is normal.   3. The mitral valve is normal in structure. No evidence of mitral valve  regurgitation. No evidence of mitral stenosis.   4. The aortic valve is normal in structure. Aortic valve regurgitation is  not visualized. No aortic stenosis is present.   5. There is borderline dilatation of the aortic root, measuring 37 mm.   6. The inferior vena cava is normal in size with greater than 50%  respiratory variability, suggesting right atrial pressure of 3 mmHg.   FINDINGS   Left Ventricle: Left ventricular ejection fraction, by estimation, is 55  to 60%. The left ventricle has normal function. The left ventricle has no  regional wall motion abnormalities. The left ventricular internal cavity  size was normal in size. There is   no left ventricular hypertrophy. Left ventricular diastolic parameters  are consistent with Grade I diastolic dysfunction (impaired relaxation).   Right Ventricle: The right ventricular size is normal. No increase in  right ventricular wall thickness. Right ventricular systolic function is  normal.   Left Atrium: Left atrial size was normal in size.   Right Atrium: Right atrial size was normal in size.   Pericardium: There is no evidence of pericardial effusion.   Mitral Valve: The mitral valve is normal in structure. No evidence of  mitral valve regurgitation. No evidence of mitral valve stenosis.   Tricuspid Valve: The tricuspid valve is normal in structure. Tricuspid valve regurgitation is not demonstrated. No evidence of  tricuspid  stenosis.   Aortic Valve: The aortic valve is normal in structure. Aortic valve regurgitation is not visualized. No aortic stenosis is present. Aortic  valve mean gradient measures 3.0 mmHg. Aortic valve peak gradient measures 5.9 mmHg. Aortic valve area, by VTI  measures 2.79 cm.   Pulmonic Valve: The pulmonic valve was normal in structure. Pulmonic valve regurgitation is not visualized. No evidence of pulmonic stenosis.   Aorta: There is borderline dilatation of the aortic root, measuring 37 mm.   Venous: The inferior vena cava  is normal in size with greater than 50% respiratory variability, suggesting right atrial pressure of 3 mmHg.   IAS/Shunts: No atrial level shunt detected by color flow Doppler.   Recent Labs: 06/28/2021: ALT 18; TSH 1.159 06/29/2021: Hemoglobin 10.6; Magnesium 2.1; Platelets 129 07/01/2021: BUN 16; Creatinine, Ser 1.19; Potassium 4.5; Sodium 134  Recent Lipid Panel No results found for: CHOL, TRIG, HDL, CHOLHDL, VLDL, LDLCALC, LDLDIRECT  Physical Exam:    VS:  BP 118/60 (BP Location: Left Arm, Patient Position: Sitting, Cuff Size: Normal)    Pulse 64    Ht 5\' 8"  (1.727 m)    Wt 145 lb (65.8 kg)    BMI 22.05 kg/m     Wt Readings from Last 3 Encounters:  07/17/21 145 lb (65.8 kg)  07/02/21 141 lb 11.2 oz (64.3 kg)  06/21/21 155 lb (70.3 kg)     GEN: Well nourished, well developed in no acute distress HEENT: Normal NECK: No JVD; No carotid bruits LYMPHATICS: No lymphadenopathy CARDIAC: S1S2 noted,RRR, no murmurs, rubs, gallops RESPIRATORY:  Clear to auscultation without rales, wheezing or rhonchi  ABDOMEN: Soft, non-tender, non-distended, +bowel sounds, no guarding. EXTREMITIES: Right third through second and fourth digit appeared to be cyanotic-there were noted bounding pulses in the right popliteal artery, right anterior tibial artery as well as dorsalis pedis artery, + 1 bilateral edema, No cyanosis, no clubbing MUSCULOSKELETAL:  No  deformity  SKIN: Warm and dry NEUROLOGIC:  Alert and oriented x 3, non-focal PSYCHIATRIC:  Normal affect, good insight  ASSESSMENT:    1. Coronary artery disease involving native coronary artery of native heart without angina pectoris   2. Pain of right lower extremity   3. S/P CABG (coronary artery bypass graft)   4. Type 2 diabetes mellitus with hyperosmolarity without coma, without long-term current use of insulin ([facility])   5. Post op PAF (paroxysmal atrial fibrillation) (HCC)    PLAN:     Coronary artery disease-status post CABG x4 he is recovering.  No chest pain or shortness of breath.  He has planned follow-up with CT surgery in the coming weeks.  Hopefully he will start his cardiac rehab soon.  Continue his aspirin 81 mg daily along with his rosuvastatin 20 mg daily.  Postop atrial fibrillation-I will continue his amiodarone at the same dose for now.  I plan to titrate this down b to 63 y his next visit.  He will remain on the Lopressor 25 mg twice daily.    Diabetes mellitus-this is being managed by his primary care doctor.  No adjustments for antidiabetic medications were made today.  Hyperlipidemia - continue with current statin medication.  He also has been having right leg pain and also did have some digital cyanosis which she tells me started after he started to wear compression stockings.  Advised the patient to hold off on compression stockings.  We will get lower extremity arterial Doppler for completeness.  His sister is here from [place] helping him postdischarge and while he gets settled.  He will need transportation assistance for his follow-up visits and for cardiac rehab once she goes back to [place].  We have referred the patient to social work for this.   The patient is in agreement with the above plan. The patient left the office in stable condition.  The patient will follow up in 12 weeks or sooner if needed.   Medication Adjustments/Labs and Tests  Ordered: Current medicines are reviewed at length with the patient today.  Concerns regarding  medicines are outlined above.  Orders Placed This Encounter  Procedures   VAS [place] LOWER EXTREMITY ARTERIAL DUPLEX   No orders of the defined types were placed in this encounter.   Patient Instructions  Medication Instructions:  Your physician recommends that you continue on your current medications as directed. Please refer to the Current Medication list given to you today.  *If you need a refill on your cardiac medications before your next appointment, please call your pharmacy*   Lab Work: None If you have labs (blood work) drawn today and your tests are completely normal, you will receive your results only by: [place] (if you have MyChart) OR A paper copy in the mail If you have any lab test that is abnormal or we need to change your treatment, we will call you to review the results.   Testing/Procedures: Your physician has requested that you have a lower extremity arterial duplex. This test is an ultrasound of the arteries in the legs. It looks at arterial blood flow in the legs. Allow one hour for Lower Arterial scans. There are no restrictions or special instructions    Follow-Up: At [facility], you and your health needs are our priority.  As part of our continuing mission to provide you with exceptional heart care, we have created designated Provider Care Teams.  These Care Teams include your primary Cardiologist (physician) and Advanced Practice Providers (APPs -  Physician Assistants and Nurse Practitioners) who all work together to provide you with the care you need, when you need it.  We recommend signing up for the patient portal called "MyChart".  Sign up information is provided on this After Visit Summary.  MyChart is used to connect with patients for Virtual Visits (Telemedicine).  Patients are able to view lab/test results, encounter notes, upcoming appointments,  etc.  Non-urgent messages can be sent to your provider as well.   To learn more about what you can do with MyChart, go to [email or web address].    Your next appointment:   12 week(s)  The format for your next appointment:   In Person  Provider:   Berniece Salines, DO     Other Instructions     Adopting a Healthy Lifestyle.  Know what a healthy weight is for you (roughly BMI <25) and aim to maintain this   Aim for 7+ servings of fruits and vegetables daily   65-80+ fluid ounces of water or unsweet tea for healthy kidneys   Limit to max 1 drink of alcohol per day; avoid smoking/tobacco   Limit animal fats in diet for cholesterol and heart health - choose grass fed whenever available   Avoid highly processed foods, and foods high in saturated/trans fats   Aim for low stress - take time to unwind and care for your mental health   Aim for 150 min of moderate intensity exercise weekly for heart health, and weights twice weekly for bone health   Aim for 7-9 hours of sleep daily   When it comes to diets, agreement about the perfect plan isnt easy to find, even among the experts. Experts at the [employer] developed an idea known as the Healthy Eating Plate. Just imagine a plate divided into logical, healthy portions.   The emphasis is on diet quality:   Load up on vegetables and fruits - one-half of your plate: Aim for color and variety, and remember that potatoes dont count.   Go for whole grains - one-quarter of  your plate: Whole wheat, barley, wheat berries, quinoa, oats, brown rice, and foods made with them. If you want pasta, go with whole wheat pasta.   Protein power - one-quarter of your plate: Fish, chicken, beans, and nuts are all healthy, versatile protein sources. Limit red meat.   The diet, however, does go beyond the plate, offering a few other suggestions.   Use healthy plant oils, such as olive, canola, soy, corn, sunflower and peanut. Check  the labels, and avoid partially hydrogenated oil, which have unhealthy trans fats.   If youre thirsty, drink water. Coffee and tea are good in moderation, but skip sugary drinks and limit milk and dairy products to one or two daily servings.   The type of carbohydrate in the diet is more important than the amount. Some sources of carbohydrates, such as vegetables, fruits, whole grains, and beans-are healthier than others.   Finally, stay active  Signed, Berniece Salines, DO  07/17/2021 9:04 PM    [facility]

## 2021-07-18 ENCOUNTER — Telehealth: Payer: Self-pay | Admitting: Licensed Clinical Social Worker

## 2021-07-18 NOTE — Progress Notes (Signed)
Heart and Vascular Care Navigation  07/18/2021  Sean Kirby 31-Oct-1955 [id number]  Reason for Referral:   Transportation issues Engaged with patient by telephone for initial visit for Heart and Vascular Care Coordination.                                                                                                   Assessment:              LCSW reached out to pt again this morning at [phone]. Able to reach pt via telephone. Introduced self, role, reason for call. Pt confirms home address, PCP, and his/emergency contact information. Pt lives alone, his sister is here from [place] and has been assisting him with his care. She will return home on 2/11, the current concern is that he is waiting to see what his visit with Dr. Darcey Nora on 2/6 will say about his ability to drive, as he normally drives himself to appointments but has had driving restrictions. I explained that there are various community groups that can assist with rides as well as city transportation. If none of these are options then pt may be able to utilize [employer] but I explained that at this time it is focused on uninsured/underinsured patients. Pt states understanding, he would like resources mailed and emailed him.   No additional concerns noted at this time regarding other SDOH needs. Pt understands if any additional questions arise I remain available.                           HRT/VAS Care Coordination     Patients Home Cardiology Office [place] Team Social Worker   Social Worker Name: Valeda Malm, [place] [phone]   Living arrangements for the past 2 months Single Family Home   Lives with: Self   Patient Current Insurance Coverage Traditional Medicare  Medicare A&B and BCBS Supplement   Patient Has Concern With Paying Medical Bills No   Does Patient Have Prescription Coverage? Yes   Home Assistive Devices/Equipment Cane (specify quad or  straight)   DME Agency [employer]   [place]       Social History:                                                                             SDOH Screenings   Alcohol Screen: Not on file  Depression (PHQ2-9): Not on file  Financial Resource Strain: Low Risk    Difficulty of Paying Living Expenses: Not very hard  Food Insecurity: No Food Insecurity   Worried About Running Out of Food in the Last Year: Never true   Ran Out of Food in the Last Year: Never true  Housing: Low Risk  Last Housing Risk Score: 0  Physical Activity: Not on file  Social Connections: Not on file  Stress: Not on file  Tobacco Use: Low Risk    Smoking Tobacco Use: Never   Smokeless Tobacco Use: Never   Passive Exposure: Not on file  Transportation Needs: No Transportation Needs   Lack of Transportation (Medical): No   Lack of Transportation (Non-Medical): No    SDOH Interventions: Financial Resources:  Sales promotion account executive Interventions: Intervention Not Indicated   Food Insecurity:  Food Insecurity Interventions: Intervention Not Indicated  Housing Insecurity:  Housing Interventions: Intervention Not Indicated  Transportation:   Transportation Interventions: Financial planner, Other (Comment)    Other Care Navigation Interventions:     Provided Pharmacy assistance resources  No concerns noted at this time.    Follow-up plan:   LCSW has mailed and emailed information about transportation to pt, I also sent [facility] information as pt is a Medicare recipient. I will f/u following [facility] appt to see status of transportation need. Pt also sent my contact information and encouraged to f/u if can be of any additional assistance.

## 2021-07-19 ENCOUNTER — Other Ambulatory Visit: Payer: Self-pay | Admitting: Cardiothoracic Surgery

## 2021-07-19 DIAGNOSIS — Z951 Presence of aortocoronary bypass graft: Secondary | ICD-10-CM

## 2021-07-22 ENCOUNTER — Encounter: Payer: Self-pay | Admitting: Cardiothoracic Surgery

## 2021-07-22 ENCOUNTER — Ambulatory Visit (INDEPENDENT_AMBULATORY_CARE_PROVIDER_SITE_OTHER): Payer: Self-pay | Admitting: Cardiothoracic Surgery

## 2021-07-22 ENCOUNTER — Ambulatory Visit
Admission: RE | Admit: 2021-07-22 | Discharge: 2021-07-22 | Disposition: A | Discharge status: Home / Self Care | Payer: Medicare Other | Source: Ambulatory Visit | Attending: Cardiothoracic Surgery | Admitting: Cardiothoracic Surgery

## 2021-07-22 ENCOUNTER — Other Ambulatory Visit: Payer: Self-pay

## 2021-07-22 VITALS — BP 132/72 | HR 64 | Resp 20 | Ht 68.0 in | Wt 146.2 lb

## 2021-07-22 DIAGNOSIS — Z951 Presence of aortocoronary bypass graft: Secondary | ICD-10-CM

## 2021-07-22 MED ORDER — ZOLPIDEM TARTRATE ER 6.25 MG PO TBCR: 6.2500 mg | EXTENDED_RELEASE_TABLET | Freq: Every evening | ORAL | 0 refills | Status: DC | PRN

## 2021-07-22 NOTE — Progress Notes (Signed)
HPI: Patient returns for routine postoperative follow-up having undergone CABG x4 on June 25, 2021. The patient's early postoperative recovery while in the hospital was notable for atrial flutter and fibrillation which converted to sinus rhythm on amiodarone. Since hospital discharge the patient reports he has lost 5 pounds but appetite is slowly improving and his weight has stabilized.  He denies recurrent angina ankle edema or shortness of breath.  He complains of constipation and insomnia.  He has been seen by his cardiologist who is landed a gram to wean the amiodarone off slowly.   Current Outpatient Medications  Medication Sig Dispense Refill   alclomethasone (ACLOVATE) 0.05 % cream Apply 1 application topically 2 (two) times daily as needed (skin irritation.).     amiodarone (PACERONE) 200 MG tablet Take 2 tablets (400 mg total) by mouth 2 (two) times daily. For 5 days then reduce the dose to 1 tablet (200mg ) by mouth twice daily. (Patient taking differently: Take 100 mg by mouth 2 (two) times daily. For 5 days then reduce the dose to 1 tablet (200mg ) by mouth twice daily.) 60 tablet 2   ASPIRIN 81 PO Take 81 mg by mouth daily.     fexofenadine (ALLEGRA) 180 MG tablet Take 180 mg by mouth daily as needed for allergies.     glucose blood test strip 1 each by Other route as needed. Use as instructed     latanoprost (XALATAN) 0.005 % ophthalmic solution Place 1 drop into both eyes at bedtime.     LORazepam (ATIVAN) 0.5 MG tablet Take 0.5 mg by mouth daily as needed for anxiety.     meclizine (ANTIVERT) 25 MG tablet Take 1 tablet (25 mg total) by mouth 3 (three) times daily as needed for dizziness. 30 tablet 0   metFORMIN (GLUCOPHAGE) 850 MG tablet Take 850 mg by mouth 2 (two) times daily with a meal.     metoprolol tartrate (LOPRESSOR) 25 MG tablet Take 1 tablet (25 mg total) by mouth 2 (two) times daily. 60 tablet 2   Multiple Vitamin (MULTIVITAMIN WITH MINERALS) TABS Take 1 tablet by  mouth in the morning.     polyethylene glycol (MIRALAX / GLYCOLAX) 17 g packet Take 17 g by mouth daily.     rosuvastatin (CRESTOR) 20 MG tablet Take 1 tablet (20 mg total) by mouth daily. 90 tablet 3   timolol (TIMOPTIC) 0.5 % ophthalmic solution Place 1 drop into both eyes in the morning.     traMADol (ULTRAM) 50 MG tablet Take 50 mg by mouth every 6 (six) hours as needed.     zolpidem (AMBIEN CR) 6.25 MG CR tablet Take 1 tablet (6.25 mg total) by mouth at bedtime as needed for sleep. 30 tablet 0   No current facility-administered medications for this visit.    Physical Exam: Alert and comfortable Sinus rhythm regular heart rhythm No murmur Lungs clear Sternal incision well-healed Leg incision well-healed Some dissection of the blood from vein harvest down to some of his right foot.  Diagnostic Tests: Chest x-ray performed today is personally reviewed showing clear lung fields sternal wires intact cardiac silhouette stable  Impression: Doing very well 3 weeks after CABG x4. We will call in some Ambien to help him sleep. He can now drive and lift up to 10 pounds. Continue other medications. Return for follow-up visit in 8 weeks to discuss release of limitations on activity  Plan: Return in 8 weeks for follow-up   [name], MD [facility] [phone]

## 2021-07-24 ENCOUNTER — Telehealth: Payer: Self-pay | Admitting: *Deleted

## 2021-07-24 NOTE — Telephone Encounter (Signed)
Roselyn Reef from [facility] contacted the office stating they are unable get fill patient's Ambien prescription due to availability.  Contacted patient who stated he received a prescription from his PCP Dr. Kenton Kingfisher for a sleep. Ambien prescription canceled for [facility] on [address].

## 2021-07-25 ENCOUNTER — Telehealth: Payer: Self-pay | Admitting: Licensed Clinical Social Worker

## 2021-07-25 NOTE — Telephone Encounter (Signed)
LCSW reached out to pt this morning via telephone, was able to reach him at [phone]. Re-introduced self, role, reason for call. Pt confirmed he was cleared and has been driving since appt w/ Dr. Darcey Nora. So far he is comfortable doing so. I encouraged him to reach out again if any additional questions/concerns arise.   Westley Hummer, MSW, [place]  [phone]- work cell phone (preferred) [phone]- desk phone

## 2021-07-29 ENCOUNTER — Other Ambulatory Visit: Payer: Self-pay | Admitting: Cardiology

## 2021-07-29 DIAGNOSIS — M79604 Pain in right leg: Secondary | ICD-10-CM

## 2021-07-29 DIAGNOSIS — R230 Cyanosis: Secondary | ICD-10-CM

## 2021-07-31 ENCOUNTER — Other Ambulatory Visit: Payer: Self-pay

## 2021-07-31 ENCOUNTER — Ambulatory Visit (HOSPITAL_COMMUNITY)
Admission: RE | Admit: 2021-07-31 | Discharge: 2021-07-31 | Disposition: A | Discharge status: Home / Self Care | Payer: Medicare Other | Source: Ambulatory Visit | Attending: Cardiology | Admitting: Cardiology

## 2021-07-31 DIAGNOSIS — R230 Cyanosis: Secondary | ICD-10-CM | POA: Diagnosis present

## 2021-07-31 DIAGNOSIS — M79604 Pain in right leg: Secondary | ICD-10-CM | POA: Insufficient documentation

## 2021-08-12 ENCOUNTER — Telehealth: Payer: Self-pay | Admitting: Cardiology

## 2021-08-12 MED ORDER — METOPROLOL TARTRATE 25 MG PO TABS: 12.5000 mg | ORAL_TABLET | Freq: Two times a day (BID) | ORAL | 3 refills | Status: DC

## 2021-08-12 NOTE — Telephone Encounter (Signed)
Patient states for the past five days, his P has been in the mid 21s. Today pulse is 52, BP 134/72. Take medications as prescribed.  Fingerstick glucose this morning 117. Please advise on heart rate.

## 2021-08-12 NOTE — Telephone Encounter (Signed)
Called pt with Dr. Terrial Rhodes recommendation to decrease Lopressor to 12.5 mg twice daily. Pt states "well the lady I spoke with earlier said Dr. Harriet Masson is not aware I'm taking Amlodipine 5 mg at once daily. I've been on it for 5 years." After consulting with Dr. Harriet Masson no change on plan at this time. Pt verbalized understanding, no further questions at this time. Pt's medication list updated.

## 2021-08-12 NOTE — Addendum Note (Signed)
Addended by: Orvan July on: 08/12/2021 11:08 AM   Modules accepted: Orders

## 2021-08-12 NOTE — Telephone Encounter (Signed)
Please decrease his Lopressor to 12.5 mg twice daily.

## 2021-08-12 NOTE — Telephone Encounter (Signed)
STAT if HR is under 50 or over 120 (normal HR is 60-100 beats per minute)  What is your heart rate? 52  Do you have a log of your heart rate readings (document readings)? Ranging in the 50's for about a week   Do you have any other symptoms? Kind of tired

## 2021-08-14 ENCOUNTER — Ambulatory Visit: Payer: Medicare Other | Admitting: Cardiology

## 2021-08-20 ENCOUNTER — Encounter: Payer: Self-pay | Admitting: Cardiology

## 2021-08-23 ENCOUNTER — Telehealth (HOSPITAL_COMMUNITY): Payer: Self-pay

## 2021-08-23 NOTE — Telephone Encounter (Signed)
Called patient to see if he was interested in participating in the Cardiac Rehab Program. Patient stated yes. Patient will come in for orientation on 4/4 @ 11AM and will attend the 1PM exercise class. ?  ?Mailed package ?

## 2021-09-16 ENCOUNTER — Ambulatory Visit: Payer: Medicare Other | Admitting: Cardiothoracic Surgery

## 2021-09-16 ENCOUNTER — Telehealth (HOSPITAL_COMMUNITY): Payer: Self-pay | Admitting: *Deleted

## 2021-09-16 NOTE — Telephone Encounter (Signed)
Spoke with Sean Kirby competed health history. Confirmed appointment for tomorrow.Barnet Pall, RN,BSN ?09/16/2021 5:41 PM  ?

## 2021-09-17 ENCOUNTER — Other Ambulatory Visit: Payer: Self-pay

## 2021-09-17 ENCOUNTER — Other Ambulatory Visit: Payer: Self-pay | Admitting: Physician Assistant

## 2021-09-17 ENCOUNTER — Telehealth: Payer: Self-pay

## 2021-09-17 ENCOUNTER — Encounter (HOSPITAL_COMMUNITY): Payer: Self-pay

## 2021-09-17 ENCOUNTER — Encounter (HOSPITAL_COMMUNITY)
Admission: RE | Admit: 2021-09-17 | Discharge: 2021-09-17 | Disposition: A | Discharge status: Home / Self Care | Payer: Medicare Other | Source: Ambulatory Visit | Attending: Cardiology | Admitting: Cardiology

## 2021-09-17 VITALS — BP 137/70 | HR 66 | Ht 68.75 in | Wt 147.5 lb

## 2021-09-17 DIAGNOSIS — Z79899 Other long term (current) drug therapy: Secondary | ICD-10-CM | POA: Insufficient documentation

## 2021-09-17 DIAGNOSIS — Z951 Presence of aortocoronary bypass graft: Secondary | ICD-10-CM | POA: Insufficient documentation

## 2021-09-17 NOTE — Progress Notes (Signed)
Cardiac Individual Treatment Plan ? ?Patient Details  ?Name: Sean Kirby ?MRN: [id number] ?Date of Birth: June 06, 1956 ?Referring Provider:   ?Flowsheet Row CARDIAC REHAB PHASE II ORIENTATION from 09/17/2021 in [facility]  ?Referring Provider Dr. Harriet Masson  ? ?  ? ? ?Initial Encounter Date:  ?Flowsheet Row CARDIAC REHAB PHASE II ORIENTATION from 09/17/2021 in [facility]  ?Date 09/17/21  ? ?  ? ? ?Visit Diagnosis: 06/25/21 S/P CABG x 4 ? ?Patient's Home Medications on Admission: ? ?Current Outpatient Medications:  ?  acetaminophen (TYLENOL) 500 MG tablet, Take 1,000 mg by mouth every 6 (six) hours as needed (for pain.)., Disp: , Rfl:  ?  alclomethasone (ACLOVATE) 0.05 % cream, Apply 1 application topically 2 (two) times daily as needed (skin irritation.)., Disp: , Rfl:  ?  amiodarone (PACERONE) 200 MG tablet, Take 2 tablets (400 mg total) by mouth 2 (two) times daily. For 5 days then reduce the dose to 1 tablet ('200mg'$ ) by mouth twice daily. (Patient taking differently: Take 200 mg by mouth 2 (two) times daily. For 5 days then reduce the dose to 1 tablet ('200mg'$ ) by mouth twice daily.), Disp: 60 tablet, Rfl: 2 ?  amLODipine (NORVASC) 5 MG tablet, Take 5 mg by mouth at bedtime., Disp: , Rfl:  ?  aspirin 81 MG EC tablet, Take 81 mg by mouth in the morning., Disp: , Rfl:  ?  fexofenadine (ALLEGRA) 180 MG tablet, Take 180 mg by mouth daily as needed for allergies., Disp: , Rfl:  ?  glucose blood test strip, 1 each by Other route as needed. Use as instructed, Disp: , Rfl:  ?  latanoprost (XALATAN) 0.005 % ophthalmic solution, Place 1 drop into both eyes at bedtime., Disp: , Rfl:  ?  LORazepam (ATIVAN) 0.5 MG tablet, Take 0.5 mg by mouth daily as needed for anxiety., Disp: , Rfl:  ?  metFORMIN (GLUCOPHAGE) 850 MG tablet, Take 850 mg by mouth 2 (two) times daily with a meal., Disp: , Rfl:  ?  Multiple Vitamin (MULTIVITAMIN WITH MINERALS) TABS, Take 2 tablets by mouth  in the morning. gummy, Disp: , Rfl:  ?  polyethylene glycol (MIRALAX / GLYCOLAX) 17 g packet, Take 17 g by mouth daily as needed (constipation)., Disp: , Rfl:  ?  rosuvastatin (CRESTOR) 20 MG tablet, Take 1 tablet (20 mg total) by mouth daily., Disp: 90 tablet, Rfl: 3 ?  timolol (TIMOPTIC) 0.5 % ophthalmic solution, Place 1 drop into both eyes in the morning., Disp: , Rfl:  ?  meclizine (ANTIVERT) 25 MG tablet, Take 1 tablet (25 mg total) by mouth 3 (three) times daily as needed for dizziness. (Patient not taking: Reported on 09/11/2021), Disp: 30 tablet, Rfl: 0 ? ?Past Medical History: ?Past Medical History:  ?Diagnosis Date  ? Anxiety   ? Arthritis   ? Basal cell carcinoma (BCC) of cheek 2021  ? Coronary artery disease 2022  ? Depression   ? Diabetes mellitus   ? GERD (gastroesophageal reflux disease)   ? Glaucoma   ? Hyperlipidemia   ? Hypertension   ? Hypoglycemia   ? Neuromuscular disorder ([place])   ? neuropathy feet  ? ? ?Tobacco Use: ?Social History  ? ?Tobacco Use  ?Smoking Status Never  ?Smokeless Tobacco Never  ? ? ?Labs: ?Review Flowsheet   ? ?  ?  Latest Ref Rng & Units 06/21/2021 06/25/2021 06/26/2021  ?Labs for ITP Cardiac and Pulmonary Rehab  ?Hemoglobin A1c 4.8 -  5.6 % 6.9      ?PH, Arterial 7.350 - 7.450 7.434   7.319    ? 7.352    ? 7.344    ? 7.387    ? 7.429    ? 7.508    ? 7.497    ? 7.469    ? 7.450    ? 7.417    ? 7.417   7.386    ?PCO2 arterial 32.0 - 48.0 mmHg 39.1   38.2    ? 39.8    ? 44.9    ? 37.3    ? 36.6    ? 31.6    ? 34.8    ? 37.3    ? 39.1    ? 43.1    ? 45.2   36.8    ?Bicarbonate 20.0 - 28.0 mmol/L 25.7   19.7    ? 22.3    ? 24.7    ? 22.4    ? 24.2    ? 25.1    ? 27.0    ? 27.1    ? 27.1    ? 26.1    ? 27.7    ? 29.1   22.0    ?TCO2 22 - 32 mmol/L  21    ? 24    ? 26    ? 23    ? 23    ? 25    ? 24    ? 26    ? 25    ? 28    ? 26    ? 28    ? 28    ? 27    ? 27    ? 29    ? 29    ? 30    ? 28   23    ?Acid-base deficit 0.0 - 2.0 mmol/L  6.0    ? 3.0    ? 1.0    ? 2.0   3.0    ?O2  Saturation % 98.0   97.0    ? 98.0    ? 95.0    ? 99.0    ? 100.0    ? 100.0    ? 100.0    ? 100.0    ? 100.0    ? 85.0    ? 100.0    ? 100.0   98.0    ?  ? ? Multiple values from one day are sorted in reverse-chronological order  ?  ?  ? ? ?Capillary Blood Glucose: ?Lab Results  ?Component Value Date  ? GLUCAP 115 (H) 07/02/2021  ? GLUCAP 158 (H) 07/01/2021  ? GLUCAP 115 (H) 07/01/2021  ? GLUCAP 168 (H) 07/01/2021  ? GLUCAP 140 (H) 07/01/2021  ? ? ? ?Exercise Target Goals: ?Exercise Program Goal: ?Individual exercise prescription set using results from initial 6 min walk test and THRR while considering  patient?s activity barriers and safety.  ? ?Exercise Prescription Goal: ?Starting with aerobic activity 30 plus minutes a day, 3 days per week for initial exercise prescription. Provide home exercise prescription and guidelines that participant acknowledges understanding prior to discharge. ? ?Activity Barriers & Risk Stratification: ? Activity Barriers & Cardiac Risk Stratification - 09/17/21 1336   ? ?  ? Activity Barriers & Cardiac Risk Stratification  ? Activity Barriers Back Problems;Deconditioning;Muscular Weakness;Balance Concerns   ? Cardiac Risk Stratification High   ? ?  ?  ? ?  ? ? ?6 Minute Walk: ? 6 Minute Walk   ? ?  [place] Name 09/17/21 1334  ?  ?  ?  ? 6 Minute Walk  ? Phase Initial    ? Distance 1065 feet    ? Walk Time 6 minutes    ? # of Rest Breaks 0    ? MPH 2.02    ? METS 2.79    ? RPE 9    ? Perceived Dyspnea  0    ? VO2 Peak 9.76    ? Symptoms No    ? Resting HR 64 bpm    ? Resting BP 137/70    ? Resting Oxygen Saturation  98 %    ? Exercise Oxygen Saturation  during 6 min walk 98 %    ? Max Ex. HR 72 bpm    ? Max Ex. BP 138/70    ? 2 Minute Post BP 130/64    ? ?  ?  ? ?  ? ? ?Oxygen Initial Assessment: ? ? ?Oxygen Re-Evaluation: ? ? ?Oxygen Discharge (Final Oxygen Re-Evaluation): ? ? ?Initial Exercise Prescription: ? Initial Exercise Prescription - 09/17/21 1300   ? ?  ? Date of Initial Exercise  RX and Referring Provider  ? Date 09/17/21   ? Referring Provider Dr. Harriet Masson   ? Expected Discharge Date 11/15/21   ?  ? NuStep  ? Level 1   ? SPM 75   ? Minutes 30   ? METs 2   ?  ? Prescription Details  ? Frequency (times per week) 3   ? Duration Progress to 30 minutes of continuous aerobic without signs/symptoms of physical distress   ?  ? Intensity  ? THRR 40-80% of Max Heartrate 61-122   ? Ratings of Perceived Exertion 11-13   ? Perceived Dyspnea 0-4   ?  ? Progression  ? Progression Continue progressive overload as per policy without signs/symptoms or physical distress.   ?  ? Resistance Training  ? Training Prescription Yes   ? Weight 2 lbs   ? Reps 10-15   ? ?  ?  ? ?  ? ? ?Perform Capillary Blood Glucose checks as needed. ? ?Exercise Prescription Changes: ? ? ?Exercise Comments: ? ? ?Exercise Goals and Review: ? ? Exercise Goals   ? ? [place] Name 09/17/21 1336  ?  ?  ?  ?  ?  ? Exercise Goals  ? Increase Physical Activity Yes      ? Intervention Provide advice, education, support and counseling about physical activity/exercise needs.;Develop an individualized exercise prescription for aerobic and resistive training based on initial evaluation findings, risk stratification, comorbidities and participant's personal goals.      ? Expected Outcomes Short Term: Attend rehab on a regular basis to increase amount of physical activity.;Long Term: Add in home exercise to make exercise part of routine and to increase amount of physical activity.;Long Term: Exercising regularly at least 3-5 days a week.      ? Increase Strength and Stamina Yes      ? Intervention Provide advice, education, support and counseling about physical activity/exercise needs.;Develop an individualized exercise prescription for aerobic and resistive training based on initial evaluation findings, risk stratification, comorbidities and participant's personal goals.      ? Expected Outcomes Short Term: Increase workloads from initial exercise  prescription for resistance, speed, and METs.;Short Term: Perform resistance training exercises routinely during rehab and add in resistance training at home;Long Term: Improve cardiorespiratory fitness, muscula

## 2021-09-17 NOTE — Progress Notes (Signed)
Cardiac Rehab Medication Review by a Nurse ? ?Does the patient  feel that his/her medications are working for him/her?  YES  ? ?Has the patient been experiencing any side effects to the medications prescribed?  YES  ? ?Does the patient measure his/her own blood pressure or blood glucose at home?  YES ? ?Does the patient have any problems obtaining medications due to transportation or finances?    NO ?Understanding of regimen: good ?Understanding of indications: good ?Potential of compliance: fair ? ? ? ?Nurse comments: Sean Kirby says he is taking his medications as prescribed with the exception of the metoprolol. Sean Kirby said that he stopped taking the metropolol at the end of February due feeling lightheaded and fatigued. Sean Kirby says that the amiodarone is causing him a sour stomach and he has no appetite. Will notify Dr Harriet Masson.   ? ? ? ?Sean Gave RN ?09/17/2021 11:03 AM ?  ?

## 2021-09-17 NOTE — Telephone Encounter (Signed)
Spoke with Verdis Frederickson from Cardiac Rehab. She reported pt has not been taking his Metoprolol. After reviewing his chart he was directed to stop Metoprolol until her sees Dr. Harriet Masson on 4/19. Vitals today: HR 64, 134/70, 128/70. Pt has been taking other medications as directed. Will get message to Dr. Harriet Masson for review.  ?

## 2021-09-18 ENCOUNTER — Telehealth: Payer: Self-pay

## 2021-09-18 NOTE — Telephone Encounter (Signed)
Called pt to let him know Dr. Harriet Masson wants him to stop the Amiodarone since he is having symptoms from this medication. No answer at this time, left a detailed message on him voicemail.   ?

## 2021-09-19 ENCOUNTER — Ambulatory Visit (INDEPENDENT_AMBULATORY_CARE_PROVIDER_SITE_OTHER): Payer: Self-pay | Admitting: Cardiothoracic Surgery

## 2021-09-19 ENCOUNTER — Encounter: Payer: Self-pay | Admitting: Cardiothoracic Surgery

## 2021-09-19 VITALS — BP 137/70 | HR 63 | Resp 20 | Ht 68.0 in | Wt 147.0 lb

## 2021-09-19 DIAGNOSIS — Z951 Presence of aortocoronary bypass graft: Secondary | ICD-10-CM

## 2021-09-19 DIAGNOSIS — I2510 Atherosclerotic heart disease of native coronary artery without angina pectoris: Secondary | ICD-10-CM | POA: Insufficient documentation

## 2021-09-19 DIAGNOSIS — I2583 Coronary atherosclerosis due to lipid rich plaque: Secondary | ICD-10-CM

## 2021-09-19 NOTE — Progress Notes (Addendum)
?  HPI: ?Patient returns for routine postoperative follow-up having undergone coronary artery bypass grafting x4 on June 23, 2021 for acute coronary syndrome.Marland Kitchen ?The patient's early postoperative recovery while in the hospital was notable for transient atrial flutter-fibrillation which converted to sinus rhythm.  He was discharged home on amiodarone.  This has been stopped because of GI symptoms and poor appetite.  He is remained in sinus rhythm.Marland Kitchen ?Since hospital discharge the patient reports gradual increase in strength but he has regained his weight loss and still has some exercise fatigue.  He has oriented to cardiac rehab phase 2 and will begin the program next week.. ? ? ?Current Outpatient Medications  ?Medication Sig Dispense Refill  ? acetaminophen (TYLENOL) 500 MG tablet Take 1,000 mg by mouth every 6 (six) hours as needed (for pain.).    ? alclomethasone (ACLOVATE) 0.05 % cream Apply 1 application topically 2 (two) times daily as needed (skin irritation.).    ? amLODipine (NORVASC) 5 MG tablet Take 5 mg by mouth at bedtime.    ? aspirin 81 MG EC tablet Take 81 mg by mouth in the morning.    ? fexofenadine (ALLEGRA) 180 MG tablet Take 180 mg by mouth daily as needed for allergies.    ? glucose blood test strip 1 each by Other route as needed. Use as instructed    ? latanoprost (XALATAN) 0.005 % ophthalmic solution Place 1 drop into both eyes at bedtime.    ? LORazepam (ATIVAN) 0.5 MG tablet Take 0.5 mg by mouth daily as needed for anxiety.    ? meclizine (ANTIVERT) 25 MG tablet Take 1 tablet (25 mg total) by mouth 3 (three) times daily as needed for dizziness. (Patient not taking: Reported on 09/11/2021) 30 tablet 0  ? metFORMIN (GLUCOPHAGE) 850 MG tablet Take 850 mg by mouth 2 (two) times daily with a meal.    ? Multiple Vitamin (MULTIVITAMIN WITH MINERALS) TABS Take 2 tablets by mouth in the morning. gummy    ? polyethylene glycol (MIRALAX / GLYCOLAX) 17 g packet Take 17 g by mouth daily as needed  (constipation).    ? rosuvastatin (CRESTOR) 20 MG tablet Take 1 tablet (20 mg total) by mouth daily. 90 tablet 3  ? timolol (TIMOPTIC) 0.5 % ophthalmic solution Place 1 drop into both eyes in the morning.    ? ?No current facility-administered medications for this visit.  ?Blood pressure 137/70, pulse 63, resp. rate 20, height '5\' 8"'$  (1.727 m), weight 147 lb (66.7 kg), SpO2 95 %.  ? ?Physical Exam: ?Alert and comfortable ?Lungs clear ?Sternal incision well-healed ?Abdomen soft ?No ankle edema ?Neuro intact ? ?Diagnostic Tests: ?Chest x-ray today shows resolution of left pleural effusion, sternal wires intact and cardiac silhouette normal. ? ?Impression: ?Patient is now 3 months postop CABG.  His restrictions on activity and lifting are lifted.  He is encouraged to follow heart healthy diet and heart healthy lifestyle which we discussed in detail. ? ?Plan: ?Return as needed.  He has a follow-up appointment with his cardiologist Dr. Harriet Masson in couple weeks.  He knows not to take his amiodarone and to hold his metoprolol until he sees Dr. Harriet Masson ? ? ?[name], MD ?[facility] ?[phone] ? ?

## 2021-09-23 ENCOUNTER — Encounter (HOSPITAL_COMMUNITY)
Admission: RE | Admit: 2021-09-23 | Discharge: 2021-09-23 | Disposition: A | Discharge status: Home / Self Care | Payer: Medicare Other | Source: Ambulatory Visit | Attending: Cardiology | Admitting: Cardiology

## 2021-09-23 DIAGNOSIS — Z951 Presence of aortocoronary bypass graft: Secondary | ICD-10-CM

## 2021-09-23 DIAGNOSIS — Z79899 Other long term (current) drug therapy: Secondary | ICD-10-CM | POA: Diagnosis not present

## 2021-09-23 LAB — GLUCOSE, CAPILLARY
Glucose-Capillary: 154 mg/dL — ABNORMAL HIGH (ref 70–99)
Glucose-Capillary: 125 mg/dL — ABNORMAL HIGH (ref 70–99)

## 2021-09-23 NOTE — Progress Notes (Addendum)
Daily Session Note ? ?Patient Details  ?Name: Sean Kirby ?MRN: [id number] ?Date of Birth: August 15, 1955 ?Referring Provider:   ?Flowsheet Row CARDIAC REHAB PHASE II ORIENTATION from 09/17/2021 in [facility]  ?Referring Provider Dr. Harriet Kirby  ? ?  ? ? ?Encounter Date: 09/23/2021 ? ?Check In: ? Session Check In - 09/23/21 1259   ? ?  ? Check-In  ? Supervising physician immediately available to respond to emergencies [facility] immediately available   ? Physician(s) Dr. [place] Kirby   ? Location [facility]   ? Staff Present Barnet Pall, RN, Milus Glazier, MS, ACSM-CEP, CCRP, Exercise Physiologist;Olinty Celesta Aver, MS, ACSM CEP, Exercise Physiologist   ? Virtual Visit No   ? Medication changes reported     No   ? Fall or balance concerns reported    No   ? Tobacco Cessation No Change   ? Current number of cigarettes/nicotine per day     0   ? Warm-up and Cool-down Performed as group-led instruction   ? Resistance Training Performed Yes   ? VAD Patient? No   ? PAD/SET Patient? No   ?  ? Pain Assessment  ? Currently in Pain? No/denies   ? Pain Score 0-No pain   ? Multiple Pain Sites No   ? ?  ?  ? ?  ? ? ?Capillary Blood Glucose: ?Results for orders placed or performed during the hospital encounter of 09/23/21 (from the past 24 hour(s))  ?Glucose, capillary     Status: Abnormal  ? Collection Time: 09/23/21  1:38 PM  ?Result Value Ref Range  ? Glucose-Capillary 125 (H) 70 - 99 mg/dL  ? ? ? Exercise Prescription Changes - 09/23/21 1400   ? ?  ? Response to Exercise  ? Blood Pressure (Admit) 124/64   ? Blood Pressure (Exercise) 150/84   ? Blood Pressure (Exit) 110/68   ? Heart Rate (Admit) 69 bpm   ? Heart Rate (Exercise) 87 bpm   ? Heart Rate (Exit) 69 bpm   ? Rating of Perceived Exertion (Exercise) 11   ? Symptoms None   ? Comments Pt's first day in the CRP2 program   ? Duration Continue with 30 min of aerobic exercise without signs/symptoms of physical distress.   ?  Intensity THRR unchanged   ?  ? Progression  ? Progression Continue to progress workloads to maintain intensity without signs/symptoms of physical distress.   ? Average METs 2.2   ?  ? Resistance Training  ? Training Prescription Yes   ? Weight 2 lbs   ? Reps 10-15   ? Time 10 Minutes   ?  ? Interval Training  ? Interval Training No   ?  ? NuStep  ? Level 1   ? Minutes 26   ? METs 2.2   ? ?  ?  ? ?  ? ? ?Social History  ? ?Tobacco Use  ?Smoking Status Never  ?Smokeless Tobacco Never  ? ? ?Goals Met:  ?Exercise tolerated well ?No report of concerns or symptoms today ?Strength training completed today ? ?Goals Unmet:  ?Not Applicable ? ?Comments: Sean Kirby started cardiac rehab today.  Pt tolerated light exercise without difficulty. VSS, telemetry-Sinus rhythm, asymptomatic. Sean Kirby is somewhat deconditioned. Medication list reconciled. Pt denies barriers to medicaiton compliance.  PSYCHOSOCIAL ASSESSMENT:  PHQ-4. Sean Kirby admits to experiencing some anxiety and has antianxiety medication to take as needed. Pt has hopeful outlook with supportive family. No psychosocial needs identified at this  time, no psychosocial interventions necessary.    Pt enjoys yard work.   Pt oriented to exercise equipment and routine.    Understanding verbalized. Barnet Pall, RN,BSN ?09/23/2021 5:09 PM  ? ? ?Dr. Fransico Kirby is Medical Director for Cardiac Rehab at [facility] ?

## 2021-09-23 NOTE — Progress Notes (Signed)
Cardiac Individual Treatment Plan ? ?Patient Details  ?Name: Sean Kirby ?MRN: [id number] ?Date of Birth: 07/23/1955 ?Referring Provider:   ?Flowsheet Row CARDIAC REHAB PHASE II ORIENTATION from 09/17/2021 in [facility]  ?Referring Provider Dr. Harriet Masson  ? ?  ? ? ?Initial Encounter Date:  ?Flowsheet Row CARDIAC REHAB PHASE II ORIENTATION from 09/17/2021 in [facility]  ?Date 09/17/21  ? ?  ? ? ?Visit Diagnosis: 06/25/21 S/P CABG x 4 ? ?Patient's Home Medications on Admission: ? ?Current Outpatient Medications:  ?  acetaminophen (TYLENOL) 500 MG tablet, Take 1,000 mg by mouth every 6 (six) hours as needed (for pain.)., Disp: , Rfl:  ?  alclomethasone (ACLOVATE) 0.05 % cream, Apply 1 application topically 2 (two) times daily as needed (skin irritation.)., Disp: , Rfl:  ?  amLODipine (NORVASC) 5 MG tablet, Take 5 mg by mouth at bedtime., Disp: , Rfl:  ?  aspirin 81 MG EC tablet, Take 81 mg by mouth in the morning., Disp: , Rfl:  ?  fexofenadine (ALLEGRA) 180 MG tablet, Take 180 mg by mouth daily as needed for allergies., Disp: , Rfl:  ?  glucose blood test strip, 1 each by Other route as needed. Use as instructed, Disp: , Rfl:  ?  latanoprost (XALATAN) 0.005 % ophthalmic solution, Place 1 drop into both eyes at bedtime., Disp: , Rfl:  ?  LORazepam (ATIVAN) 0.5 MG tablet, Take 0.5 mg by mouth daily as needed for anxiety., Disp: , Rfl:  ?  meclizine (ANTIVERT) 25 MG tablet, Take 1 tablet (25 mg total) by mouth 3 (three) times daily as needed for dizziness. (Patient not taking: Reported on 09/11/2021), Disp: 30 tablet, Rfl: 0 ?  metFORMIN (GLUCOPHAGE) 850 MG tablet, Take 850 mg by mouth 2 (two) times daily with a meal., Disp: , Rfl:  ?  Multiple Vitamin (MULTIVITAMIN WITH MINERALS) TABS, Take 2 tablets by mouth in the morning. gummy, Disp: , Rfl:  ?  polyethylene glycol (MIRALAX / GLYCOLAX) 17 g packet, Take 17 g by mouth daily as needed (constipation)., Disp: ,  Rfl:  ?  rosuvastatin (CRESTOR) 20 MG tablet, Take 1 tablet (20 mg total) by mouth daily., Disp: 90 tablet, Rfl: 3 ?  timolol (TIMOPTIC) 0.5 % ophthalmic solution, Place 1 drop into both eyes in the morning., Disp: , Rfl:  ? ?Past Medical History: ?Past Medical History:  ?Diagnosis Date  ? Anxiety   ? Arthritis   ? Basal cell carcinoma (BCC) of cheek 2021  ? Coronary artery disease 2022  ? Depression   ? Diabetes mellitus   ? GERD (gastroesophageal reflux disease)   ? Glaucoma   ? Hyperlipidemia   ? Hypertension   ? Hypoglycemia   ? Neuromuscular disorder ([place])   ? neuropathy feet  ? ? ?Tobacco Use: ?Social History  ? ?Tobacco Use  ?Smoking Status Never  ?Smokeless Tobacco Never  ? ? ?Labs: ?Review Flowsheet   ? ?  ?  Latest Ref Rng & Units 06/21/2021 06/25/2021 06/26/2021  ?Labs for ITP Cardiac and Pulmonary Rehab  ?Hemoglobin A1c 4.8 - 5.6 % 6.9      ?PH, Arterial 7.350 - 7.450 7.434   7.319    ? 7.352    ? 7.344    ? 7.387    ? 7.429    ? 7.508    ? 7.497    ? 7.469    ? 7.450    ? 7.417    ?  7.417   7.386    ?PCO2 arterial 32.0 - 48.0 mmHg 39.1   38.2    ? 39.8    ? 44.9    ? 37.3    ? 36.6    ? 31.6    ? 34.8    ? 37.3    ? 39.1    ? 43.1    ? 45.2   36.8    ?Bicarbonate 20.0 - 28.0 mmol/L 25.7   19.7    ? 22.3    ? 24.7    ? 22.4    ? 24.2    ? 25.1    ? 27.0    ? 27.1    ? 27.1    ? 26.1    ? 27.7    ? 29.1   22.0    ?TCO2 22 - 32 mmol/L  21    ? 24    ? 26    ? 23    ? 23    ? 25    ? 24    ? 26    ? 25    ? 28    ? 26    ? 28    ? 28    ? 27    ? 27    ? 29    ? 29    ? 30    ? 28   23    ?Acid-base deficit 0.0 - 2.0 mmol/L  6.0    ? 3.0    ? 1.0    ? 2.0   3.0    ?O2 Saturation % 98.0   97.0    ? 98.0    ? 95.0    ? 99.0    ? 100.0    ? 100.0    ? 100.0    ? 100.0    ? 100.0    ? 85.0    ? 100.0    ? 100.0   98.0    ?  ? ? Multiple values from one day are sorted in reverse-chronological order  ?  ?  ? ? ?Capillary Blood Glucose: ?Lab Results  ?Component Value Date  ? GLUCAP 125 (H) 09/23/2021  ? GLUCAP 154 (H)  09/23/2021  ? GLUCAP 115 (H) 07/02/2021  ? GLUCAP 158 (H) 07/01/2021  ? GLUCAP 115 (H) 07/01/2021  ? ? ? ?Exercise Target Goals: ?Exercise Program Goal: ?Individual exercise prescription set using results from initial 6 min walk test and THRR while considering  patient?s activity barriers and safety.  ? ?Exercise Prescription Goal: ?Starting with aerobic activity 30 plus minutes a day, 3 days per week for initial exercise prescription. Provide home exercise prescription and guidelines that participant acknowledges understanding prior to discharge. ? ?Activity Barriers & Risk Stratification: ? Activity Barriers & Cardiac Risk Stratification - 09/17/21 1336   ? ?  ? Activity Barriers & Cardiac Risk Stratification  ? Activity Barriers Back Problems;Deconditioning;Muscular Weakness;Balance Concerns   ? Cardiac Risk Stratification High   ? ?  ?  ? ?  ? ? ?6 Minute Walk: ? 6 Minute Walk   ? ? [place] Name 09/17/21 1334  ?  ?  ?  ? 6 Minute Walk  ? Phase Initial    ? Distance 1065 feet    ? Walk Time 6 minutes    ? # of Rest Breaks 0    ? MPH 2.02    ? METS 2.79    ? RPE 9    ?  Perceived Dyspnea  0    ? VO2 Peak 9.76    ? Symptoms No    ? Resting HR 64 bpm    ? Resting BP 137/70    ? Resting Oxygen Saturation  98 %    ? Exercise Oxygen Saturation  during 6 min walk 98 %    ? Max Ex. HR 72 bpm    ? Max Ex. BP 138/70    ? 2 Minute Post BP 130/64    ? ?  ?  ? ?  ? ? ?Oxygen Initial Assessment: ? ? ?Oxygen Re-Evaluation: ? ? ?Oxygen Discharge (Final Oxygen Re-Evaluation): ? ? ?Initial Exercise Prescription: ? Initial Exercise Prescription - 09/17/21 1300   ? ?  ? Date of Initial Exercise RX and Referring Provider  ? Date 09/17/21   ? Referring Provider Dr. Harriet Masson   ? Expected Discharge Date 11/15/21   ?  ? NuStep  ? Level 1   ? SPM 75   ? Minutes 30   ? METs 2   ?  ? Prescription Details  ? Frequency (times per week) 3   ? Duration Progress to 30 minutes of continuous aerobic without signs/symptoms of physical distress   ?  ?  Intensity  ? THRR 40-80% of Max Heartrate 61-122   ? Ratings of Perceived Exertion 11-13   ? Perceived Dyspnea 0-4   ?  ? Progression  ? Progression Continue progressive overload as per policy without signs/symptoms or physical distress.   ?  ? Resistance Training  ? Training Prescription Yes   ? Weight 2 lbs   ? Reps 10-15   ? ?  ?  ? ?  ? ? ?Perform Capillary Blood Glucose checks as needed. ? ?Exercise Prescription Changes: ? ? Exercise Prescription Changes   ? ? [place] Name 09/23/21 1400  ?  ?  ?  ?  ?  ? Response to Exercise  ? Blood Pressure (Admit) 124/64      ? Blood Pressure (Exercise) 150/84      ? Blood Pressure (Exit) 110/68      ? Heart Rate (Admit) 69 bpm      ? Heart Rate (Exercise) 87 bpm      ? Heart Rate (Exit) 69 bpm      ? Rating of Perceived Exertion (Exercise) 11      ? Symptoms None      ? Comments Pt's first day in the CRP2 program      ? Duration Continue with 30 min of aerobic exercise without signs/symptoms of physical distress.      ? Intensity THRR unchanged      ?  ? Progression  ? Progression Continue to progress workloads to maintain intensity without signs/symptoms of physical distress.      ? Average METs 2.2      ?  ? Resistance Training  ? Training Prescription Yes      ? Weight 2 lbs      ? Reps 10-15      ? Time 10 Minutes      ?  ? Interval Training  ? Interval Training No      ?  ? NuStep  ? Level 1      ? Minutes 26      ? METs 2.2      ? ?  ?  ? ?  ? ? ?Exercise Comments: ? ? Exercise Comments   ? ? [place] Name 09/23/21 1434  ?  ?  ?  ?  ?  Exercise Comments Pt's first day in the CRP2 program. Pt had no complaints with todays session.      ? ?  ?  ? ?  ? ? ?Exercise Goals and Review: ? ? Exercise Goals   ? ? [place] Name 09/17/21 1336  ?  ?  ?  ?  ?  ? Exercise Goals  ? Increase Physical Activity Yes      ? Intervention Provide advice, education, support and counseling about physical activity/exercise needs.;Develop an individualized exercise prescription for aerobic and resistive  training based on initial evaluation findings, risk stratification, comorbidities and participant's personal goals.      ? Expected Outcomes Short Term: Attend rehab on a regular basis to increase amount of phy

## 2021-09-25 ENCOUNTER — Encounter (HOSPITAL_COMMUNITY)
Admission: RE | Admit: 2021-09-25 | Discharge: 2021-09-25 | Disposition: A | Discharge status: Home / Self Care | Payer: Medicare Other | Source: Ambulatory Visit | Attending: Cardiology | Admitting: Cardiology

## 2021-09-25 VITALS — Ht 68.0 in | Wt 144.0 lb

## 2021-09-25 DIAGNOSIS — Z951 Presence of aortocoronary bypass graft: Secondary | ICD-10-CM

## 2021-09-25 LAB — GLUCOSE, CAPILLARY
Glucose-Capillary: 156 mg/dL — ABNORMAL HIGH (ref 70–99)
Glucose-Capillary: 117 mg/dL — ABNORMAL HIGH (ref 70–99)

## 2021-09-25 NOTE — Progress Notes (Signed)
Sean Kirby 66 y.o. male ?Nutrition Note ?Sean Kirby is motivated to make lifestyle changes to aid with cardiac rehab. Patient has medical history of CABGx4, CAD, HTN, DM2, hyperlipidemia. He lives at home alone, he does all of his own grocery shopping and cooking. He reports concerns of weight loss (30#) and decreased appetite. He does not like fish. He reports that he has some Boost at home. ? ?Labs: A1c 6.9 ? ?Protein: 78g-91g/day (1.2-1.4g/kg actual body weight)  ? ?24 hour recall: ?Breakfast: cheerios with raisins ?Lunch: small pimento cheese sandwich, fritos, Coke Zero ?Dinner: filet, mashed potatoes (milk, butter) ? ?Nutrition Diagnosis ?Unintentional wt loss related to decreased food intake as evidenced by wt loss of 10% in 12 month, BMI of 21.8 ?Excessive carbohydrate intake related to consumption of convenience foods and sugary beverages as evidenced by A1C 6.9 ? ?Nutrition Intervention ?Pt?s individual nutrition plan reviewed with pt. ?Benefits of adopting Heart Healthy diet discussed.  ?Continue client-centered nutrition education by RD, as part of interdisciplinary care. ? ?Monitor/Evaluation: ?Patient reports motivation to make lifestyle changes for adherence to heart healthy diet recommendation, blood sugar control, and weight management. We discussed heart healthy protein sources and needs and implementing high quality snacks of protein + carbohydrate to aid with weight maintenance/gain and blood sugar control. Handouts/notes given. Patient amicable to RD suggestions and verbalizes understanding. Will follow-up to further discuss heart healthy food choices.   ? ?12 minutes spent in review of topics related to a heart healthy diet including sodium intake, blood sugar control, weight management, and fiber intake. ? ?Goal(s) ?Prioritize lean protein/plant sources at all meals and snacks to support lean muscle mass, blood sugar, and weight. ?Implement 2 snacks per day of protein + complex carbohydrate  OR protein supplement/Boost to support weight maintenance/gain, blood sugar control, meeting nutrition needs.  ? ?Pt to identify and limit food sources of saturated fat, trans fat, refined carbohydrates and sodium ?Pt able to name foods that affect blood glucose. Continue to limit simple sugars, refined carbohydrates, sugary beverages, etc.  ?Pt to describe the benefit of including lean protein/plant proteins, fruits, vegetables, whole grains, nuts/seeds, and low-fat dairy products in a heart healthy meal plan. ? ?Plan:  ?Pt to attend nutrition classes  ?Will provide client-centered nutrition education as part of interdisciplinary care ?Monitor and evaluate progress toward nutrition goal with team. ? ? ?Sean [place], MS, RDN, LDN ? ?

## 2021-09-27 ENCOUNTER — Encounter (HOSPITAL_COMMUNITY)
Admission: RE | Admit: 2021-09-27 | Discharge: 2021-09-27 | Disposition: A | Discharge status: Home / Self Care | Payer: Medicare Other | Source: Ambulatory Visit | Attending: Cardiology | Admitting: Cardiology

## 2021-09-27 DIAGNOSIS — Z951 Presence of aortocoronary bypass graft: Secondary | ICD-10-CM | POA: Diagnosis not present

## 2021-09-30 ENCOUNTER — Encounter (HOSPITAL_COMMUNITY)
Admission: RE | Admit: 2021-09-30 | Discharge: 2021-09-30 | Disposition: A | Discharge status: Home / Self Care | Payer: Medicare Other | Source: Ambulatory Visit | Attending: Cardiology | Admitting: Cardiology

## 2021-09-30 DIAGNOSIS — Z951 Presence of aortocoronary bypass graft: Secondary | ICD-10-CM | POA: Diagnosis not present

## 2021-10-02 ENCOUNTER — Encounter (HOSPITAL_COMMUNITY)
Admission: RE | Admit: 2021-10-02 | Discharge: 2021-10-02 | Disposition: A | Discharge status: Home / Self Care | Payer: Medicare Other | Source: Ambulatory Visit | Attending: Cardiology | Admitting: Cardiology

## 2021-10-02 ENCOUNTER — Ambulatory Visit (INDEPENDENT_AMBULATORY_CARE_PROVIDER_SITE_OTHER): Payer: Medicare Other | Admitting: Cardiology

## 2021-10-02 ENCOUNTER — Encounter: Payer: Self-pay | Admitting: Cardiology

## 2021-10-02 VITALS — BP 140/76 | HR 68 | Ht 68.0 in | Wt 147.2 lb

## 2021-10-02 DIAGNOSIS — I2510 Atherosclerotic heart disease of native coronary artery without angina pectoris: Secondary | ICD-10-CM

## 2021-10-02 DIAGNOSIS — I480 Paroxysmal atrial fibrillation: Secondary | ICD-10-CM

## 2021-10-02 DIAGNOSIS — E1100 Type 2 diabetes mellitus with hyperosmolarity without nonketotic hyperglycemic-hyperosmolar coma (NKHHC): Secondary | ICD-10-CM

## 2021-10-02 DIAGNOSIS — E782 Mixed hyperlipidemia: Secondary | ICD-10-CM | POA: Diagnosis not present

## 2021-10-02 DIAGNOSIS — Z951 Presence of aortocoronary bypass graft: Secondary | ICD-10-CM

## 2021-10-02 DIAGNOSIS — I10 Essential (primary) hypertension: Secondary | ICD-10-CM

## 2021-10-02 NOTE — Patient Instructions (Signed)
Medication Instructions:  ?Your physician recommends that you continue on your current medications as directed. Please refer to the Current Medication list given to you today.   ?*If you need a refill on your cardiac medications before your next appointment, please call your pharmacy* ? ? ?Lab Work: ?None ?If you have labs (blood work) drawn today and your tests are completely normal, you will receive your results only by: ?MyChart Message (if you have MyChart) OR ?A paper copy in the mail ?If you have any lab test that is abnormal or we need to change your treatment, we will call you to review the results. ? ? ?Testing/Procedures: ?None ? ? ?Follow-Up: ?At [facility], you and your health needs are our priority.  As part of our continuing mission to provide you with exceptional heart care, we have created designated Provider Care Teams.  These Care Teams include your primary Cardiologist (physician) and Advanced Practice Providers (APPs -  Physician Assistants and Nurse Practitioners) who all work together to provide you with the care you need, when you need it. ? ?We recommend signing up for the patient portal called "MyChart".  Sign up information is provided on this After Visit Summary.  MyChart is used to connect with patients for Virtual Visits (Telemedicine).  Patients are able to view lab/test results, encounter notes, upcoming appointments, etc.  Non-urgent messages can be sent to your provider as well.   ?To learn more about what you can do with MyChart, go to [email or web address].   ? ?Your next appointment:   ?4 month(s) ? ?The format for your next appointment:   ?In Person ? ?Provider:   ?Berniece Salines, DO   ? ? ?Other Instructions ? ? ?Important Information About Sugar ? ? ? ? ?  ?

## 2021-10-02 NOTE — Progress Notes (Signed)
?Cardiology Office Note:   ? ?Date:  10/04/2021  ? ?ID:  Sean Kirby, DOB 12-07-1955, MRN [id number] ? ?PCP:  Shirline Frees, MD  ?Cardiologist:  Berniece Salines, DO  ?Electrophysiologist:  None  ? ?Referring MD: Shirline Frees, MD  ? ?" I am doing well" ? ?History of Present Illness:   ? ?Sean Kirby is a 66 y.o. male with a hx of coronary artery disease status post CABG x4 with LIMA to the LAD, saphenous vein graft to the ramus intermedius, saphenous vein graft to the circumflex marginal, saphenous vein graft to the posterior descending arteries on June 25, 2021, diabetes mellitus type 2, mixed hyperlipidemia, depression, GERD. ?  ?I first saw the patient on May 27, 2021 at that time he was experiencing intermittent chest discomfort and palpitations.  I recommended a coronary CTA to the patient given his risk factors.  He was agreeable.  He was hypertensive in the office that day but he gave me his blood pressure information from home which was lower than his presentation.  Therefore no antihypertensive medication was adjusted. ?  ?I saw the patient on June 14, 2019 to discuss his coronary CT scan which showed severe multivessel coronary artery disease and the need for left heart catheterization.  He proceeded with a left heart catheterization which was done on June 18, 2021 by Dr. Tamala Julian at that time given the severity of his disease it was requested that he he be seen by CT surgery.  He was evaluated by CT surgery and underwent coronary artery bypass grafting on June 25, 2021. He was discharged to home on July 02, 2021.  He is here with his sister today.  His postoperative course was complicated by development of atrial flutter which did not spontaneously convert he was placed on amiodarone and eventually converted back to sinus rhythm.  They both tell me that he has had good home visits with the home health nurses. ? ?I saw the patient on 07/17/2021 at that time he was here with his  sister. He complained of leg pain and edema. I ordered arterial doppler for the patient which was normal. Since his visit he has been titrated off the beta blocker.  ? ?Today he tells me he is experiencing some leg pain. He is still in participating cardiac rehab. NO other complains at this time.  ? ?Past Medical History:  ?Diagnosis Date  ? Anxiety   ? Arthritis   ? Basal cell carcinoma (BCC) of cheek 2021  ? Coronary artery disease 2022  ? Depression   ? Diabetes mellitus   ? GERD (gastroesophageal reflux disease)   ? Glaucoma   ? Hyperlipidemia   ? Hypertension   ? Hypoglycemia   ? Neuromuscular disorder ([place])   ? neuropathy feet  ? ? ?Past Surgical History:  ?Procedure Laterality Date  ? CARDIAC CATHETERIZATION    ? CORONARY ARTERY BYPASS GRAFT N/A 06/25/2021  ? Procedure: CORONARY ARTERY BYPASS GRAFTING (CABG) X4 USING LEFT INTERNAL MAMMARY ARTERY AND ENDOSCOPICALLY HARVESTED RIGHT GREATER SAPHENOUS VEIN;  Surgeon: [name], MD;  Location: [facility];  Service: Open Heart Surgery;  Laterality: N/A;  ? ENDOVEIN HARVEST OF GREATER SAPHENOUS VEIN Right 06/25/2021  ? Procedure: ENDOVEIN HARVEST OF GREATER SAPHENOUS VEIN;  Surgeon: [name], MD;  Location: [facility];  Service: Open Heart Surgery;  Laterality: Right;  ? GLAUCOMA REPAIR  06/16/2004  ? lazer-low angle -both  ? HERNIA REPAIR Right 2018  ? inguinal  ? INGUINAL HERNIA REPAIR  12/02/2011  ? Procedure: HERNIA REPAIR INGUINAL ADULT;  Surgeon: [name], MD;  Location: [facility];  Service: General;  Laterality: Left;  left inguinal  ? LEFT HEART CATH AND CORONARY ANGIOGRAPHY N/A 06/18/2021  ? Procedure: LEFT HEART CATH AND CORONARY ANGIOGRAPHY;  Surgeon: [name], MD;  Location: [facility];  Service: Cardiovascular;  Laterality: N/A;  ? SIGMOIDOSCOPY    ? TONSILLECTOMY AND ADENOIDECTOMY    ? ? ?Current Medications: ?Current Meds  ?Medication Sig  ? acetaminophen (TYLENOL) 500 MG tablet Take 1,000 mg by mouth every 6  (six) hours as needed (for pain.).  ? alclomethasone (ACLOVATE) 0.05 % cream Apply 1 application topically 2 (two) times daily as needed (skin irritation.).  ? amLODipine (NORVASC) 5 MG tablet Take 5 mg by mouth at bedtime.  ? aspirin 81 MG EC tablet Take 81 mg by mouth in the morning.  ? fexofenadine (ALLEGRA) 180 MG tablet Take 180 mg by mouth daily as needed for allergies.  ? glucose blood test strip 1 each by Other route as needed. Use as instructed  ? latanoprost (XALATAN) 0.005 % ophthalmic solution Place 1 drop into both eyes at bedtime.  ? LORazepam (ATIVAN) 0.5 MG tablet Take 0.5 mg by mouth daily as needed for anxiety.  ? metFORMIN (GLUCOPHAGE) 850 MG tablet Take 850 mg by mouth 2 (two) times daily with a meal.  ? Multiple Vitamin (MULTIVITAMIN WITH MINERALS) TABS Take 2 tablets by mouth in the morning. gummy  ? polyethylene glycol (MIRALAX / GLYCOLAX) 17 g packet Take 17 g by mouth daily as needed (constipation).  ? rosuvastatin (CRESTOR) 20 MG tablet Take 1 tablet (20 mg total) by mouth daily.  ? timolol (TIMOPTIC) 0.5 % ophthalmic solution Place 1 drop into both eyes in the morning.  ?  ? ?Allergies:   Codeine, Septra [sulfamethoxazole-trimethoprim], Dapagliflozin, Sertraline hcl, Glimepiride, and Simvastatin  ? ?Social History  ? ?Socioeconomic History  ? Marital status: Single  ?  Spouse name: Not on file  ? Number of children: 0  ? Years of education: Not on file  ? Highest education level: Not on file  ?Occupational History  ? Occupation: retired  ?Tobacco Use  ? Smoking status: Never  ? Smokeless tobacco: Never  ?Vaping Use  ? Vaping Use: Never used  ?Substance and Sexual Activity  ? Alcohol use: No  ? Drug use: No  ? Sexual activity: Not Currently  ?Other Topics Concern  ? Not on file  ?Social History Narrative  ? Right Handed  ? Lives in a one story home  ? Does not drink caffeine  ? ?Social Determinants of Health  ? ?Financial Resource Strain: Low Risk   ? Difficulty of Paying Living Expenses:  Not very hard  ?Food Insecurity: No Food Insecurity  ? Worried About Charity fundraiser in the Last Year: Never true  ? Ran Out of Food in the Last Year: Never true  ?Transportation Needs: No Transportation Needs  ? Lack of Transportation (Medical): No  ? Lack of Transportation (Non-Medical): No  ?Physical Activity: Not on file  ?Stress: Not on file  ?Social Connections: Not on file  ?  ? ?Family History: ?The patient's family history includes Heart disease in his maternal aunt, maternal grandmother, and mother. There is no history of Colon cancer. ? ?ROS:   ?Review of Systems  ?Constitution: Negative for decreased appetite, fever and weight gain.  ?HENT: Negative for congestion, ear discharge, hoarse voice and  sore throat.   ?Eyes: Negative for discharge, redness, vision loss in right eye and visual halos.  ?Cardiovascular: Negative for chest pain, dyspnea on exertion, leg swelling, orthopnea and palpitations.  ?Respiratory: Negative for cough, hemoptysis, shortness of breath and snoring.   ?Endocrine: Negative for heat intolerance and polyphagia.  ?Hematologic/Lymphatic: Negative for bleeding problem. Does not bruise/bleed easily.  ?Skin: Negative for flushing, nail changes, rash and suspicious lesions.  ?Musculoskeletal: Negative for arthritis, joint pain, muscle cramps, myalgias, neck pain and stiffness.  ?Gastrointestinal: Negative for abdominal pain, bowel incontinence, diarrhea and excessive appetite.  ?Genitourinary: Negative for decreased libido, genital sores and incomplete emptying.  ?Neurological: Negative for brief paralysis, focal weakness, headaches and loss of balance.  ?Psychiatric/Behavioral: Negative for altered mental status, depression and suicidal ideas.  ?Allergic/Immunologic: Negative for HIV exposure and persistent infections.  ? ? ?EKGs/Labs/Other Studies Reviewed:   ? ?The following studies were reviewed today: ? ? ?EKG:  None  ? ?TTE 06/18/2021 ?IMPRESSIONS  ? ? ? 1. Left ventricular  ejection fraction, by estimation, is 55 to 60%. The  ?left ventricle has normal function. The left ventricle has no regional  ?wall motion abnormalities. Left ventricular diastolic parameters are  ?consisten

## 2021-10-04 ENCOUNTER — Encounter (HOSPITAL_COMMUNITY)
Admission: RE | Admit: 2021-10-04 | Discharge: 2021-10-04 | Disposition: A | Discharge status: Home / Self Care | Payer: Medicare Other | Source: Ambulatory Visit | Attending: Cardiology | Admitting: Cardiology

## 2021-10-04 DIAGNOSIS — Z951 Presence of aortocoronary bypass graft: Secondary | ICD-10-CM

## 2021-10-07 ENCOUNTER — Encounter (HOSPITAL_COMMUNITY)
Admission: RE | Admit: 2021-10-07 | Discharge: 2021-10-07 | Disposition: A | Discharge status: Home / Self Care | Payer: Medicare Other | Source: Ambulatory Visit | Attending: Cardiology | Admitting: Cardiology

## 2021-10-07 DIAGNOSIS — Z951 Presence of aortocoronary bypass graft: Secondary | ICD-10-CM

## 2021-10-09 ENCOUNTER — Encounter (HOSPITAL_COMMUNITY)
Admission: RE | Admit: 2021-10-09 | Discharge: 2021-10-09 | Disposition: A | Discharge status: Home / Self Care | Payer: Medicare Other | Source: Ambulatory Visit | Attending: Cardiology | Admitting: Cardiology

## 2021-10-09 DIAGNOSIS — Z951 Presence of aortocoronary bypass graft: Secondary | ICD-10-CM | POA: Diagnosis not present

## 2021-10-11 ENCOUNTER — Encounter (HOSPITAL_COMMUNITY)
Admission: RE | Admit: 2021-10-11 | Discharge: 2021-10-11 | Disposition: A | Discharge status: Home / Self Care | Payer: Medicare Other | Source: Ambulatory Visit | Attending: Cardiology | Admitting: Cardiology

## 2021-10-11 DIAGNOSIS — Z951 Presence of aortocoronary bypass graft: Secondary | ICD-10-CM

## 2021-10-14 ENCOUNTER — Encounter (HOSPITAL_COMMUNITY)
Admission: RE | Admit: 2021-10-14 | Discharge: 2021-10-14 | Disposition: A | Discharge status: Home / Self Care | Payer: Medicare Other | Source: Ambulatory Visit | Attending: Cardiology | Admitting: Cardiology

## 2021-10-14 DIAGNOSIS — Z951 Presence of aortocoronary bypass graft: Secondary | ICD-10-CM | POA: Insufficient documentation

## 2021-10-14 DIAGNOSIS — Z48812 Encounter for surgical aftercare following surgery on the circulatory system: Secondary | ICD-10-CM | POA: Insufficient documentation

## 2021-10-14 NOTE — Progress Notes (Signed)
Reviewed home exercise Rx with patient today.  Encouraged warm-up, cool-down, and stretching. Reviewed THRR of 61 - 122 and keeping RPE between 11-13. Encouraged to hydrate with activity.  ?Reviewed weather parameters for temperature and humidity for safe exercise outdoors. Reviewed S/S to terminate exercise and when to call 911 vs MD. Pt encouraged to always carry a cell phone for safety when exercising outdoors.Pt also encouraged to know blood sugar before exercise and carry a rapid acting glucose snack.  Pt verbalized understanding of the home exercise Rx and was provided a copy.  ? ?Lesly Rubenstein MS, ACSM-CEP, CCRP  ?

## 2021-10-16 ENCOUNTER — Encounter (HOSPITAL_COMMUNITY)
Admission: RE | Admit: 2021-10-16 | Discharge: 2021-10-16 | Disposition: A | Discharge status: Home / Self Care | Payer: Medicare Other | Source: Ambulatory Visit | Attending: Cardiology | Admitting: Cardiology

## 2021-10-16 DIAGNOSIS — Z48812 Encounter for surgical aftercare following surgery on the circulatory system: Secondary | ICD-10-CM | POA: Diagnosis not present

## 2021-10-16 DIAGNOSIS — Z951 Presence of aortocoronary bypass graft: Secondary | ICD-10-CM

## 2021-10-18 ENCOUNTER — Encounter (HOSPITAL_COMMUNITY)
Admission: RE | Admit: 2021-10-18 | Discharge: 2021-10-18 | Disposition: A | Discharge status: Home / Self Care | Payer: Medicare Other | Source: Ambulatory Visit | Attending: Cardiology | Admitting: Cardiology

## 2021-10-18 DIAGNOSIS — Z951 Presence of aortocoronary bypass graft: Secondary | ICD-10-CM

## 2021-10-21 ENCOUNTER — Encounter (HOSPITAL_COMMUNITY)
Admission: RE | Admit: 2021-10-21 | Discharge: 2021-10-21 | Disposition: A | Discharge status: Home / Self Care | Payer: Medicare Other | Source: Ambulatory Visit | Attending: Cardiology | Admitting: Cardiology

## 2021-10-21 DIAGNOSIS — Z48812 Encounter for surgical aftercare following surgery on the circulatory system: Secondary | ICD-10-CM | POA: Diagnosis not present

## 2021-10-21 DIAGNOSIS — Z951 Presence of aortocoronary bypass graft: Secondary | ICD-10-CM

## 2021-10-22 NOTE — Progress Notes (Signed)
Cardiac Individual Treatment Plan ? ?Patient Details  ?Name: Sean Kirby ?MRN: [id number] ?Date of Birth: Jan 15, 1956 ?Referring Provider:   ?Flowsheet Row CARDIAC REHAB PHASE II ORIENTATION from 09/17/2021 in [facility]  ?Referring Provider Dr. Harriet Masson  ? ?  ? ? ?Initial Encounter Date:  ?Flowsheet Row CARDIAC REHAB PHASE II ORIENTATION from 09/17/2021 in [facility]  ?Date 09/17/21  ? ?  ? ? ?Visit Diagnosis: 06/25/21 S/P CABG x 4 ? ?Patient's Home Medications on Admission: ? ?Current Outpatient Medications:  ?  acetaminophen (TYLENOL) 500 MG tablet, Take 1,000 mg by mouth every 6 (six) hours as needed (for pain.)., Disp: , Rfl:  ?  alclomethasone (ACLOVATE) 0.05 % cream, Apply 1 application topically 2 (two) times daily as needed (skin irritation.)., Disp: , Rfl:  ?  amLODipine (NORVASC) 5 MG tablet, Take 5 mg by mouth at bedtime., Disp: , Rfl:  ?  aspirin 81 MG EC tablet, Take 81 mg by mouth in the morning., Disp: , Rfl:  ?  fexofenadine (ALLEGRA) 180 MG tablet, Take 180 mg by mouth daily as needed for allergies., Disp: , Rfl:  ?  glucose blood test strip, 1 each by Other route as needed. Use as instructed, Disp: , Rfl:  ?  latanoprost (XALATAN) 0.005 % ophthalmic solution, Place 1 drop into both eyes at bedtime., Disp: , Rfl:  ?  LORazepam (ATIVAN) 0.5 MG tablet, Take 0.5 mg by mouth daily as needed for anxiety., Disp: , Rfl:  ?  meclizine (ANTIVERT) 25 MG tablet, Take 1 tablet (25 mg total) by mouth 3 (three) times daily as needed for dizziness., Disp: 30 tablet, Rfl: 0 ?  metFORMIN (GLUCOPHAGE) 850 MG tablet, Take 850 mg by mouth 2 (two) times daily with a meal., Disp: , Rfl:  ?  Multiple Vitamin (MULTIVITAMIN WITH MINERALS) TABS, Take 2 tablets by mouth in the morning. gummy, Disp: , Rfl:  ?  polyethylene glycol (MIRALAX / GLYCOLAX) 17 g packet, Take 17 g by mouth daily as needed (constipation)., Disp: , Rfl:  ?  rosuvastatin (CRESTOR) 20 MG  tablet, Take 1 tablet (20 mg total) by mouth daily., Disp: 90 tablet, Rfl: 3 ?  timolol (TIMOPTIC) 0.5 % ophthalmic solution, Place 1 drop into both eyes in the morning., Disp: , Rfl:  ? ?Past Medical History: ?Past Medical History:  ?Diagnosis Date  ? Anxiety   ? Arthritis   ? Basal cell carcinoma (BCC) of cheek 2021  ? Coronary artery disease 2022  ? Depression   ? Diabetes mellitus   ? GERD (gastroesophageal reflux disease)   ? Glaucoma   ? Hyperlipidemia   ? Hypertension   ? Hypoglycemia   ? Neuromuscular disorder ([place])   ? neuropathy feet  ? ? ?Tobacco Use: ?Social History  ? ?Tobacco Use  ?Smoking Status Never  ?Smokeless Tobacco Never  ? ? ?Labs: ?Review Flowsheet   ? ?  ?  Latest Ref Rng & Units 06/21/2021 06/25/2021 06/26/2021  ?Labs for ITP Cardiac and Pulmonary Rehab  ?Hemoglobin A1c 4.8 - 5.6 % 6.9      ?PH, Arterial 7.350 - 7.450 7.434   7.319    ? 7.352    ? 7.344    ? 7.387    ? 7.429    ? 7.508    ? 7.497    ? 7.469    ? 7.450    ? 7.417    ? 7.417   7.386    ?  PCO2 arterial 32.0 - 48.0 mmHg 39.1   38.2    ? 39.8    ? 44.9    ? 37.3    ? 36.6    ? 31.6    ? 34.8    ? 37.3    ? 39.1    ? 43.1    ? 45.2   36.8    ?Bicarbonate 20.0 - 28.0 mmol/L 25.7   19.7    ? 22.3    ? 24.7    ? 22.4    ? 24.2    ? 25.1    ? 27.0    ? 27.1    ? 27.1    ? 26.1    ? 27.7    ? 29.1   22.0    ?TCO2 22 - 32 mmol/L  21    ? 24    ? 26    ? 23    ? 23    ? 25    ? 24    ? 26    ? 25    ? 28    ? 26    ? 28    ? 28    ? 27    ? 27    ? 29    ? 29    ? 30    ? 28   23    ?Acid-base deficit 0.0 - 2.0 mmol/L  6.0    ? 3.0    ? 1.0    ? 2.0   3.0    ?O2 Saturation % 98.0   97.0    ? 98.0    ? 95.0    ? 99.0    ? 100.0    ? 100.0    ? 100.0    ? 100.0    ? 100.0    ? 85.0    ? 100.0    ? 100.0   98.0    ?  ? ? Multiple values from one day are sorted in reverse-chronological order  ?  ?  ? ? ?Capillary Blood Glucose: ?Lab Results  ?Component Value Date  ? GLUCAP 117 (H) 09/25/2021  ? GLUCAP 156 (H) 09/25/2021  ? GLUCAP 125 (H)  09/23/2021  ? GLUCAP 154 (H) 09/23/2021  ? GLUCAP 115 (H) 07/02/2021  ? ? ? ?Exercise Target Goals: ?Exercise Program Goal: ?Individual exercise prescription set using results from initial 6 min walk test and THRR while considering  patient?s activity barriers and safety.  ? ?Exercise Prescription Goal: ?Starting with aerobic activity 30 plus minutes a day, 3 days per week for initial exercise prescription. Provide home exercise prescription and guidelines that participant acknowledges understanding prior to discharge. ? ?Activity Barriers & Risk Stratification: ? Activity Barriers & Cardiac Risk Stratification - 09/17/21 1336   ? ?  ? Activity Barriers & Cardiac Risk Stratification  ? Activity Barriers Back Problems;Deconditioning;Muscular Weakness;Balance Concerns   ? Cardiac Risk Stratification High   ? ?  ?  ? ?  ? ? ?6 Minute Walk: ? 6 Minute Walk   ? ? [place] Name 09/17/21 1334  ?  ?  ?  ? 6 Minute Walk  ? Phase Initial    ? Distance 1065 feet    ? Walk Time 6 minutes    ? # of Rest Breaks 0    ? MPH 2.02    ? METS 2.79    ? RPE 9    ? Perceived Dyspnea  0    ?  VO2 Peak 9.76    ? Symptoms No    ? Resting HR 64 bpm    ? Resting BP 137/70    ? Resting Oxygen Saturation  98 %    ? Exercise Oxygen Saturation  during 6 min walk 98 %    ? Max Ex. HR 72 bpm    ? Max Ex. BP 138/70    ? 2 Minute Post BP 130/64    ? ?  ?  ? ?  ? ? ?Oxygen Initial Assessment: ? ? ?Oxygen Re-Evaluation: ? ? ?Oxygen Discharge (Final Oxygen Re-Evaluation): ? ? ?Initial Exercise Prescription: ? Initial Exercise Prescription - 09/17/21 1300   ? ?  ? Date of Initial Exercise RX and Referring Provider  ? Date 09/17/21   ? Referring Provider Dr. Harriet Masson   ? Expected Discharge Date 11/15/21   ?  ? NuStep  ? Level 1   ? SPM 75   ? Minutes 30   ? METs 2   ?  ? Prescription Details  ? Frequency (times per week) 3   ? Duration Progress to 30 minutes of continuous aerobic without signs/symptoms of physical distress   ?  ? Intensity  ? THRR 40-80% of Max  Heartrate 61-122   ? Ratings of Perceived Exertion 11-13   ? Perceived Dyspnea 0-4   ?  ? Progression  ? Progression Continue progressive overload as per policy without signs/symptoms or physical distress.   ?  ? Resistance Training  ? Training Prescription Yes   ? Weight 2 lbs   ? Reps 10-15   ? ?  ?  ? ?  ? ? ?Perform Capillary Blood Glucose checks as needed. ? ?Exercise Prescription Changes: ? ? Exercise Prescription Changes   ? ? [place] Name 09/23/21 1400 10/11/21 1500 10/14/21 1400  ?  ?  ?  ? Response to Exercise  ? Blood Pressure (Admit) 124/64 130/70 122/66    ? Blood Pressure (Exercise) 150/84 142/64 140/68    ? Blood Pressure (Exit) 110/68 110/64 110/70    ? Heart Rate (Admit) 69 bpm 75 bpm 70 bpm    ? Heart Rate (Exercise) 87 bpm 86 bpm 86 bpm    ? Heart Rate (Exit) 69 bpm 73 bpm 65 bpm    ? Rating of Perceived Exertion (Exercise) '11 11 10    '$ ? Symptoms None None None    ? Comments Pt's first day in the CRP2 program Reviewed METs Reviewed Goals and home exercise Rx    ? Duration Continue with 30 min of aerobic exercise without signs/symptoms of physical distress. Continue with 30 min of aerobic exercise without signs/symptoms of physical distress. Continue with 30 min of aerobic exercise without signs/symptoms of physical distress.    ? Intensity THRR unchanged THRR unchanged THRR unchanged    ?  ? Progression  ? Progression Continue to progress workloads to maintain intensity without signs/symptoms of physical distress. Continue to progress workloads to maintain intensity without signs/symptoms of physical distress. Continue to progress workloads to maintain intensity without signs/symptoms of physical distress.    ? Average METs 2.2 2.9 2.6    ?  ? Resistance Training  ? Training Prescription Yes Yes Yes    ? Weight 2 lbs 2 lbs 2 lbs    ? Reps 10-15 10-15 10-15    ? Time 10 Minutes 10 Minutes 10 Minutes    ?  ? Interval Training  ? Interval Training No No No    ?  ?  NuStep  ? Level '1 3 3    '$ ? Minutes '26 30  30    '$ ? METs 2.2 2.9 2.6    ?  ? Home Exercise Plan  ? Plans to continue exercise at -- -- Home (comment)    ? Frequency -- -- Add 3 additional days to program exercise sessions.    ? Initial Home Exercises Provided

## 2021-10-23 ENCOUNTER — Encounter (HOSPITAL_COMMUNITY)
Admission: RE | Admit: 2021-10-23 | Discharge: 2021-10-23 | Disposition: A | Discharge status: Home / Self Care | Payer: Medicare Other | Source: Ambulatory Visit | Attending: Cardiology | Admitting: Cardiology

## 2021-10-23 DIAGNOSIS — Z951 Presence of aortocoronary bypass graft: Secondary | ICD-10-CM

## 2021-10-23 DIAGNOSIS — Z48812 Encounter for surgical aftercare following surgery on the circulatory system: Secondary | ICD-10-CM | POA: Diagnosis not present

## 2021-10-25 ENCOUNTER — Encounter (HOSPITAL_COMMUNITY)
Admission: RE | Admit: 2021-10-25 | Discharge: 2021-10-25 | Disposition: A | Discharge status: Home / Self Care | Payer: Medicare Other | Source: Ambulatory Visit | Attending: Cardiology | Admitting: Cardiology

## 2021-10-25 DIAGNOSIS — Z48812 Encounter for surgical aftercare following surgery on the circulatory system: Secondary | ICD-10-CM | POA: Diagnosis not present

## 2021-10-25 DIAGNOSIS — Z951 Presence of aortocoronary bypass graft: Secondary | ICD-10-CM

## 2021-10-28 ENCOUNTER — Encounter (HOSPITAL_COMMUNITY)
Admission: RE | Admit: 2021-10-28 | Discharge: 2021-10-28 | Disposition: A | Discharge status: Home / Self Care | Payer: Medicare Other | Source: Ambulatory Visit | Attending: Cardiology | Admitting: Cardiology

## 2021-10-28 DIAGNOSIS — Z951 Presence of aortocoronary bypass graft: Secondary | ICD-10-CM

## 2021-10-28 DIAGNOSIS — Z48812 Encounter for surgical aftercare following surgery on the circulatory system: Secondary | ICD-10-CM | POA: Diagnosis not present

## 2021-10-30 ENCOUNTER — Encounter (HOSPITAL_COMMUNITY)
Admission: RE | Admit: 2021-10-30 | Discharge: 2021-10-30 | Disposition: A | Discharge status: Home / Self Care | Payer: Medicare Other | Source: Ambulatory Visit | Attending: Cardiology | Admitting: Cardiology

## 2021-10-30 DIAGNOSIS — Z951 Presence of aortocoronary bypass graft: Secondary | ICD-10-CM

## 2021-10-30 DIAGNOSIS — Z48812 Encounter for surgical aftercare following surgery on the circulatory system: Secondary | ICD-10-CM | POA: Diagnosis not present

## 2021-11-01 ENCOUNTER — Encounter (HOSPITAL_COMMUNITY)
Admission: RE | Admit: 2021-11-01 | Discharge: 2021-11-01 | Disposition: A | Discharge status: Home / Self Care | Payer: Medicare Other | Source: Ambulatory Visit | Attending: Cardiology | Admitting: Cardiology

## 2021-11-01 DIAGNOSIS — Z48812 Encounter for surgical aftercare following surgery on the circulatory system: Secondary | ICD-10-CM | POA: Diagnosis not present

## 2021-11-01 DIAGNOSIS — Z951 Presence of aortocoronary bypass graft: Secondary | ICD-10-CM

## 2021-11-04 ENCOUNTER — Encounter (HOSPITAL_COMMUNITY)
Admission: RE | Admit: 2021-11-04 | Discharge: 2021-11-04 | Disposition: A | Discharge status: Home / Self Care | Payer: Medicare Other | Source: Ambulatory Visit | Attending: Cardiology | Admitting: Cardiology

## 2021-11-04 DIAGNOSIS — Z951 Presence of aortocoronary bypass graft: Secondary | ICD-10-CM

## 2021-11-04 DIAGNOSIS — Z48812 Encounter for surgical aftercare following surgery on the circulatory system: Secondary | ICD-10-CM | POA: Diagnosis not present

## 2021-11-06 ENCOUNTER — Encounter (HOSPITAL_COMMUNITY)
Admission: RE | Admit: 2021-11-06 | Discharge: 2021-11-06 | Disposition: A | Discharge status: Home / Self Care | Payer: Medicare Other | Source: Ambulatory Visit | Attending: Cardiology | Admitting: Cardiology

## 2021-11-06 DIAGNOSIS — Z48812 Encounter for surgical aftercare following surgery on the circulatory system: Secondary | ICD-10-CM | POA: Diagnosis not present

## 2021-11-06 DIAGNOSIS — Z951 Presence of aortocoronary bypass graft: Secondary | ICD-10-CM

## 2021-11-08 ENCOUNTER — Encounter (HOSPITAL_COMMUNITY)
Admission: RE | Admit: 2021-11-08 | Discharge: 2021-11-08 | Disposition: A | Discharge status: Home / Self Care | Payer: Medicare Other | Source: Ambulatory Visit | Attending: Cardiology | Admitting: Cardiology

## 2021-11-08 VITALS — Ht 68.75 in | Wt 148.1 lb

## 2021-11-08 DIAGNOSIS — Z48812 Encounter for surgical aftercare following surgery on the circulatory system: Secondary | ICD-10-CM | POA: Diagnosis not present

## 2021-11-08 DIAGNOSIS — Z951 Presence of aortocoronary bypass graft: Secondary | ICD-10-CM

## 2021-11-08 NOTE — Progress Notes (Incomplete)
Discharge Progress Report  Patient Details  Name: Sean Kirby MRN: [id number] Date of Birth: 06-19-1955 Referring Provider:   Flowsheet Row CARDIAC REHAB PHASE II ORIENTATION from 09/17/2021 in [facility]  Referring Provider Dr. Harriet Masson        Number of Visits: ***  Reason for Discharge:  Patient reached a stable level of exercise. Patient independent in their exercise. Patient has met program and personal goals.  Smoking History:  Social History   Tobacco Use  Smoking Status Never  Smokeless Tobacco Never    Diagnosis:  06/25/21 S/P CABG x 4  ADL UCSD:   Initial Exercise Prescription:  Initial Exercise Prescription - 09/17/21 1300       Date of Initial Exercise RX and Referring Provider   Date 09/17/21    Referring Provider Dr. Harriet Masson    Expected Discharge Date 11/15/21      NuStep   Level 1    SPM 75    Minutes 30    METs 2      Prescription Details   Frequency (times per week) 3    Duration Progress to 30 minutes of continuous aerobic without signs/symptoms of physical distress      Intensity   THRR 40-80% of Max Heartrate 61-122    Ratings of Perceived Exertion 11-13    Perceived Dyspnea 0-4      Progression   Progression Continue progressive overload as per policy without signs/symptoms or physical distress.      Resistance Training   Training Prescription Yes    Weight 2 lbs    Reps 10-15             Discharge Exercise Prescription (Final Exercise Prescription Changes):  Exercise Prescription Changes - 11/06/21 1500       Response to Exercise   Blood Pressure (Admit) 124/70    Blood Pressure (Exercise) 128/62    Blood Pressure (Exit) 104/60    Heart Rate (Admit) 71 bpm    Heart Rate (Exercise) 107 bpm    Heart Rate (Exit) 74 bpm    Rating of Perceived Exertion (Exercise) 11    Symptoms None    Comments Reviewed MEts and goals    Duration Continue with 30 min of aerobic exercise without  signs/symptoms of physical distress.    Intensity THRR unchanged      Progression   Progression Continue to progress workloads to maintain intensity without signs/symptoms of physical distress.    Average METs 3.53      Resistance Training   Training Prescription Yes    Weight 5 lbs    Reps 10-15    Time 10 Minutes      Interval Training   Interval Training No      NuStep   Level 5    Minutes 30    METs 4.5      Home Exercise Plan   Plans to continue exercise at Home (comment)    Frequency Add 3 additional days to program exercise sessions.    Initial Home Exercises Provided 10/14/21             Functional Capacity:  6 Minute Walk     Row Name 09/17/21 1334 11/07/21 0911       6 Minute Walk   Phase Initial Discharge    Distance 1065 feet 1079 feet    Distance % Change -- 1.31 %    Distance Feet Change -- 14 ft    Walk Time  6 minutes 6 minutes    # of Rest Breaks 0 0    MPH 2.02 2.04    METS 2.79 2.9    RPE 9 9    Perceived Dyspnea  0 0    VO2 Peak 9.76 10.03    Symptoms No No    Resting HR 64 bpm 71 bpm    Resting BP 137/70 124/70    Resting Oxygen Saturation  98 % 98 %    Exercise Oxygen Saturation  during 6 min walk 98 % --    Max Ex. HR 72 bpm 85 bpm    Max Ex. BP 138/70 128/62    2 Minute Post BP 130/64 --             Psychological, QOL, Others - Outcomes: PHQ 2/9:    11/06/2021    2:11 PM 09/17/2021   12:15 PM 09/17/2021   12:13 PM  Depression screen PHQ 2/9  Decreased Interest 0 1 1  Down, Depressed, Hopeless 0 0 0  PHQ - 2 Score 0 1 1  Altered sleeping  1   Tired, decreased energy  1   Change in appetite  1   Feeling bad or failure about yourself   0   Trouble concentrating  0   Moving slowly or fidgety/restless  0   Suicidal thoughts  0   PHQ-9 Score  4   Difficult doing work/chores  Not difficult at all     Quality of Life:  Quality of Life - 09/17/21 1310       Quality of Life   Select Quality of Life      Quality of  Life Scores   Health/Function Pre 21.69 %    Socioeconomic Pre 27.75 %    Psych/Spiritual Pre 23.36 %    Family Pre 22.5 %    GLOBAL Pre 23.5 %             Personal Goals: Goals established at orientation with interventions provided to work toward goal.  Personal Goals and Risk Factors at Admission - 09/17/21 1314       Core Components/Risk Factors/Patient Goals on Admission    Weight Management Weight Maintenance;Weight Loss;Yes    Intervention Weight Management: Develop a combined nutrition and exercise program designed to reach desired caloric intake, while maintaining appropriate intake of nutrient and fiber, sodium and fats, and appropriate energy expenditure required for the weight goal.;Weight Management: Provide education and appropriate resources to help participant work on and attain dietary goals.    Expected Outcomes Long Term: Adherence to nutrition and physical activity/exercise program aimed toward attainment of established weight goal;Weight Maintenance: Understanding of the daily nutrition guidelines, which includes 25-35% calories from fat, 7% or less cal from saturated fats, less than $RemoveB'200mg'[id number]$  cholesterol, less than 1.5gm of sodium, & 5 or more servings of fruits and vegetables daily;Understanding recommendations for meals to include 15-35% energy as protein, 25-35% energy from fat, 35-60% energy from carbohydrates, less than $RemoveB'200mg'XDoeTlrG$  of dietary cholesterol, 20-35 gm of total fiber daily;Understanding of distribution of calorie intake throughout the day with the consumption of 4-5 meals/snacks    Diabetes Yes    Intervention Provide education about signs/symptoms and action to take for hypo/hyperglycemia.;Provide education about proper nutrition, including hydration, and aerobic/resistive exercise prescription along with prescribed medications to achieve blood glucose in normal ranges: Fasting glucose 65-99 mg/dL    Expected Outcomes Short Term: Participant verbalizes  understanding of the signs/symptoms and immediate care of hyper/hypoglycemia, proper  foot care and importance of medication, aerobic/resistive exercise and nutrition plan for blood glucose control.;Long Term: Attainment of HbA1C < 7%.    Hypertension Yes    Intervention Provide education on lifestyle modifcations including regular physical activity/exercise, weight management, moderate sodium restriction and increased consumption of fresh fruit, vegetables, and low fat dairy, alcohol moderation, and smoking cessation.;Monitor prescription use compliance.    Expected Outcomes Short Term: Continued assessment and intervention until BP is < 140/40m HG in hypertensive participants. < 130/827mHG in hypertensive participants with diabetes, heart failure or chronic kidney disease.;Long Term: Maintenance of blood pressure at goal levels.    Lipids Yes    Intervention Provide education and support for participant on nutrition & aerobic/resistive exercise along with prescribed medications to achieve LDL <7048mHDL >40m6m  Expected Outcomes Short Term: Participant states understanding of desired cholesterol values and is compliant with medications prescribed. Participant is following exercise prescription and nutrition guidelines.;Long Term: Cholesterol controlled with medications as prescribed, with individualized exercise RX and with personalized nutrition plan. Value goals: LDL < 70mg51mL > 40 mg.    Stress Yes    Intervention Offer individual and/or small group education and counseling on adjustment to heart disease, stress management and health-related lifestyle change. Teach and support self-help strategies.;Refer participants experiencing significant psychosocial distress to appropriate mental health specialists for further evaluation and treatment. When possible, include family members and significant others in education/counseling sessions.    Expected Outcomes Short Term: Participant demonstrates  changes in health-related behavior, relaxation and other stress management skills, ability to obtain effective social support, and compliance with psychotropic medications if prescribed.;Long Term: Emotional wellbeing is indicated by absence of clinically significant psychosocial distress or social isolation.              Personal Goals Discharge:  Goals and Risk Factor Review     Row Name 09/23/21 1711 10/22/21 1730           Core Components/Risk Factors/Patient Goals Review   Personal Goals Review Weight Management/Obesity;Hypertension;Lipids;Diabetes;Stress Weight Management/Obesity;Hypertension;Lipids;Diabetes;Stress      Review CurtiJermanted cardiac rehab on 09/23/21 and did well with exercise for his fitness level CurtiQuameloing well with exercise at phase 2 cardiac rehab. Vital signs and CBG's have been stable      Expected Outcomes CurtiDillinger continue to participate in phase 2 cardiac rehab for exercise, nutrition and lifestyle modifications. CurtiHiroyuki continue to participate in phase 2 cardiac rehab for exercise, nutrition and lifestyle modifications.               Exercise Goals and Review:  Exercise Goals     Row Name 09/17/21 1336             Exercise Goals   Increase Physical Activity Yes       Intervention Provide advice, education, support and counseling about physical activity/exercise needs.;Develop an individualized exercise prescription for aerobic and resistive training based on initial evaluation findings, risk stratification, comorbidities and participant's personal goals.       Expected Outcomes Short Term: Attend rehab on a regular basis to increase amount of physical activity.;Long Term: Add in home exercise to make exercise part of routine and to increase amount of physical activity.;Long Term: Exercising regularly at least 3-5 days a week.       Increase Strength and Stamina Yes       Intervention Provide advice, education, support and  counseling about physical activity/exercise needs.;Develop an individualized exercise prescription for aerobic  and resistive training based on initial evaluation findings, risk stratification, comorbidities and participant's personal goals.       Expected Outcomes Short Term: Increase workloads from initial exercise prescription for resistance, speed, and METs.;Short Term: Perform resistance training exercises routinely during rehab and add in resistance training at home;Long Term: Improve cardiorespiratory fitness, muscular endurance and strength as measured by increased METs and functional capacity (6MWT)       Able to understand and use rate of perceived exertion (RPE) scale Yes       Intervention Provide education and explanation on how to use RPE scale       Expected Outcomes Short Term: Able to use RPE daily in rehab to express subjective intensity level;Long Term:  Able to use RPE to guide intensity level when exercising independently       Knowledge and understanding of Target Heart Rate Range (THRR) Yes       Intervention Provide education and explanation of THRR including how the numbers were predicted and where they are located for reference       Expected Outcomes Short Term: Able to state/look up THRR;Short Term: Able to use daily as guideline for intensity in rehab;Long Term: Able to use THRR to govern intensity when exercising independently       Understanding of Exercise Prescription Yes       Intervention Provide education, explanation, and written materials on patient's individual exercise prescription       Expected Outcomes Short Term: Able to explain program exercise prescription;Long Term: Able to explain home exercise prescription to exercise independently                Exercise Goals Re-Evaluation:  Exercise Goals Re-Evaluation     Row Name 09/23/21 1433 10/14/21 1438 11/07/21 0923         Exercise Goal Re-Evaluation   Exercise Goals Review Increase Physical  Activity;Increase Strength and Stamina;Able to understand and use rate of perceived exertion (RPE) scale;Knowledge and understanding of Target Heart Rate Range (THRR);Understanding of Exercise Prescription Increase Physical Activity;Increase Strength and Stamina;Able to understand and use rate of perceived exertion (RPE) scale;Knowledge and understanding of Target Heart Rate Range (THRR);Understanding of Exercise Prescription Increase Physical Activity;Increase Strength and Stamina;Able to understand and use rate of perceived exertion (RPE) scale;Knowledge and understanding of Target Heart Rate Range (THRR);Understanding of Exercise Prescription     Comments Pt's first day in the CRP2 program. Pt understnads the exercise Rx, THRR and RPE scale. Reviewed goals and home exercise Rx with patient today. Pt is walking at home 3x/week for 20 minutes in addtion to the CRP2 program 3x/week. Pt voices imrpoved strength and stamina which is a patient goal. Pt encouraged to begin to increase time until he reaches 30 minutes 3x/week. Reviewed goals and METs. Pt is making good pregress. Pt voices improved strength, stamina and endurance which are goals. Pt still has not started mowing his grass yet or done any significant travel which are his long term goals. Pt continues to walk at home in addtion to the [place] program.     Expected Outcomes Will continue to monitor patient and progress exercise workloads as tolerated. Pt will continue to walk at home 3 x/ week and increase time to 30 minutes. Will continue to monitor and progress exercise workloads as tolerated.              Nutrition & Weight - Outcomes:  Pre Biometrics - 09/17/21 1035       Pre Biometrics  Waist Circumference 34 inches    Hip Circumference 37 inches    Waist to Hip Ratio 0.92 %    Triceps Skinfold 9 mm    % Body Fat 20.5 %    Grip Strength 40 kg    Flexibility 12.5 in    Single Leg Stand 4.57 seconds              Nutrition:   Nutrition Therapy & Goals - 11/06/21 1434       Nutrition Therapy   Diet Heart healthy/Carbohydrate Consistent    Drug/Food Interactions Statins/Certain Fruits    Protein (specify units) 78g-91g/day (1.2-1.4g/kg actual body weight)      Personal Nutrition Goals   Nutrition Goal Prioritize lean protein/plant sources at all meals and snacks to support lean muscle mass, blood sugar, and heart health.    Personal Goal #2 Implement 2 snacks per day of protein + complex carbohydrate OR protein supplement/Boost to support weight maintenance/gain, blood sugar control, meeting nutrition needs.    Comments Reviewed strategies for weight gain/management and blood sugar control. Discussed the plate method as a guide for meal planning; stressed the importance of increasing fiber through vegetables and whole grains. Encouraged carbohydrate plus protein at snacks. Patient continues 1 Boost mid morning (240kcals, 10g protein).      Intervention Plan   Intervention Prescribe, educate and counsel regarding individualized specific dietary modifications aiming towards targeted core components such as weight, hypertension, lipid management, diabetes, heart failure and other comorbidities.;Nutrition handout(s) given to patient.    Expected Outcomes Short Term Goal: Understand basic principles of dietary content, such as calories, fat, sodium, cholesterol and nutrients.;Short Term Goal: A plan has been developed with personal nutrition goals set during dietitian appointment.;Long Term Goal: Adherence to prescribed nutrition plan.             Nutrition Discharge:  Nutrition Assessments - 11/08/21 1359       Rate Your Plate Scores   Post Score 69             Education Questionnaire Score:  Knowledge Questionnaire Score - 09/17/21 1313       Knowledge Questionnaire Score   Pre Score 17/24             Goals reviewed with patient; copy given to patient.Pt graduated from cardiac rehab program  today with completion of 36 exercise sessions in Phase II. Pt maintained good attendance and progressed nicely during his participation in rehab as evidenced by increased MET level.   Medication list reconciled. Repeat  PHQ score-  .  Pt has made significant lifestyle changes and should be commended for his success. Pt feels he has achieved his goals during cardiac rehab.   Pt plans to continue exercise by walking and using his exercise bike at home.

## 2021-11-13 ENCOUNTER — Encounter (HOSPITAL_COMMUNITY)
Admission: RE | Admit: 2021-11-13 | Discharge: 2021-11-13 | Disposition: A | Discharge status: Home / Self Care | Payer: Medicare Other | Source: Ambulatory Visit | Attending: Cardiology | Admitting: Cardiology

## 2021-11-13 DIAGNOSIS — Z48812 Encounter for surgical aftercare following surgery on the circulatory system: Secondary | ICD-10-CM | POA: Diagnosis not present

## 2021-11-13 DIAGNOSIS — Z951 Presence of aortocoronary bypass graft: Secondary | ICD-10-CM

## 2021-11-15 ENCOUNTER — Encounter (HOSPITAL_COMMUNITY)
Admission: RE | Admit: 2021-11-15 | Discharge: 2021-11-15 | Disposition: A | Discharge status: Home / Self Care | Payer: Medicare Other | Source: Ambulatory Visit | Attending: Cardiology | Admitting: Cardiology

## 2021-11-15 VITALS — BP 122/70 | HR 74 | Wt 149.5 lb

## 2021-11-15 DIAGNOSIS — Z951 Presence of aortocoronary bypass graft: Secondary | ICD-10-CM | POA: Diagnosis present

## 2021-11-21 ENCOUNTER — Encounter: Payer: Self-pay | Admitting: Cardiology

## 2022-01-22 ENCOUNTER — Telehealth: Payer: Self-pay

## 2022-01-22 NOTE — Telephone Encounter (Signed)
   Pre-operative Risk Assessment    Patient Name: DEKLYN GIBBON  DOB: 10/26/1955 MRN: [id number]      Request for Surgical Clearance    Procedure:   [place] Goniotomy OD and OS  Date of Surgery:  Clearance TBD                                 Surgeon:  Dr. Katy Fitch Surgeon's Group or Practice Name:  [facility] Phone number:  [phone] Fax number:  [phone]   Type of Clearance Requested:   - Medical    Type of Anesthesia:  MAC   Additional requests/questions:  Please advise surgeon/provider what medications should be held.  Signed, Elsie Lincoln Zenovia Justman   01/22/2022, 2:43 PM

## 2022-01-22 NOTE — Telephone Encounter (Signed)
Patient not on anticoagulation

## 2022-01-23 NOTE — Telephone Encounter (Signed)
I s/w Sean Kirby who tells me the surgery is scheduled for 02/04/22, I thanked [place] for the update on the DOS. Per Sean Kirby ok to have pt see his cardiologist 01/27/22 before procedure as not urgent.

## 2022-01-23 NOTE — Telephone Encounter (Signed)
    Patient Name: Sean Kirby  DOB: 21-Mar-1956 MRN: [id number]  Primary Cardiologist: Berniece Salines, DO  Chart reviewed as part of pre-operative protocol coverage.   The patient already has an upcoming appointment scheduled 01/27/22 with Dr. Harriet Masson at which time this clearance should be addressed. Clarified with surgeon below that procedure is not urgent.  - I added "preop" comment to appointment notes so that provider is aware to address at time of OV. Per office protocol, the provider seeing this patient should forward their finalized clearance decision to requesting party below.  - Surgeon aware per callback's call that pt is pending appt. Will remove from preop box as separate preop APP input not necessary at this time.  Charlie Pitter, PA-C 01/23/2022, 3:58 PM

## 2022-01-23 NOTE — Telephone Encounter (Signed)
Will route to callback to find out if eye surgery is urgent. If not, will plan to keep f/u in 4 days to address preop clearance. Please route back to preop pool with reply so we can finalize. Thanks.

## 2022-01-27 ENCOUNTER — Ambulatory Visit (INDEPENDENT_AMBULATORY_CARE_PROVIDER_SITE_OTHER): Payer: Medicare Other | Admitting: Cardiology

## 2022-01-27 ENCOUNTER — Encounter: Payer: Self-pay | Admitting: Cardiology

## 2022-01-27 VITALS — BP 122/84 | HR 75 | Ht 68.0 in | Wt 153.0 lb

## 2022-01-27 DIAGNOSIS — E782 Mixed hyperlipidemia: Secondary | ICD-10-CM | POA: Diagnosis not present

## 2022-01-27 DIAGNOSIS — I2510 Atherosclerotic heart disease of native coronary artery without angina pectoris: Secondary | ICD-10-CM | POA: Diagnosis not present

## 2022-01-27 DIAGNOSIS — Z951 Presence of aortocoronary bypass graft: Secondary | ICD-10-CM | POA: Diagnosis not present

## 2022-01-27 DIAGNOSIS — I10 Essential (primary) hypertension: Secondary | ICD-10-CM

## 2022-01-27 NOTE — Progress Notes (Unsigned)
Cardiology Office Note:    Date:  01/29/2022   ID:  Sean Kirby, DOB 1955/11/30, MRN [id number]  PCP:  Shirline Frees, MD  Cardiologist:  Berniece Salines, DO  Electrophysiologist:  None   Referring MD: Shirline Frees, MD   " I am doing ok"  History of Present Illness:    Sean Kirby is a 66 y.o. male with a hx of coronary artery disease status post CABG x4 with LIMA to the LAD, saphenous vein graft to the ramus intermedius, saphenous vein graft to the circumflex marginal, saphenous vein graft to the posterior descending arteries on June 25, 2021, diabetes mellitus type 2, mixed hyperlipidemia, depression, GERD.   I first saw the patient on May 27, 2021 at that time he was experiencing intermittent chest discomfort and palpitations.  I recommended a coronary CTA to the patient given his risk factors.  He was agreeable.  He was hypertensive in the office that day but he gave me his blood pressure information from home which was lower than his presentation.  Therefore no antihypertensive medication was adjusted.   I saw the patient on June 14, 2019 to discuss his coronary CT scan which showed severe multivessel coronary artery disease and the need for left heart catheterization.  He proceeded with a left heart catheterization which was done on June 18, 2021 by Dr. Tamala Julian at that time given the severity of his disease it was requested that he he be seen by CT surgery.  He was evaluated by CT surgery and underwent coronary artery bypass grafting on June 25, 2021. He was discharged to home on July 02, 2021.  He is here with his sister today.  His postoperative course was complicated by development of atrial flutter which did not spontaneously convert he was placed on amiodarone and eventually converted back to sinus rhythm.  They both tell me that he has had good home visits with the home health nurses.   I saw the patient on 07/17/2021 at that time he was here with his  sister. He complained of leg pain and edema. I ordered arterial doppler for the patient which was normal. Since his visit he has been titrated off the beta blocker.     Past Medical History:  Diagnosis Date   Anxiety    Arthritis    Basal cell carcinoma (BCC) of cheek 2021   Coronary artery disease 2022   Depression    Diabetes mellitus    GERD (gastroesophageal reflux disease)    Glaucoma    Hyperlipidemia    Hypertension    Hypoglycemia    Neuromuscular disorder (HCC)    neuropathy feet    Past Surgical History:  Procedure Laterality Date   CARDIAC CATHETERIZATION     CORONARY ARTERY BYPASS GRAFT N/A 06/25/2021   Procedure: CORONARY ARTERY BYPASS GRAFTING (CABG) X4 USING LEFT INTERNAL MAMMARY ARTERY AND ENDOSCOPICALLY HARVESTED RIGHT GREATER SAPHENOUS VEIN;  Surgeon: [name], MD;  Location: [facility];  Service: Open Heart Surgery;  Laterality: N/A;   ENDOVEIN HARVEST OF GREATER SAPHENOUS VEIN Right 06/25/2021   Procedure: ENDOVEIN HARVEST OF GREATER SAPHENOUS VEIN;  Surgeon: [name], MD;  Location: [facility];  Service: Open Heart Surgery;  Laterality: Right;   GLAUCOMA REPAIR  06/16/2004   lazer-low angle -both   HERNIA REPAIR Right 2018   inguinal   INGUINAL HERNIA REPAIR  12/02/2011   Procedure: HERNIA REPAIR INGUINAL ADULT;  Surgeon: [name], MD;  Location: [place];  Service: General;  Laterality: Left;  left inguinal   LEFT HEART CATH AND CORONARY ANGIOGRAPHY N/A 06/18/2021   Procedure: LEFT HEART CATH AND CORONARY ANGIOGRAPHY;  Surgeon: [name], MD;  Location: [facility];  Service: Cardiovascular;  Laterality: N/A;   SIGMOIDOSCOPY     TONSILLECTOMY AND ADENOIDECTOMY      Current Medications: Current Meds  Medication Sig   acetaminophen (TYLENOL) 500 MG tablet Take 1,000 mg by mouth every 6 (six) hours as needed (for pain.).   alclomethasone (ACLOVATE) 0.05 % cream Apply 1 application topically 2 (two) times daily as  needed (skin irritation.).   amLODipine (NORVASC) 5 MG tablet Take 5 mg by mouth at bedtime.   aspirin 81 MG EC tablet Take 81 mg by mouth in the morning.   fexofenadine (ALLEGRA) 60 MG tablet Take 60 mg by mouth 2 (two) times daily.   glucose blood test strip 1 each by Other route as needed. Use as instructed   latanoprost (XALATAN) 0.005 % ophthalmic solution Place 1 drop into both eyes at bedtime.   LORazepam (ATIVAN) 0.5 MG tablet Take 0.5 mg by mouth daily as needed for anxiety.   metFORMIN (GLUCOPHAGE) 850 MG tablet Take 850 mg by mouth 2 (two) times daily with a meal.   rosuvastatin (CRESTOR) 20 MG tablet Take 1 tablet (20 mg total) by mouth daily.     Allergies:   Codeine, Septra [sulfamethoxazole-trimethoprim], Dapagliflozin, Sertraline hcl, Glimepiride, and Simvastatin   Social History   Socioeconomic History   Marital status: Single    Spouse name: Not on file   Number of children: 0   Years of education: Not on file   Highest education level: Not on file  Occupational History   Occupation: retired  Tobacco Use   Smoking status: Never   Smokeless tobacco: Never  Vaping Use   Vaping Use: Never used  Substance and Sexual Activity   Alcohol use: No   Drug use: No   Sexual activity: Not Currently  Other Topics Concern   Not on file  Social History Narrative   Right Handed   Lives in a one story home   Does not drink caffeine   Social Determinants of Health   Financial Resource Strain: Low Risk  (07/18/2021)   Overall Financial Resource Strain (CARDIA)    Difficulty of Paying Living Expenses: Not very hard  Food Insecurity: No Food Insecurity (07/18/2021)   Hunger Vital Sign    Worried About Running Out of Food in the Last Year: Never true    [place] in the Last Year: Never true  Transportation Needs: No Transportation Needs (07/18/2021)   PRAPARE - Hydrologist (Medical): No    Lack of Transportation (Non-Medical): No  Physical  Activity: Not on file  Stress: Not on file  Social Connections: Not on file     Family History: The patient's family history includes Heart disease in his maternal aunt, maternal grandmother, and mother. There is no history of Colon cancer.  ROS:   Review of Systems  Constitution: Negative for decreased appetite, fever and weight gain.  HENT: Negative for congestion, ear discharge, hoarse voice and sore throat.   Eyes: Negative for discharge, redness, vision loss in right eye and visual halos.  Cardiovascular: Negative for chest pain, dyspnea on exertion, leg swelling, orthopnea and palpitations.  Respiratory: Negative for cough, hemoptysis, shortness of breath and snoring.   Endocrine: Negative for heat intolerance and  polyphagia.  Hematologic/Lymphatic: Negative for bleeding problem. Does not bruise/bleed easily.  Skin: Negative for flushing, nail changes, rash and suspicious lesions.  Musculoskeletal: Negative for arthritis, joint pain, muscle cramps, myalgias, neck pain and stiffness.  Gastrointestinal: Negative for abdominal pain, bowel incontinence, diarrhea and excessive appetite.  Genitourinary: Negative for decreased libido, genital sores and incomplete emptying.  Neurological: Negative for brief paralysis, focal weakness, headaches and loss of balance.  Psychiatric/Behavioral: Negative for altered mental status, depression and suicidal ideas.  Allergic/Immunologic: Negative for HIV exposure and persistent infections.    EKGs/Labs/Other Studies Reviewed:    The following studies were reviewed today:   EKG:  The ekg ordered today demonstrates   Recent Labs: 06/28/2021: ALT 18; TSH 1.159 06/29/2021: Hemoglobin 10.6; Magnesium 2.1; Platelets 129 07/01/2021: BUN 16; Creatinine, Ser 1.19; Potassium 4.5; Sodium 134  Recent Lipid Panel No results found for: "CHOL", "TRIG", "HDL", "CHOLHDL", "VLDL", "LDLCALC", "LDLDIRECT"  Physical Exam:    VS:  BP 122/84   Pulse 75   Ht 5'  8" (1.727 m)   Wt 153 lb (69.4 kg)   SpO2 97%   BMI 23.26 kg/m     Wt Readings from Last 3 Encounters:  01/27/22 153 lb (69.4 kg)  11/15/21 149 lb 7.6 oz (67.8 kg)  11/08/21 148 lb 2.4 oz (67.2 kg)     GEN: Well nourished, well developed in no acute distress HEENT: Normal NECK: No JVD; No carotid bruits LYMPHATICS: No lymphadenopathy CARDIAC: S1S2 noted,RRR, no murmurs, rubs, gallops RESPIRATORY:  Clear to auscultation without rales, wheezing or rhonchi  ABDOMEN: Soft, non-tender, non-distended, +bowel sounds, no guarding. EXTREMITIES: No edema, No cyanosis, no clubbing MUSCULOSKELETAL:  No deformity  SKIN: Warm and dry NEUROLOGIC:  Alert and oriented x 3, non-focal PSYCHIATRIC:  Normal affect, good insight  ASSESSMENT:    1. Coronary artery disease involving native coronary artery of native heart without angina pectoris   2. Primary hypertension   3. S/P CABG x 4   4. Mixed hyperlipidemia    PLAN:    He is planning for eye surgery glaucoma and cataract. He can proceed with this procedure and does not need any further cardiovascular work up. From a cardiovascular standpoint I would prefer for the patient to continue his aspirin given his coronary heart disease and CABG less than a 23yrago. In addition recent data is suggestive that antiplatelets could be safely continued for cataract surgery without interruption.   HLN - continue current dose of statin.  HTN- blood pressure is at target.   The patient is in agreement with the above plan. The patient left the office in stable condition.  The patient will follow up in 12 months.   Medication Adjustments/Labs and Tests Ordered: Current medicines are reviewed at length with the patient today.  Concerns regarding medicines are outlined above.  Orders Placed This Encounter  Procedures   EKG 12-Lead   No orders of the defined types were placed in this encounter.   Patient Instructions  Medication Instructions:  Your  physician recommends that you continue on your current medications as directed. Please refer to the Current Medication list given to you today.  *If you need a refill on your cardiac medications before your next appointment, please call your pharmacy*   Lab Work: None If you have labs (blood work) drawn today and your tests are completely normal, you will receive your results only by: [place](if you have MyChart) OR A paper copy in the mail If you  have any lab test that is abnormal or we need to change your treatment, we will call you to review the results.   Testing/Procedures: None   Follow-Up: At [facility], you and your health needs are our priority.  As part of our continuing mission to provide you with exceptional heart care, we have created designated Provider Care Teams.  These Care Teams include your primary Cardiologist (physician) and Advanced Practice Providers (APPs -  Physician Assistants and Nurse Practitioners) who all work together to provide you with the care you need, when you need it.  We recommend signing up for the patient portal called "MyChart".  Sign up information is provided on this After Visit Summary.  MyChart is used to connect with patients for Virtual Visits (Telemedicine).  Patients are able to view lab/test results, encounter notes, upcoming appointments, etc.  Non-urgent messages can be sent to your provider as well.   To learn more about what you can do with MyChart, go to [email or web address].    Your next appointment:   6 month(s)  The format for your next appointment:   In Person  Provider:   Berniece Salines, DO {    Other Instructions   Important Information About Sugar         Adopting a Healthy Lifestyle.  Know what a healthy weight is for you (roughly BMI <25) and aim to maintain this   Aim for 7+ servings of fruits and vegetables daily   65-80+ fluid ounces of water or unsweet tea for healthy kidneys   Limit to  max 1 drink of alcohol per day; avoid smoking/tobacco   Limit animal fats in diet for cholesterol and heart health - choose grass fed whenever available   Avoid highly processed foods, and foods high in saturated/trans fats   Aim for low stress - take time to unwind and care for your mental health   Aim for 150 min of moderate intensity exercise weekly for heart health, and weights twice weekly for bone health   Aim for 7-9 hours of sleep daily   When it comes to diets, agreement about the perfect plan isnt easy to find, even among the experts. Experts at the [employer] developed an idea known as the Healthy Eating Plate. Just imagine a plate divided into logical, healthy portions.   The emphasis is on diet quality:   Load up on vegetables and fruits - one-half of your plate: Aim for color and variety, and remember that potatoes dont count.   Go for whole grains - one-quarter of your plate: Whole wheat, barley, wheat berries, quinoa, oats, brown rice, and foods made with them. If you want pasta, go with whole wheat pasta.   Protein power - one-quarter of your plate: Fish, chicken, beans, and nuts are all healthy, versatile protein sources. Limit red meat.   The diet, however, does go beyond the plate, offering a few other suggestions.   Use healthy plant oils, such as olive, canola, soy, corn, sunflower and peanut. Check the labels, and avoid partially hydrogenated oil, which have unhealthy trans fats.   If youre thirsty, drink water. Coffee and tea are good in moderation, but skip sugary drinks and limit milk and dairy products to one or two daily servings.   The type of carbohydrate in the diet is more important than the amount. Some sources of carbohydrates, such as vegetables, fruits, whole grains, and beans-are healthier than others.   Finally, stay active  Signed,  Berniece Salines, DO  01/29/2022 9:34 PM    [facility]

## 2022-01-27 NOTE — Patient Instructions (Signed)

## 2022-02-03 ENCOUNTER — Telehealth: Payer: Self-pay | Admitting: Cardiology

## 2022-02-03 NOTE — Telephone Encounter (Signed)
Returned [place] from [facility] back and left a message letting her know that I will refax the cardiac clearance recommendation for patient procedure.

## 2022-02-03 NOTE — Telephone Encounter (Signed)
  Sean Kirby is calling back. She said, she got a fax today but its only med list. She would like to clarify if pt is cleared to get procedure tomorrow

## 2022-02-03 NOTE — Telephone Encounter (Signed)
See 8/09 enounter:  Earnest Bailey with [facility] is following up. Procedure is scheduled for tomorrow, 8/22 and they would like to confirm whether the patient has been cleared.  Phone number: [phone] (ext#: [phone]) Fax number: [phone]

## 2022-06-13 ENCOUNTER — Other Ambulatory Visit: Payer: Self-pay | Admitting: Cardiology

## 2022-06-13 NOTE — Telephone Encounter (Signed)
Rx refill sent to pharmacy. 

## 2022-07-28 ENCOUNTER — Encounter: Payer: Self-pay | Admitting: Cardiology

## 2022-08-08 ENCOUNTER — Ambulatory Visit: Payer: Medicare Other | Attending: Cardiology | Admitting: Cardiology

## 2022-08-08 ENCOUNTER — Encounter: Payer: Self-pay | Admitting: Cardiology

## 2022-08-08 VITALS — BP 138/84 | HR 91 | Ht 66.0 in | Wt 159.0 lb

## 2022-08-08 DIAGNOSIS — I2510 Atherosclerotic heart disease of native coronary artery without angina pectoris: Secondary | ICD-10-CM | POA: Diagnosis not present

## 2022-08-08 DIAGNOSIS — E0800 Diabetes mellitus due to underlying condition with hyperosmolarity without nonketotic hyperglycemic-hyperosmolar coma (NKHHC): Secondary | ICD-10-CM | POA: Insufficient documentation

## 2022-08-08 DIAGNOSIS — I10 Essential (primary) hypertension: Secondary | ICD-10-CM | POA: Diagnosis present

## 2022-08-08 DIAGNOSIS — E1121 Type 2 diabetes mellitus with diabetic nephropathy: Secondary | ICD-10-CM | POA: Insufficient documentation

## 2022-08-08 NOTE — Patient Instructions (Addendum)
Medication Instructions:  Your physician recommends that you continue on your current medications as directed. Please refer to the Current Medication list given to you today.  *If you need a refill on your cardiac medications before your next appointment, please call your pharmacy*   Lab Work: None   Testing/Procedures: None   Follow-Up: At [facility], you and your health needs are our priority.  As part of our continuing mission to provide you with exceptional heart care, we have created designated Provider Care Teams.  These Care Teams include your primary Cardiologist (physician) and Advanced Practice Providers (APPs -  Physician Assistants and Nurse Practitioners) who all work together to provide you with the care you need, when you need it.  Your next appointment:   9 month(s)  Provider:   Berniece Salines, DO     Other Instructions Please see [facility] for diabetic education.

## 2022-08-08 NOTE — Progress Notes (Signed)
Cardiology Office Note:    Date:  08/10/2022   ID:  Sean Kirby, DOB November 17, 1955, MRN [id number]  PCP:  Shirline Frees, MD  Cardiologist:  Berniece Salines, DO  Electrophysiologist:  None   Referring MD: Shirline Frees, MD   " I am doing ok"  History of Present Illness:    Sean Kirby is a 67 y.o. male with a hx of coronary artery disease status post CABG x4 with LIMA to the LAD, saphenous vein graft to the ramus intermedius, saphenous vein graft to the circumflex marginal, saphenous vein graft to the posterior descending arteries on June 25, 2021, diabetes mellitus type 2, mixed hyperlipidemia, depression, GERD.   I first saw the patient on May 27, 2021 at that time he was experiencing intermittent chest discomfort and palpitations.  I recommended a coronary CTA to the patient given his risk factors.  He was agreeable.  He was hypertensive in the office that day but he gave me his blood pressure information from home which was lower than his presentation.  Therefore no antihypertensive medication was adjusted.   I saw the patient on June 14, 2019 to discuss his coronary CT scan which showed severe multivessel coronary artery disease and the need for left heart catheterization.  He proceeded with a left heart catheterization which was done on June 18, 2021 by Dr. Tamala Julian at that time given the severity of his disease it was requested that he he be seen by CT surgery.  He was evaluated by CT surgery and underwent coronary artery bypass grafting on June 25, 2021. He was discharged to home on July 02, 2021.  He is here with his sister today.  His postoperative course was complicated by development of atrial flutter which did not spontaneously convert he was placed on amiodarone and eventually converted back to sinus rhythm.  They both tell me that he has had good home visits with the home health nurses.   I saw the patient on 07/17/2021 at that time he was here with his  sister. He complained of leg pain and edema. I ordered arterial doppler for the patient which was normal. Since his visit he has been titrated off the beta blocker.   At his last visit he was planning his an eye surgery.   Today he tells me that he was recently diagnosed with Rosacea.   No other complaints at this time.    Past Medical History:  Diagnosis Date   Anxiety    Arthritis    Basal cell carcinoma (BCC) of cheek 2021   Coronary artery disease 2022   Depression    Diabetes mellitus    GERD (gastroesophageal reflux disease)    Glaucoma    Hyperlipidemia    Hypertension    Hypoglycemia    Neuromuscular disorder (HCC)    neuropathy feet    Past Surgical History:  Procedure Laterality Date   CARDIAC CATHETERIZATION     CORONARY ARTERY BYPASS GRAFT N/A 06/25/2021   Procedure: CORONARY ARTERY BYPASS GRAFTING (CABG) X4 USING LEFT INTERNAL MAMMARY ARTERY AND ENDOSCOPICALLY HARVESTED RIGHT GREATER SAPHENOUS VEIN;  Surgeon: [name], MD;  Location: [facility];  Service: Open Heart Surgery;  Laterality: N/A;   ENDOVEIN HARVEST OF GREATER SAPHENOUS VEIN Right 06/25/2021   Procedure: ENDOVEIN HARVEST OF GREATER SAPHENOUS VEIN;  Surgeon: [name], MD;  Location: [facility];  Service: Open Heart Surgery;  Laterality: Right;   GLAUCOMA REPAIR  06/16/2004   lazer-low angle -both   HERNIA  REPAIR Right 2018   inguinal   INGUINAL HERNIA REPAIR  12/02/2011   Procedure: HERNIA REPAIR INGUINAL ADULT;  Surgeon: [name], MD;  Location: [facility];  Service: General;  Laterality: Left;  left inguinal   LEFT HEART CATH AND CORONARY ANGIOGRAPHY N/A 06/18/2021   Procedure: LEFT HEART CATH AND CORONARY ANGIOGRAPHY;  Surgeon: [name], MD;  Location: [facility];  Service: Cardiovascular;  Laterality: N/A;   SIGMOIDOSCOPY     TONSILLECTOMY AND ADENOIDECTOMY      Current Medications: Current Meds  Medication Sig   acetaminophen (TYLENOL) 500 MG tablet  Take 1,000 mg by mouth every 6 (six) hours as needed (for pain.).   alclomethasone (ACLOVATE) 0.05 % cream Apply 1 application topically 2 (two) times daily as needed (skin irritation.).   ALPHAGAN P 0.1 % SOLN    amLODipine (NORVASC) 5 MG tablet Take 5 mg by mouth at bedtime.   aspirin 81 MG EC tablet Take 81 mg by mouth in the morning.   dorzolamide (TRUSOPT) 2 % ophthalmic solution 1 drop 2 (two) times daily.   fexofenadine (ALLEGRA) 60 MG tablet Take 60 mg by mouth 2 (two) times daily.   glucose blood test strip 1 each by Other route as needed. Use as instructed   latanoprost (XALATAN) 0.005 % ophthalmic solution Place 1 drop into both eyes at bedtime.   LORazepam (ATIVAN) 0.5 MG tablet Take 0.5 mg by mouth daily as needed for anxiety.   metFORMIN (GLUCOPHAGE) 850 MG tablet Take 850 mg by mouth 2 (two) times daily with a meal.   Multiple Vitamin (MULTIVITAMIN WITH MINERALS) TABS Take 2 tablets by mouth in the morning. gummy   polyethylene glycol (MIRALAX / GLYCOLAX) 17 g packet Take 17 g by mouth daily as needed (constipation).   rosuvastatin (CRESTOR) 20 MG tablet Take 1 tablet (20 mg total) by mouth daily.     Allergies:   Codeine, Septra [sulfamethoxazole-trimethoprim], Dapagliflozin, Sertraline hcl, Glimepiride, and Simvastatin   Social History   Socioeconomic History   Marital status: Single    Spouse name: Not on file   Number of children: 0   Years of education: Not on file   Highest education level: Not on file  Occupational History   Occupation: retired  Tobacco Use   Smoking status: Never   Smokeless tobacco: Never  Vaping Use   Vaping Use: Never used  Substance and Sexual Activity   Alcohol use: No   Drug use: No   Sexual activity: Not Currently  Other Topics Concern   Not on file  Social History Narrative   Right Handed   Lives in a one story home   Does not drink caffeine   Social Determinants of Health   Financial Resource Strain: Low Risk  (07/18/2021)    Overall Financial Resource Strain (CARDIA)    Difficulty of Paying Living Expenses: Not very hard  Food Insecurity: No Food Insecurity (07/18/2021)   Hunger Vital Sign    Worried About Running Out of Food in the Last Year: Never true    [place] in the Last Year: Never true  Transportation Needs: No Transportation Needs (07/18/2021)   PRAPARE - Hydrologist (Medical): No    Lack of Transportation (Non-Medical): No  Physical Activity: Not on file  Stress: Not on file  Social Connections: Not on file     Family History: The patient's family history includes Heart disease in  his maternal aunt, maternal grandmother, and mother. There is no history of Colon cancer.  ROS:   Review of Systems  Constitution: Negative for decreased appetite, fever and weight gain.  HENT: Negative for congestion, ear discharge, hoarse voice and sore throat.   Eyes: Negative for discharge, redness, vision loss in right eye and visual halos.  Cardiovascular: Negative for chest pain, dyspnea on exertion, leg swelling, orthopnea and palpitations.  Respiratory: Negative for cough, hemoptysis, shortness of breath and snoring.   Endocrine: Negative for heat intolerance and polyphagia.  Hematologic/Lymphatic: Negative for bleeding problem. Does not bruise/bleed easily.  Skin: Negative for flushing, nail changes, rash and suspicious lesions.  Musculoskeletal: Negative for arthritis, joint pain, muscle cramps, myalgias, neck pain and stiffness.  Gastrointestinal: Negative for abdominal pain, bowel incontinence, diarrhea and excessive appetite.  Genitourinary: Negative for decreased libido, genital sores and incomplete emptying.  Neurological: Negative for brief paralysis, focal weakness, headaches and loss of balance.  Psychiatric/Behavioral: Negative for altered mental status, depression and suicidal ideas.  Allergic/Immunologic: Negative for HIV exposure and persistent infections.     EKGs/Labs/Other Studies Reviewed:    The following studies were reviewed today:   EKG:  The ekg ordered today demonstrates   Recent Labs: No results found for requested labs within last 365 days.  Recent Lipid Panel No results found for: "CHOL", "TRIG", "HDL", "CHOLHDL", "VLDL", "LDLCALC", "LDLDIRECT"  Physical Exam:    VS:  BP 138/84   Pulse 91   Ht '5\' 6"'$  (1.676 m)   Wt 72.1 kg   SpO2 96%   BMI 25.66 kg/m     Wt Readings from Last 3 Encounters:  08/08/22 72.1 kg  01/27/22 69.4 kg  11/15/21 67.8 kg     GEN: Well nourished, well developed in no acute distress HEENT: Normal NECK: No JVD; No carotid bruits LYMPHATICS: No lymphadenopathy CARDIAC: S1S2 noted,RRR, no murmurs, rubs, gallops RESPIRATORY:  Clear to auscultation without rales, wheezing or rhonchi  ABDOMEN: Soft, non-tender, non-distended, +bowel sounds, no guarding. EXTREMITIES: No edema, No cyanosis, no clubbing MUSCULOSKELETAL:  No deformity  SKIN: Warm and dry NEUROLOGIC:  Alert and oriented x 3, non-focal PSYCHIATRIC:  Normal affect, good insight  ASSESSMENT:    1. Diabetes mellitus due to underlying condition with hyperosmolarity without coma, without long-term current use of insulin ([facility])   2. Coronary artery disease involving native coronary artery of native heart without angina pectoris   3. Primary hypertension   4. Controlled type 2 diabetes mellitus with diabetic nephropathy, without long-term current use of insulin (HCC)     PLAN:    CAD - no anginal symptoms. Will continue current regimen.  HLN - continue current dose of statin.  HTN- blood pressure is at target.   The patient is in agreement with the above plan. The patient left the office in stable condition.  The patient will follow up in 12 months.   Medication Adjustments/Labs and Tests Ordered: Current medicines are reviewed at length with the patient today.  Concerns regarding medicines are outlined above.  Orders Placed  This Encounter  Procedures   AMB Referral to [facility]   No orders of the defined types were placed in this encounter.   Patient Instructions  Medication Instructions:  Your physician recommends that you continue on your current medications as directed. Please refer to the Current Medication list given to you today.  *If you need a refill on your cardiac medications before your next appointment, please call your pharmacy*   Lab  Work: None   Testing/Procedures: None   Follow-Up: At [facility], you and your health needs are our priority.  As part of our continuing mission to provide you with exceptional heart care, we have created designated Provider Care Teams.  These Care Teams include your primary Cardiologist (physician) and Advanced Practice Providers (APPs -  Physician Assistants and Nurse Practitioners) who all work together to provide you with the care you need, when you need it.  Your next appointment:   9 month(s)  Provider:   Berniece Salines, DO     Other Instructions Please see [facility] for diabetic education.      Adopting a Healthy Lifestyle.  Know what a healthy weight is for you (roughly BMI <25) and aim to maintain this   Aim for 7+ servings of fruits and vegetables daily   65-80+ fluid ounces of water or unsweet tea for healthy kidneys   Limit to max 1 drink of alcohol per day; avoid smoking/tobacco   Limit animal fats in diet for cholesterol and heart health - choose grass fed whenever available   Avoid highly processed foods, and foods high in saturated/trans fats   Aim for low stress - take time to unwind and care for your mental health   Aim for 150 min of moderate intensity exercise weekly for heart health, and weights twice weekly for bone health   Aim for 7-9 hours of sleep daily   When it comes to diets, agreement about the perfect plan isnt easy to find, even among the experts. Experts at the [employer]  developed an idea known as the Healthy Eating Plate. Just imagine a plate divided into logical, healthy portions.   The emphasis is on diet quality:   Load up on vegetables and fruits - one-half of your plate: Aim for color and variety, and remember that potatoes dont count.   Go for whole grains - one-quarter of your plate: Whole wheat, barley, wheat berries, quinoa, oats, brown rice, and foods made with them. If you want pasta, go with whole wheat pasta.   Protein power - one-quarter of your plate: Fish, chicken, beans, and nuts are all healthy, versatile protein sources. Limit red meat.   The diet, however, does go beyond the plate, offering a few other suggestions.   Use healthy plant oils, such as olive, canola, soy, corn, sunflower and peanut. Check the labels, and avoid partially hydrogenated oil, which have unhealthy trans fats.   If youre thirsty, drink water. Coffee and tea are good in moderation, but skip sugary drinks and limit milk and dairy products to one or two daily servings.   The type of carbohydrate in the diet is more important than the amount. Some sources of carbohydrates, such as vegetables, fruits, whole grains, and beans-are healthier than others.   Finally, stay active  Signed, Berniece Salines, DO  08/10/2022 3:04 PM     [facility]

## 2022-09-09 ENCOUNTER — Encounter: Payer: Self-pay | Admitting: Pharmacist

## 2022-09-09 ENCOUNTER — Ambulatory Visit: Payer: Medicare Other | Attending: Cardiology | Admitting: Pharmacist

## 2022-09-09 VITALS — BP 139/79 | HR 82 | Wt 156.8 lb

## 2022-09-09 DIAGNOSIS — E1100 Type 2 diabetes mellitus with hyperosmolarity without nonketotic hyperglycemic-hyperosmolar coma (NKHHC): Secondary | ICD-10-CM | POA: Diagnosis not present

## 2022-09-09 DIAGNOSIS — I10 Essential (primary) hypertension: Secondary | ICD-10-CM | POA: Diagnosis not present

## 2022-09-09 MED ORDER — RYBELSUS 7 MG PO TABS: 1.0000 | ORAL_TABLET | Freq: Every morning | ORAL | 2 refills | Status: DC

## 2022-09-09 MED ORDER — LISINOPRIL 2.5 MG PO TABS: 2.5000 mg | ORAL_TABLET | Freq: Every day | ORAL | 5 refills | Status: AC

## 2022-09-09 NOTE — Progress Notes (Signed)
Patient ID: Sean Kirby                 DOB: Dec 22, 1955                      MRN: [id number]     HPI: Sean Kirby is a 67 y.o. male referred by Dr. Harriet Masson for DM education. PMH is significant for T2DM, CAD, CABG x4, anxiety, and HTN.   Patient presents today with extensive blood sugar and blood pressure logs. Is confused about medication regimen as it has been changed frequently.   For DM, is on Rybelsus 3mg  daily (from samples and has 1 tablet left) and metformin 850mg  BID.   For HTN is on amlodipine 5mg . Was previously on quinapril/HCTZ however this was discontinued. Started on lisinopril 5mg  which was also discontinued due to hypotension. Metoprolol has also been discontinued.  Checks his BG twice daily. This morning FBG was 143. Dinner last night was beef stew with green beans. Blood pressure this morning was 117/66.  Current DM meds:  Rybelsus 3mg ,  metformin 850 BID  Last A1c: 7.8 on 07/31/22 A1c goal: <7  UACR: 52.9  Wt Readings from Last 3 Encounters:  08/08/22 159 lb (72.1 kg)  01/27/22 153 lb (69.4 kg)  11/15/21 149 lb 7.6 oz (67.8 kg)   BP Readings from Last 3 Encounters:  08/08/22 138/84  01/27/22 122/84  11/15/21 122/70   Pulse Readings from Last 3 Encounters:  08/08/22 91  01/27/22 75  11/15/21 74    Renal function: CrCl cannot be calculated (Patient's most recent lab result is older than the maximum 21 days allowed.).  Past Medical History:  Diagnosis Date   Anxiety    Arthritis    Basal cell carcinoma (BCC) of cheek 2021   Coronary artery disease 2022   Depression    Diabetes mellitus    GERD (gastroesophageal reflux disease)    Glaucoma    Hyperlipidemia    Hypertension    Hypoglycemia    Neuromuscular disorder (HCC)    neuropathy feet    Current Outpatient Medications on File Prior to Visit  Medication Sig Dispense Refill   quinapril-hydrochlorothiazide (ACCURETIC) 20-12.5 MG tablet Take 1 tablet by mouth daily.      Semaglutide (RYBELSUS) 3 MG TABS      acetaminophen (TYLENOL) 500 MG tablet Take 1,000 mg by mouth every 6 (six) hours as needed (for pain.).     alclomethasone (ACLOVATE) 0.05 % cream Apply 1 application topically 2 (two) times daily as needed (skin irritation.).     ALPHAGAN P 0.1 % SOLN      amLODipine (NORVASC) 5 MG tablet Take 5 mg by mouth at bedtime.     aspirin 81 MG EC tablet Take 81 mg by mouth in the morning.     dorzolamide (TRUSOPT) 2 % ophthalmic solution 1 drop 2 (two) times daily.     fexofenadine (ALLEGRA) 60 MG tablet Take 60 mg by mouth 2 (two) times daily.     glucose blood test strip 1 each by Other route as needed. Use as instructed     latanoprost (XALATAN) 0.005 % ophthalmic solution Place 1 drop into both eyes at bedtime.     LORazepam (ATIVAN) 0.5 MG tablet Take 0.5 mg by mouth daily as needed for anxiety.     metFORMIN (GLUCOPHAGE) 850 MG tablet Take 850 mg by mouth 2 (two) times daily with a meal.     Multiple Vitamin (MULTIVITAMIN  WITH MINERALS) TABS Take 2 tablets by mouth in the morning. gummy     polyethylene glycol (MIRALAX / GLYCOLAX) 17 g packet Take 17 g by mouth daily as needed (constipation).     rosuvastatin (CRESTOR) 20 MG tablet Take 1 tablet (20 mg total) by mouth daily. 90 tablet 2   [DISCONTINUED] metoprolol tartrate (LOPRESSOR) 25 MG tablet Take 1 tablet (25 mg total) by mouth 2 (two) times daily. (Patient taking differently: Take 12.5 mg by mouth 2 (two) times daily.) 60 tablet 2   No current facility-administered medications on file prior to visit.    Allergies  Allergen Reactions   Codeine     Patient unsure of reaction. (Familial reaction ONLY patient mother)   Septra [Sulfamethoxazole-Trimethoprim]     Patient unsure of reaction.   Dapagliflozin Other (See Comments)    (Farxiga) Dizziness & low BP   Sertraline Hcl Other (See Comments)    diarrhea   Glimepiride Other (See Comments)    Sugar bottoms out   Simvastatin Diarrhea      Assessment/Plan:  1. T2DM -  Patient's most recent A1c 7.8 which is above goal of <7.0%. Fasting blood sugar also above goal of 80-130 this morning. Since patient's Rybelsus dose will need to be titrated regardless, will start 7mg  once daily. Patient knows to take on empty stomach at least 30 minutes before any other food or medications.   UACR 52.9 and patient not on ACE/ARB. Concern regarding his possible hypotension. Will reduce his amlodipine to 2.5mg  in the evening and restart lisinopril at 2.5mg  once daily for renal protection.  Recheck in 1 month  Continue: Rybelsus Metformin Amlodipine Restart lisinopril Recheck in 4 weeks  Karren Cobble, PharmD, [place], [place], [place], [place], [place], [address] Phone: [phone], Fax: [phone]

## 2022-09-09 NOTE — Patient Instructions (Addendum)
It was nice meeting you today  We would like your A1c to be less than 7.0%  We would like your fasting blood sugar to be between 80-130  Please reduce your amlodipine to 2.5mg  daily (1/2 tablet) and restart your lisinopril at 2.5mg  daily (1/2 tablet). This is to help your blood pressure and protect your kidneys  I will send you the next dose of Rybelsus. It will be one 7mg  tablet in the morning, 30 minutes before any other food or drink  Continue your metformin 850mg  twice a day  Continue healthy eating. Limit carbohydrates and focus on vegetables, lean proteins, and whole grains  See you back in 1 month  Karren Cobble, PharmD, [place], [place], [place] [address], [place], [place], [address] Phone: [phone], Fax: [phone]

## 2022-09-11 ENCOUNTER — Telehealth: Payer: Self-pay | Admitting: Pharmacist

## 2022-09-11 DIAGNOSIS — E1100 Type 2 diabetes mellitus with hyperosmolarity without nonketotic hyperglycemic-hyperosmolar coma (NKHHC): Secondary | ICD-10-CM

## 2022-09-11 MED ORDER — RYBELSUS 7 MG PO TABS: 1.0000 | ORAL_TABLET | Freq: Every morning | ORAL | 2 refills | Status: DC

## 2022-09-11 NOTE — Telephone Encounter (Signed)
PA approved.  Approved. . Authorization Expiration Date: September 11, 2023.

## 2022-09-11 NOTE — Addendum Note (Signed)
Addended by: Rollen Sox on: 09/11/2022 02:43 PM   Modules accepted: Orders

## 2022-09-11 NOTE — Telephone Encounter (Signed)
PA for Rybelsus submitted.  Key: [id number]

## 2022-10-15 ENCOUNTER — Ambulatory Visit: Payer: Medicare Other | Attending: Cardiovascular Disease | Admitting: Pharmacist

## 2022-10-15 ENCOUNTER — Encounter: Payer: Self-pay | Admitting: Pharmacist

## 2022-10-15 VITALS — BP 137/83 | HR 86 | Wt 148.0 lb

## 2022-10-15 DIAGNOSIS — E1100 Type 2 diabetes mellitus with hyperosmolarity without nonketotic hyperglycemic-hyperosmolar coma (NKHHC): Secondary | ICD-10-CM | POA: Insufficient documentation

## 2022-10-15 DIAGNOSIS — Z7984 Long term (current) use of oral hypoglycemic drugs: Secondary | ICD-10-CM

## 2022-10-15 DIAGNOSIS — I10 Essential (primary) hypertension: Secondary | ICD-10-CM | POA: Diagnosis present

## 2022-10-15 NOTE — Patient Instructions (Addendum)
It was good seeing you again  We would like your blood sugar to be between 80-130 in the morning  Since you are not sure if your rash is from the rosuvastatin, you can stop it starting tomorrow through next Monday. Then give [place] a call or send a message with how the rash looks  Continue metformin 850mg  twice a day Continue Rybelsus 7mg  once daily  Try to start checking your blood pressure at home more frequently  We would like it to stay less than 130/80  Continue your amlodipine and lisinopril  I placed an order for your A1c that you can have drawn sometime this Summer  Please call or message with any questions  Laural Golden, PharmD, BCACP, CDCES, CPP [address], [address] [place], [place], [address] Phone: [phone], Fax: [phone]

## 2022-10-15 NOTE — Progress Notes (Signed)
Patient ID: Sean Kirby                 DOB: 1955/11/05                      MRN: [id number]     HPI: Sean Kirby is a 67 y.o. male referred by Dr. Servando Salina for DM education. PMH is significant for T2DM, CAD, CABG x4, anxiety, and HTN.   Patient presents today for DM follow up. Rybelsus increased to 7mg  once daily at last visit.  Brought blood sugar logs and majority of fasting blood sugar readings at goal.  4/29: 100 4/27: 122 4/26: 108 4/25: 118 4/24: 114 4/23: 117  Experiences occasional stomach upset since Rybelsus dose increase. Treats with Tums. Has reduced his appetite considerably and he has lost weight. Does not want to increase dose.  Has not been checking BP at home. At last visit lisinopril was restarted due to proteinuria.   Patient presents today with extensive blood sugar and blood pressure logs. Is confused about medication regimen as it has been changed frequently.   Patient interested in knowing when he can have his A1c rechecked.  Complains of a rash on chest and back which he is concerned is from rosuvastatin. Has seen Derm who diagnosed rosacea. Has f/u next week.  Current DM meds:  Rybelsus 7mg  daily metformin 850 BID  Last A1c: 7.8 on 07/31/22 A1c goal: <7  UACR: 52.9  Wt Readings from Last 3 Encounters:  08/08/22 159 lb (72.1 kg)  01/27/22 153 lb (69.4 kg)  11/15/21 149 lb 7.6 oz (67.8 kg)   BP Readings from Last 3 Encounters:  08/08/22 138/84  01/27/22 122/84  11/15/21 122/70   Pulse Readings from Last 3 Encounters:  08/08/22 91  01/27/22 75  11/15/21 74    Renal function: CrCl cannot be calculated (Patient's most recent lab result is older than the maximum 21 days allowed.).  Past Medical History:  Diagnosis Date   Anxiety    Arthritis    Basal cell carcinoma (BCC) of cheek 2021   Coronary artery disease 2022   Depression    Diabetes mellitus    GERD (gastroesophageal reflux disease)    Glaucoma    Hyperlipidemia     Hypertension    Hypoglycemia    Neuromuscular disorder (HCC)    neuropathy feet    Current Outpatient Medications on File Prior to Visit  Medication Sig Dispense Refill   quinapril-hydrochlorothiazide (ACCURETIC) 20-12.5 MG tablet Take 1 tablet by mouth daily.     Semaglutide (RYBELSUS) 3 MG TABS      acetaminophen (TYLENOL) 500 MG tablet Take 1,000 mg by mouth every 6 (six) hours as needed (for pain.).     alclomethasone (ACLOVATE) 0.05 % cream Apply 1 application topically 2 (two) times daily as needed (skin irritation.).     ALPHAGAN P 0.1 % SOLN      amLODipine (NORVASC) 5 MG tablet Take 5 mg by mouth at bedtime.     aspirin 81 MG EC tablet Take 81 mg by mouth in the morning.     dorzolamide (TRUSOPT) 2 % ophthalmic solution 1 drop 2 (two) times daily.     fexofenadine (ALLEGRA) 60 MG tablet Take 60 mg by mouth 2 (two) times daily.     glucose blood test strip 1 each by Other route as needed. Use as instructed     latanoprost (XALATAN) 0.005 % ophthalmic solution Place 1 drop into  both eyes at bedtime.     LORazepam (ATIVAN) 0.5 MG tablet Take 0.5 mg by mouth daily as needed for anxiety.     metFORMIN (GLUCOPHAGE) 850 MG tablet Take 850 mg by mouth 2 (two) times daily with a meal.     Multiple Vitamin (MULTIVITAMIN WITH MINERALS) TABS Take 2 tablets by mouth in the morning. gummy     polyethylene glycol (MIRALAX / GLYCOLAX) 17 g packet Take 17 g by mouth daily as needed (constipation).     rosuvastatin (CRESTOR) 20 MG tablet Take 1 tablet (20 mg total) by mouth daily. 90 tablet 2   [DISCONTINUED] metoprolol tartrate (LOPRESSOR) 25 MG tablet Take 1 tablet (25 mg total) by mouth 2 (two) times daily. (Patient taking differently: Take 12.5 mg by mouth 2 (two) times daily.) 60 tablet 2   No current facility-administered medications on file prior to visit.    Allergies  Allergen Reactions   Codeine     Patient unsure of reaction. (Familial reaction ONLY patient mother)   Septra  [Sulfamethoxazole-Trimethoprim]     Patient unsure of reaction.   Dapagliflozin Other (See Comments)    (Farxiga) Dizziness & low BP   Sertraline Hcl Other (See Comments)    diarrhea   Glimepiride Other (See Comments)    Sugar bottoms out   Simvastatin Diarrhea     Assessment/Plan:  1. T2DM -    Patient last A1c 7.8 which was above goal of <7.0%. Since increase in Repatha now FBG readings predominantly at goal. Will continue current meds, no increase in Rybelsus dose. Advised patient to contact clinic if he continues to lose weight. Requests A1c order. Advised he can hold rosuvastatin through the weekend to see if rash goes away before recheck with dermatology. Will call on Monday with update.  Continue metformin 850mg  BID Continue Rybelsus 7mg  daily  2. HTN - Initial BP reading 146/90 which decreased after sitting. Antihypertensives were previously d/c due to hypotension so this is unusual for him. Advised to begin checking at home more regularly. Patient voiced understanding.  Continue amlodipine 2.5mg  daily Continue lisinopril 2.5mg  daily    Laural Golden, PharmD, BCACP, CDCES, CPP [address], [address] [place], [place], [address] Phone: [phone], Fax: [phone]

## 2022-10-23 ENCOUNTER — Telehealth: Payer: Self-pay | Admitting: Pharmacist

## 2022-10-23 NOTE — Telephone Encounter (Signed)
Patient called back. Held rosuvastatin for a week but the rash on his chest did not go away. Recommended restarting rosuvastatin. Saw dermatology yesterday who prescribed a daily body wash. Recommended following dermatologys instructions and follow up with them regarding rash.

## 2022-10-27 ENCOUNTER — Other Ambulatory Visit: Payer: Self-pay

## 2022-10-27 DIAGNOSIS — E1100 Type 2 diabetes mellitus with hyperosmolarity without nonketotic hyperglycemic-hyperosmolar coma (NKHHC): Secondary | ICD-10-CM

## 2022-10-28 LAB — HEMOGLOBIN A1C
Est. average glucose Bld gHb Est-mCnc: 154 mg/dL
Hgb A1c MFr Bld: 7 % — ABNORMAL HIGH (ref 4.8–5.6)

## 2022-12-12 ENCOUNTER — Other Ambulatory Visit: Payer: Self-pay | Admitting: Cardiology

## 2022-12-12 DIAGNOSIS — E1100 Type 2 diabetes mellitus with hyperosmolarity without nonketotic hyperglycemic-hyperosmolar coma (NKHHC): Secondary | ICD-10-CM

## 2023-03-20 ENCOUNTER — Other Ambulatory Visit: Payer: Self-pay | Admitting: Cardiology

## 2023-03-20 DIAGNOSIS — E1100 Type 2 diabetes mellitus with hyperosmolarity without nonketotic hyperglycemic-hyperosmolar coma (NKHHC): Secondary | ICD-10-CM

## 2023-03-20 NOTE — Telephone Encounter (Signed)
Rx refill sent to pharmacy. 

## 2023-03-27 ENCOUNTER — Other Ambulatory Visit: Payer: Self-pay | Admitting: Cardiology

## 2023-05-05 ENCOUNTER — Ambulatory Visit: Payer: Medicare Other | Attending: Cardiology | Admitting: Cardiology

## 2023-05-05 ENCOUNTER — Encounter: Payer: Self-pay | Admitting: Cardiology

## 2023-05-05 VITALS — BP 136/74 | HR 89 | Ht 66.0 in | Wt 155.0 lb

## 2023-05-05 DIAGNOSIS — I480 Paroxysmal atrial fibrillation: Secondary | ICD-10-CM | POA: Insufficient documentation

## 2023-05-05 DIAGNOSIS — I10 Essential (primary) hypertension: Secondary | ICD-10-CM | POA: Insufficient documentation

## 2023-05-05 DIAGNOSIS — I2510 Atherosclerotic heart disease of native coronary artery without angina pectoris: Secondary | ICD-10-CM | POA: Insufficient documentation

## 2023-05-05 DIAGNOSIS — I2583 Coronary atherosclerosis due to lipid rich plaque: Secondary | ICD-10-CM | POA: Insufficient documentation

## 2023-05-05 NOTE — Progress Notes (Signed)
Cardiology Office Note:    Date:  05/08/2023   ID:  Sean Kirby, DOB 20-Sep-1955, MRN [id number]  PCP:  Noberto Retort, MD  Cardiologist:  Thomasene Ripple, DO  Electrophysiologist:  None   Referring MD: Noberto Retort, MD   " I am doing ok"  History of Present Illness:    Sean Kirby is a 67 y.o. male with a hx of coronary artery disease status post CABG x4 with LIMA to the LAD, saphenous vein graft to the ramus intermedius, saphenous vein graft to the circumflex marginal, saphenous vein graft to the posterior descending arteries on June 25, 2021, diabetes mellitus type 2, mixed hyperlipidemia, depression, GERD.  At his last visit in February 2024 he was doing well from a cardiovascular standpoint.  He is here today for follow-up visit.  He tells me of his recently diagnosed folliculitis,  and was prescribed clindamycin lotion. He expresses concern about potential side effects of the medication. He also mentions a recent cold and his hope to recover before Thanksgiving. He reports that his physical strength has decreased, making it challenging to work in the yard during the hot summer months. However, he notes that his overall health has significantly improved since his first visit with me.   Past Medical History:  Diagnosis Date   Anxiety    Arthritis    Basal cell carcinoma (BCC) of cheek 2021   Coronary artery disease 2022   Depression    Diabetes mellitus    GERD (gastroesophageal reflux disease)    Glaucoma    Hyperlipidemia    Hypertension    Hypoglycemia    Neuromuscular disorder (HCC)    neuropathy feet    Past Surgical History:  Procedure Laterality Date   CARDIAC CATHETERIZATION     CORONARY ARTERY BYPASS GRAFT N/A 06/25/2021   Procedure: CORONARY ARTERY BYPASS GRAFTING (CABG) X4 USING LEFT INTERNAL MAMMARY ARTERY AND ENDOSCOPICALLY HARVESTED RIGHT GREATER SAPHENOUS VEIN;  Surgeon: Lovett Sox, MD;  Location: [facility] OR;  Service: Open Heart  Surgery;  Laterality: N/A;   ENDOVEIN HARVEST OF GREATER SAPHENOUS VEIN Right 06/25/2021   Procedure: ENDOVEIN HARVEST OF GREATER SAPHENOUS VEIN;  Surgeon: Lovett Sox, MD;  Location: [facility] OR;  Service: Open Heart Surgery;  Laterality: Right;   GLAUCOMA REPAIR  06/16/2004   lazer-low angle -both   HERNIA REPAIR Right 2018   inguinal   INGUINAL HERNIA REPAIR  12/02/2011   Procedure: HERNIA REPAIR INGUINAL ADULT;  Surgeon: Wilmon Arms. Corliss Skains, MD;  Location: [facility];  Service: General;  Laterality: Left;  left inguinal   LEFT HEART CATH AND CORONARY ANGIOGRAPHY N/A 06/18/2021   Procedure: LEFT HEART CATH AND CORONARY ANGIOGRAPHY;  Surgeon: Lyn Records, MD;  Location: [facility];  Service: Cardiovascular;  Laterality: N/A;   SIGMOIDOSCOPY     TONSILLECTOMY AND ADENOIDECTOMY      Current Medications: Current Meds  Medication Sig   acetaminophen (TYLENOL) 500 MG tablet Take 1,000 mg by mouth every 6 (six) hours as needed (for pain.).   alclomethasone (ACLOVATE) 0.05 % cream Apply 1 application topically 2 (two) times daily as needed (skin irritation.).   ALPHAGAN P 0.1 % SOLN    amLODipine (NORVASC) 5 MG tablet Take 2.5 mg by mouth at bedtime.   aspirin 81 MG EC tablet Take 81 mg by mouth in the morning.   clindamycin (CLEOCIN T) 1 % lotion Apply topically daily.   dorzolamide (TRUSOPT) 2 % ophthalmic solution 1 drop  2 (two) times daily.   fexofenadine (ALLEGRA) 60 MG tablet Take 60 mg by mouth 2 (two) times daily.   glucose blood test strip 1 each by Other route as needed. Use as instructed   latanoprost (XALATAN) 0.005 % ophthalmic solution Place 1 drop into both eyes at bedtime.   lisinopril (ZESTRIL) 2.5 MG tablet Take 1 tablet (2.5 mg total) by mouth daily.   LORazepam (ATIVAN) 0.5 MG tablet Take 0.5 mg by mouth daily as needed for anxiety.   metFORMIN (GLUCOPHAGE) 850 MG tablet Take 850 mg by mouth 2 (two) times daily with a meal.   Multiple Vitamin  (MULTIVITAMIN WITH MINERALS) TABS Take 2 tablets by mouth in the morning. gummy   polyethylene glycol (MIRALAX / GLYCOLAX) 17 g packet Take 17 g by mouth daily as needed (constipation).   rosuvastatin (CRESTOR) 20 MG tablet TAKE 1 TABLET(20 MG) BY MOUTH DAILY   Semaglutide (RYBELSUS) 7 MG TABS Take 1 tablet (7 mg total) by mouth every morning.     Allergies:   Codeine, Septra [sulfamethoxazole-trimethoprim], Dapagliflozin, Sertraline hcl, Glimepiride, and Simvastatin   Social History   Socioeconomic History   Marital status: Single    Spouse name: Not on file   Number of children: 0   Years of education: Not on file   Highest education level: Not on file  Occupational History   Occupation: retired  Tobacco Use   Smoking status: Never   Smokeless tobacco: Never  Vaping Use   Vaping status: Never Used  Substance and Sexual Activity   Alcohol use: No   Drug use: No   Sexual activity: Not Currently  Other Topics Concern   Not on file  Social History Narrative   Right Handed   Lives in a one story home   Does not drink caffeine   Social Determinants of Health   Financial Resource Strain: Low Risk  (07/18/2021)   Overall Financial Resource Strain (CARDIA)    Difficulty of Paying Living Expenses: Not very hard  Food Insecurity: No Food Insecurity (07/18/2021)   Hunger Vital Sign    Worried About Running Out of Food in the Last Year: Never true    Ran Out of Food in the Last Year: Never true  Transportation Needs: No Transportation Needs (07/18/2021)   PRAPARE - Administrator, Civil Service (Medical): No    Lack of Transportation (Non-Medical): No  Physical Activity: Not on file  Stress: Not on file  Social Connections: Not on file     Family History: The patient's family history includes Heart disease in his maternal aunt, maternal grandmother, and mother. There is no history of Colon cancer.  ROS:   Review of Systems  Constitution: Negative for decreased  appetite, fever and weight gain.  HENT: Negative for congestion, ear discharge, hoarse voice and sore throat.   Eyes: Negative for discharge, redness, vision loss in right eye and visual halos.  Cardiovascular: Negative for chest pain, dyspnea on exertion, leg swelling, orthopnea and palpitations.  Respiratory: Negative for cough, hemoptysis, shortness of breath and snoring.   Endocrine: Negative for heat intolerance and polyphagia.  Hematologic/Lymphatic: Negative for bleeding problem. Does not bruise/bleed easily.  Skin: Negative for flushing, nail changes, rash and suspicious lesions.  Musculoskeletal: Negative for arthritis, joint pain, muscle cramps, myalgias, neck pain and stiffness.  Gastrointestinal: Negative for abdominal pain, bowel incontinence, diarrhea and excessive appetite.  Genitourinary: Negative for decreased libido, genital sores and incomplete emptying.  Neurological: Negative for brief  paralysis, focal weakness, headaches and loss of balance.  Psychiatric/Behavioral: Negative for altered mental status, depression and suicidal ideas.  Allergic/Immunologic: Negative for HIV exposure and persistent infections.    EKGs/Labs/Other Studies Reviewed:    The following studies were reviewed today:   EKG:  The ekg ordered today demonstrates normal sinus rhythm, HR 89 bpm  Recent Labs: No results found for requested labs within last 365 days.  Recent Lipid Panel No results found for: "CHOL", "TRIG", "HDL", "CHOLHDL", "VLDL", "LDLCALC", "LDLDIRECT"  Physical Exam:    VS:  BP 136/74 (BP Location: Left Arm, Patient Position: Sitting, Cuff Size: Normal)   Pulse 89   Ht 5\' 6"  (1.676 m)   Wt 155 lb (70.3 kg)   SpO2 98%   BMI 25.02 kg/m     Wt Readings from Last 3 Encounters:  05/05/23 155 lb (70.3 kg)  10/15/22 148 lb (67.1 kg)  09/09/22 156 lb 12.8 oz (71.1 kg)     GEN: Well nourished, well developed in no acute distress HEENT: Normal NECK: No JVD; No carotid  bruits LYMPHATICS: No lymphadenopathy CARDIAC: S1S2 noted,RRR, no murmurs, rubs, gallops RESPIRATORY:  Clear to auscultation without rales, wheezing or rhonchi  ABDOMEN: Soft, non-tender, non-distended, +bowel sounds, no guarding. EXTREMITIES: No edema, No cyanosis, no clubbing MUSCULOSKELETAL:  No deformity  SKIN: Warm and dry NEUROLOGIC:  Alert and oriented x 3, non-focal PSYCHIATRIC:  Normal affect, good insight  ASSESSMENT:    1. Coronary artery disease involving native coronary artery of native heart without angina pectoris   2. Primary hypertension   3. Post op PAF (paroxysmal atrial fibrillation) (HCC)   4. Coronary atherosclerosis due to lipid rich plaque     PLAN:    CAD - no anginal symptoms. Will continue current regimen.  HLN - continue current dose of statin.  HTN- blood pressure is at target.  DM - managed by his pcp.   Folliculitis Persistent rash diagnosed as folliculitis by dermatologist. Discussed the benefits and potential side effects of topical Clindamycin. -Start topical Clindamycin as prescribed by dermatologist.  General Health Maintenance -Continue Aspirin. -Follow-up in 9 months or sooner if any issues arise.  The patient is in agreement with the above plan. The patient left the office in stable condition.  The patient will follow up in 12 months.   Medication Adjustments/Labs and Tests Ordered: Current medicines are reviewed at length with the patient today.  Concerns regarding medicines are outlined above.  Orders Placed This Encounter  Procedures   EKG 12-Lead   No orders of the defined types were placed in this encounter.   Patient Instructions  Medication Instructions:  Your physician recommends that you continue on your current medications as directed. Please refer to the Current Medication list given to you today.  *If you need a refill on your cardiac medications before your next appointment, please call your  pharmacy.*  Follow-Up: At [facility], you and your health needs are our priority.  As part of our continuing mission to provide you with exceptional heart care, we have created designated Provider Care Teams.  These Care Teams include your primary Cardiologist (physician) and Advanced Practice Providers (APPs -  Physician Assistants and Nurse Practitioners) who all work together to provide you with the care you need, when you need it.  Your next appointment:   9 month(s)  Provider:   Thomasene Ripple, DO       Adopting a Healthy Lifestyle.  Know what a healthy weight is for  you (roughly BMI <25) and aim to maintain this   Aim for 7+ servings of fruits and vegetables daily   65-80+ fluid ounces of water or unsweet tea for healthy kidneys   Limit to max 1 drink of alcohol per day; avoid smoking/tobacco   Limit animal fats in diet for cholesterol and heart health - choose grass fed whenever available   Avoid highly processed foods, and foods high in saturated/trans fats   Aim for low stress - take time to unwind and care for your mental health   Aim for 150 min of moderate intensity exercise weekly for heart health, and weights twice weekly for bone health   Aim for 7-9 hours of sleep daily   When it comes to diets, agreement about the perfect plan isnt easy to find, even among the experts. Experts at the [facility] [employer] developed an idea known as the Healthy Eating Plate. Just imagine a plate divided into logical, healthy portions.   The emphasis is on diet quality:   Load up on vegetables and fruits - one-half of your plate: Aim for color and variety, and remember that potatoes dont count.   Go for whole grains - one-quarter of your plate: Whole wheat, barley, wheat berries, quinoa, oats, brown rice, and foods made with them. If you want pasta, go with whole wheat pasta.   Protein power - one-quarter of your plate: Fish, chicken, beans, and nuts are all  healthy, versatile protein sources. Limit red meat.   The diet, however, does go beyond the plate, offering a few other suggestions.   Use healthy plant oils, such as olive, canola, soy, corn, sunflower and peanut. Check the labels, and avoid partially hydrogenated oil, which have unhealthy trans fats.   If youre thirsty, drink water. Coffee and tea are good in moderation, but skip sugary drinks and limit milk and dairy products to one or two daily servings.   The type of carbohydrate in the diet is more important than the amount. Some sources of carbohydrates, such as vegetables, fruits, whole grains, and beans-are healthier than others.   Finally, stay active  Signed, Thomasene Ripple, DO  05/08/2023 8:01 PM    [facility]

## 2023-05-05 NOTE — Patient Instructions (Signed)
Medication Instructions:  Your physician recommends that you continue on your current medications as directed. Please refer to the Current Medication list given to you today.  *If you need a refill on your cardiac medications before your next appointment, please call your pharmacy.*  Follow-Up: At [facility], you and your health needs are our priority.  As part of our continuing mission to provide you with exceptional heart care, we have created designated Provider Care Teams.  These Care Teams include your primary Cardiologist (physician) and Advanced Practice Providers (APPs -  Physician Assistants and Nurse Practitioners) who all work together to provide you with the care you need, when you need it.  Your next appointment:   9 month(s)  Provider:   Thomasene Ripple, DO

## 2023-08-03 ENCOUNTER — Ambulatory Visit: Payer: Medicare Other | Admitting: Dermatology

## 2023-08-03 ENCOUNTER — Encounter: Payer: Self-pay | Admitting: Dermatology

## 2023-08-03 VITALS — BP 135/79 | HR 97

## 2023-08-03 DIAGNOSIS — L503 Dermatographic urticaria: Secondary | ICD-10-CM

## 2023-08-03 MED ORDER — PIMECROLIMUS 1 % EX CREA
TOPICAL_CREAM | CUTANEOUS | 5 refills | Status: AC
Start: 2023-08-03 — End: 2023-08-04

## 2023-08-03 NOTE — Patient Instructions (Addendum)
 Dear Mr. Manna,  Thank you for visiting my office today. Your dedication to addressing your dermatological health concerns is greatly appreciated. Here is a summary of the key instructions and recommendations from today's consultation:  Working Diagnosis: Physical Urticaria (Pressure and Heat induces)  Medications:   Discontinue Oral Clindamycin: It is not suitable for your condition.   Pimecrolimus Cream: Start using once or twice daily as needed. This can be used every day without the need for breaks.   CeraVe Anti-Itch Cream: Continue using twice daily. Mix with the new prescription cream at night.   Lorazepam: Temporarily stop taking for the next 2-3 nights to see if it affects your symptoms. If stopping does not alleviate symptoms, begin taking Xyzal at night while continuing Allegra in the morning.  Lifestyle Adjustments:   Dry-Fit Shirts: Consider wearing shirts that wick moisture away to keep the skin cool.   Monitor Sitting Position: Especially when symptoms typically occur, to reduce physical pressure that may trigger your rash.  Follow-Up Care:   Next Appointment: Schedule a follow-up appointment in 8 weeks to assess progress.   Allergist Referral: Consider a referral to an allergist for further testing and potential treatment with Xolair if chronic urticaria is confirmed.   Please follow these instructions carefully and do not hesitate to contact the office if you have any questions or concerns. We are here to support you in managing your health.  Best regards,  Dr. [facility]   Important Information  Due to recent changes in healthcare laws, you may see results of your pathology and/or laboratory studies on MyChart before the doctors have had a chance to review them. We understand that in some cases there may be results that are confusing or concerning to you. Please understand that not all results are received at the same time and often the doctors may  need to interpret multiple results in order to provide you with the best plan of care or course of treatment. Therefore, we ask that you please give [place] 2 business days to thoroughly review all your results before contacting the office for clarification. Should we see a critical lab result, you will be contacted sooner.   If You Need Anything After Your Visit  If you have any questions or concerns for your doctor, please call our main line at [phone] If no one answers, please leave a voicemail as directed and we will return your call as soon as possible. Messages left after 4 pm will be answered the following business day.   You may also send [place] a message via MyChart. We typically respond to MyChart messages within 1-2 business days.  For prescription refills, please ask your pharmacy to contact our office. Our fax number is [phone].  If you have an urgent issue when the clinic is closed that cannot wait until the next business day, you can page your doctor at the number below.    Please note that while we do our best to be available for urgent issues outside of office hours, we are not available 24/7.   If you have an urgent issue and are unable to reach [place], you may choose to seek medical care at your doctor's office, retail clinic, urgent care center, or emergency room.  If you have a medical emergency, please immediately call 911 or go to the emergency department. In the event of inclement weather, please call our main line at [phone] for an update on the status of any delays or  closures.  Dermatology Medication Tips: Please keep the boxes that topical medications come in in order to help keep track of the instructions about where and how to use these. Pharmacies typically print the medication instructions only on the boxes and not directly on the medication tubes.   If your medication is too expensive, please contact our office at [phone] or send [place] a message through  MyChart.   We are unable to tell what your co-pay for medications will be in advance as this is different depending on your insurance coverage. However, we may be able to find a substitute medication at lower cost or fill out paperwork to get insurance to cover a needed medication.   If a prior authorization is required to get your medication covered by your insurance company, please allow [place] 1-2 business days to complete this process.  Drug prices often vary depending on where the prescription is filled and some pharmacies may offer cheaper prices.  The website www.goodrx.com contains coupons for medications through different pharmacies. The prices here do not account for what the cost may be with help from insurance (it may be cheaper with your insurance), but the website can give you the price if you did not use any insurance.  - You can print the associated coupon and take it with your prescription to the pharmacy.  - You may also stop by our office during regular business hours and pick up a GoodRx coupon card.  - If you need your prescription sent electronically to a different pharmacy, notify our office through [facility] or by phone at [phone]

## 2023-08-03 NOTE — Progress Notes (Signed)
   New Patient Visit   Subjective  Sean Kirby is a 68 y.o. male who presents for the following: burning and itching rash on back. Cerave helps temporarily.  Dx with folliculitis with Dr. Wallace Cullens. Took clindamycin BID for 2 month but didn't see any improvement.       The following portions of the chart were reviewed this encounter and updated as appropriate: medications, allergies, medical history  Review of Systems:  No other skin or systemic complaints except as noted in HPI or Assessment and Plan.  Objective  Well appearing patient in no apparent distress; mood and affect are within normal limits.  A focused examination was performed of the following areas:  Back  Relevant exam findings are noted in the Assessment and Plan.    Assessment & Plan   Physical Urticaria (+ Dermatographism) Assessment: Patient experiences a burning rash daily at 8 PM, primarily on the upper shoulder blades and mid-back, occasionally extending to the lower back. Positive dermographism test indicating physical urticaria. The condition is linked to mast cells releasing histamine in response to physical touch, pressure, or allergens. Symptoms' timing aligns with the patient's habit of watching TV in a recliner at 8 PM. Notable history includes hives, current use of Allegra for sinus issues, and recent eye surgery at [facility], which may have contributed to nerve irritation in the neck or upper back.  Pt also admits to taking Lorazepam around 8pm every night and we discussed this being a possible precipitator of the itch  Plan:   Prescribe pimecrolimus cream (or tacrolimus ointment if insurance formulary prefers) for daily use, up to twice daily as needed.   Continue CeraVe Anti-Itch cream twice daily, mixing with the new prescription cream at night.   Switch from Allegra to Xyzal at night if stopping lorazepam doesn't alleviate symptoms.   Trial discontinuation of lorazepam for 2-3  nights to assess potential correlation with symptoms.   Recommend wearing moisture-wicking dry fit shirts.   Offer referral to an allergist for further testing and potential Xolair treatment.   Schedule a follow-up appointment in 8 weeks.     DERMATOGRAPHIC URTICARIA   Related Procedures Ambulatory referral to Allergy Related Medications pimecrolimus (ELIDEL) 1 % cream Apply topically 1 day or 1 dose for 1 dose.   Return in about 8 weeks (around 09/28/2023).  Dominga Ferry, Surg Tech III, am acting as scribe for [employer], DO.   Documentation: I have reviewed the above documentation for accuracy and completeness, and I agree with the above.  Langston Reusing, DO

## 2023-08-13 ENCOUNTER — Other Ambulatory Visit: Payer: Self-pay

## 2023-08-13 MED ORDER — TACROLIMUS 0.1 % EX OINT: TOPICAL_OINTMENT | Freq: Two times a day (BID) | CUTANEOUS | 4 refills | Status: DC

## 2023-08-13 NOTE — Progress Notes (Unsigned)
 Insurance would not cover cream

## 2023-08-31 ENCOUNTER — Other Ambulatory Visit (HOSPITAL_COMMUNITY): Payer: Self-pay

## 2023-08-31 ENCOUNTER — Telehealth: Payer: Self-pay | Admitting: Pharmacy Technician

## 2023-08-31 NOTE — Telephone Encounter (Signed)
 Pharmacy Patient Advocate Encounter   Received notification from CoverMyMeds that prior authorization for Rybelsus is required/requested.   Insurance verification completed.   The patient is insured through  [employer]  .   Per test claim: Refill too soon. PA is not needed at this time. Medication was filled 08/19/23. Next eligible fill date is 09/14/23.

## 2023-09-01 NOTE — Progress Notes (Unsigned)
 New Patient Note  RE: Sean Kirby MRN: [id number] DOB: 1955/09/27 Date of Office Visit: 09/02/2023  Consult requested by: Terri Piedra, DO Primary care provider: Noberto Retort, MD  Chief Complaint: Rash (No known cause x 2 years), Urticaria, and Pruritus (With burning sensation )  History of Present Illness: I had the pleasure of seeing Sean Kirby for initial evaluation at the [facility] on 09/02/2023. He is a 68 y.o. male, who is referred here by Dr. Onalee Hua (dermatology) for the evaluation of urticaria.  Discussed the use of AI scribe software for clinical note transcription with the patient, who gave verbal consent to proceed.    He has been experiencing a persistent rash on his back for the past two years, characterized by red spots and bumps that appear when pressure is applied or at night. The rash reacts by turning red and hiving up when touched, typically lasting for two to three hours and alleviated by applying Cerave cream. It occurs daily and does not leave any bruise marks.  He has a history of open heart surgery in January 2023. He has been diagnosed with rosacea and folliculitis initially but treatments such as clindamycin have been ineffective. He recalls experiencing hives during his college years, which he attributes to stress.   Most recently saw dermatology and diagnosed with physical urticaria.   He currently uses topical treatments, including Cerave cream and cortisone ten, and takes Allegra 12-hour once daily. He has not been prescribed oral steroids for this.   No fever, chills, or other systemic symptoms. No issues with eating, bathroom habits, or breathing. He has no known allergies to foods. He has no history of asthma or inhaler use and no significant family history of allergies, eczema, or hives.     Rash started about 2 years ago. Mainly occurs on his back. Describes them as itchy, red, raised. Individual rashes lasts  about a few hours. No ecchymosis upon resolution. Associated symptoms include: none.  Frequency of episodes: daily. Suspected triggers are pressure. Denies any fevers, chills, changes in medications, foods, personal care products or recent infections.   He has tried the following therapies: topical hydrocortisone cream with some benefit. Systemic steroids: none. Currently on allegra once a day.  Previous work up includes: saw dermatology and diagnosed with physical urticaria.  Previous history of rash/hives: in college - most likely stress induced. Patient is up to date with the following cancer screening tests: physical exam, colonoscopy.  08/03/2023 derm visit: "Assessment: Patient experiences a burning rash daily at 8 PM, primarily on the upper shoulder blades and mid-back, occasionally extending to the lower back. Positive dermographism test indicating physical urticaria. The condition is linked to mast cells releasing histamine in response to physical touch, pressure, or allergens. Symptoms' timing aligns with the patient's habit of watching TV in a recliner at 8 PM. Notable history includes hives, current use of Allegra for sinus issues, and recent eye surgery at [facility], which may have contributed to nerve irritation in the neck or upper back.  Pt also admits to taking Lorazepam around 8pm every night and we discussed this being a possible precipitator of the itch   Plan:   Prescribe pimecrolimus cream (or tacrolimus ointment if insurance formulary prefers) for daily use, up to twice daily as needed.   Continue CeraVe Anti-Itch cream twice daily, mixing with the new prescription cream at night.   Switch from Allegra to Xyzal at night if stopping  lorazepam doesn't alleviate symptoms.   Trial discontinuation of lorazepam for 2-3 nights to assess potential correlation with symptoms.   Recommend wearing moisture-wicking dry fit shirts.   Offer referral to an allergist for further  testing and potential Xolair treatment.   Schedule a follow-up appointment in 8 weeks."  Assessment and Plan: Sean Kirby is a 68 y.o. male with: Physical urticaria Urticaria Daily x 2 yrs mainly pressure induced in the back. Currently on allegra once a day with minimal benefit. H/o hives in college during stress. Start zyrtec (cetirizine) 10mg  OR allegra (fexofenadine) 180mg  twice a day. If symptoms are not controlled or causes drowsiness let us know. Start Pepcid (famotidine) 20mg  twice a day.  Avoid the following potential triggers: alcohol, tight clothing, NSAIDs, hot showers and getting overheated. See below for proper skin care.  Get bloodwork to rule systemic issues.   Return in about 2 months (around 11/02/2023).  Meds ordered this encounter  Medications   famotidine (PEPCID) 20 MG tablet    Sig: Take 1 tablet (20 mg total) by mouth 2 (two) times daily.    Dispense:  60 tablet    Refill:  3   Lab Orders         Alpha-Gal Panel         ANA, IFA (with reflex)         C3 and C4         CBC with Differential/Platelet         Chronic Urticaria         Comprehensive metabolic panel         C-reactive protein         Protein electrophoresis, serum         Protein Electrophoresis, Urine Rflx.         Sedimentation rate         Thyroid Cascade Profile         Tryptase      Other allergy screening: Asthma: no Rhino conjunctivitis: no Food allergy: no Medication allergy: mostly some side effects.  Hymenoptera allergy: no Eczema:no History of recurrent infections suggestive of immunodeficency: no  Diagnostics: None.   Past Medical History: Patient Active Problem List   Diagnosis Date Noted   Coronary atherosclerosis 09/19/2021   Hx of CABG 07/22/2021   Pain of right lower extremity 07/17/2021   S/P CABG (coronary artery bypass graft) 07/17/2021   Type 2 diabetes mellitus with hyperosmolarity without coma, without long-term current use of insulin (HCC) 07/17/2021    Post op PAF (paroxysmal atrial fibrillation) (HCC) 07/17/2021   S/P CABG x 4 06/25/2021   CAD (coronary artery disease) 06/18/2021   Hyperlipidemia 06/18/2021   Diabetes mellitus type II, controlled (HCC) 06/18/2021   Primary hypertension    Anxiety 10/10/2016   Pars defect of lumbar spine 08/12/2016   Spondylolisthesis at L5-S1 level 08/12/2016   Past Medical History:  Diagnosis Date   Anxiety    Arthritis    Basal cell carcinoma (BCC) of cheek 2021   Coronary artery disease 2022   Depression    Diabetes mellitus    GERD (gastroesophageal reflux disease)    Glaucoma    Hyperlipidemia    Hypertension    Hypoglycemia    Neuromuscular disorder (HCC)    neuropathy feet   Urticaria    Past Surgical History: Past Surgical History:  Procedure Laterality Date   CARDIAC CATHETERIZATION     CORONARY ARTERY BYPASS GRAFT N/A 06/25/2021   Procedure: CORONARY ARTERY BYPASS  GRAFTING (CABG) X4 USING LEFT INTERNAL MAMMARY ARTERY AND ENDOSCOPICALLY HARVESTED RIGHT GREATER SAPHENOUS VEIN;  Surgeon: Lovett Sox, MD;  Location: [facility] OR;  Service: Open Heart Surgery;  Laterality: N/A;   ENDOVEIN HARVEST OF GREATER SAPHENOUS VEIN Right 06/25/2021   Procedure: ENDOVEIN HARVEST OF GREATER SAPHENOUS VEIN;  Surgeon: Lovett Sox, MD;  Location: [facility] OR;  Service: Open Heart Surgery;  Laterality: Right;   GLAUCOMA REPAIR  06/16/2004   lazer-low angle -both   HERNIA REPAIR Right 2018   inguinal   INGUINAL HERNIA REPAIR  12/02/2011   Procedure: HERNIA REPAIR INGUINAL ADULT;  Surgeon: Wilmon Arms. Corliss Skains, MD;  Location: [facility];  Service: General;  Laterality: Left;  left inguinal   LEFT HEART CATH AND CORONARY ANGIOGRAPHY N/A 06/18/2021   Procedure: LEFT HEART CATH AND CORONARY ANGIOGRAPHY;  Surgeon: Lyn Records, MD;  Location: [facility] INVASIVE [facility];  Service: Cardiovascular;  Laterality: N/A;   SIGMOIDOSCOPY     TONSILLECTOMY AND ADENOIDECTOMY     Medication List:  Current  Outpatient Medications  Medication Sig Dispense Refill   acetaminophen (TYLENOL) 500 MG tablet Take 1,000 mg by mouth every 6 (six) hours as needed (for pain.).     ALPHAGAN P 0.1 % SOLN      amLODipine (NORVASC) 5 MG tablet Take 2.5 mg by mouth at bedtime.     aspirin 81 MG EC tablet Take 81 mg by mouth in the morning.     dorzolamide (TRUSOPT) 2 % ophthalmic solution 1 drop 2 (two) times daily.     famotidine (PEPCID) 20 MG tablet Take 1 tablet (20 mg total) by mouth 2 (two) times daily. 60 tablet 3   fexofenadine (ALLEGRA) 60 MG tablet Take 60 mg by mouth 2 (two) times daily.     glucose blood test strip 1 each by Other route as needed. Use as instructed     latanoprost (XALATAN) 0.005 % ophthalmic solution Place 1 drop into both eyes at bedtime.     lisinopril (ZESTRIL) 2.5 MG tablet Take 1 tablet (2.5 mg total) by mouth daily. 30 tablet 5   LORazepam (ATIVAN) 0.5 MG tablet Take 0.5 mg by mouth daily as needed for anxiety.     metFORMIN (GLUCOPHAGE) 850 MG tablet Take 850 mg by mouth 2 (two) times daily with a meal.     Multiple Vitamin (MULTIVITAMIN WITH MINERALS) TABS Take 2 tablets by mouth in the morning. gummy     polyethylene glycol (MIRALAX / GLYCOLAX) 17 g packet Take 17 g by mouth daily as needed (constipation).     rosuvastatin (CRESTOR) 20 MG tablet TAKE 1 TABLET(20 MG) BY MOUTH DAILY 90 tablet 2   Semaglutide (RYBELSUS) 7 MG TABS Take 1 tablet (7 mg total) by mouth every morning. 30 tablet 5   No current facility-administered medications for this visit.   Allergies: Allergies  Allergen Reactions   Codeine     Patient unsure of reaction. (Familial reaction ONLY patient mother)   Septra [Sulfamethoxazole-Trimethoprim]     Patient unsure of reaction.   Dapagliflozin Other (See Comments)    (Farxiga) Dizziness & low BP   Sertraline Hcl Other (See Comments)    diarrhea   Glimepiride Other (See Comments)    Sugar bottoms out   Simvastatin Diarrhea   Social  History: Social History   Socioeconomic History   Marital status: Single    Spouse name: Not on file   Number of children: 0   Years of education: Not  on file   Highest education level: Not on file  Occupational History   Occupation: retired  Tobacco Use   Smoking status: Never    Passive exposure: Past   Smokeless tobacco: Never  Vaping Use   Vaping status: Never Used  Substance and Sexual Activity   Alcohol use: No   Drug use: No   Sexual activity: Not Currently  Other Topics Concern   Not on file  Social History Narrative   Right Handed   Lives in a one story home   Does not drink caffeine   Social Drivers of Corporate investment banker Strain: Low Risk  (07/18/2021)   Overall Financial Resource Strain (CARDIA)    Difficulty of Paying Living Expenses: Not very hard  Food Insecurity: No Food Insecurity (07/18/2021)   Hunger Vital Sign    Worried About Running Out of Food in the Last Year: Never true    Ran Out of Food in the Last Year: Never true  Transportation Needs: No Transportation Needs (07/18/2021)   PRAPARE - Administrator, Civil Service (Medical): No    Lack of Transportation (Non-Medical): No  Physical Activity: Not on file  Stress: Not on file  Social Connections: Not on file   Lives in a house. Smoking: denies Occupation: retired  Landscape architect HistorySurveyor, minerals in the house: no Engineer, civil (consulting) in the family room: yes Carpet in the bedroom: yes Heating: gas Cooling: central Pet: no  Family History: Family History  Problem Relation Age of Onset   Angioedema Mother    Heart disease Mother    Heart disease Maternal Aunt    Heart disease Maternal Grandmother    Colon cancer Neg Hx    Problem                               Relation Hives     no  Review of Systems  Constitutional:  Negative for appetite change, chills, fever and unexpected weight change.  HENT:  Negative for congestion and rhinorrhea.   Eyes:  Negative for  itching.  Respiratory:  Negative for cough, chest tightness, shortness of breath and wheezing.   Cardiovascular:  Negative for chest pain.  Gastrointestinal:  Negative for abdominal pain.  Genitourinary:  Negative for difficulty urinating.  Skin:  Positive for rash.  Neurological:  Negative for headaches.    Objective: BP 130/70   Pulse 93   Temp 98.1 F (36.7 C)   Ht 5' 8.11" (1.73 m)   Wt 154 lb (69.9 kg)   SpO2 96%   BMI 23.34 kg/m  Body mass index is 23.34 kg/m. Physical Exam Vitals and nursing note reviewed.  Constitutional:      Appearance: Normal appearance. He is well-developed.  HENT:     Head: Normocephalic and atraumatic.     Right Ear: Tympanic membrane and external ear normal.     Left Ear: Tympanic membrane and external ear normal.     Nose: Nose normal.     Mouth/Throat:     Mouth: Mucous membranes are moist.     Pharynx: Oropharynx is clear.  Eyes:     Conjunctiva/sclera: Conjunctivae normal.  Cardiovascular:     Rate and Rhythm: Normal rate and regular rhythm.     Heart sounds: Normal heart sounds. No murmur heard.    No friction rub. No gallop.  Pulmonary:     Effort: Pulmonary effort is normal.  Breath sounds: Normal breath sounds. No wheezing, rhonchi or rales.  Musculoskeletal:     Cervical back: Neck supple.  Skin:    General: Skin is warm.     Findings: No rash.  Neurological:     Mental Status: He is alert and oriented to person, place, and time.  Psychiatric:        Behavior: Behavior normal.   The plan was reviewed with the patient/family, and all questions/concerned were addressed.  It was my pleasure to see Theoden today and participate in his care. Please feel free to contact me with any questions or concerns.  Sincerely,  Wyline Mood, DO Allergy & Immunology  [facility] office: [phone] [facility] office: [phone]

## 2023-09-02 ENCOUNTER — Other Ambulatory Visit: Payer: Self-pay

## 2023-09-02 ENCOUNTER — Ambulatory Visit (INDEPENDENT_AMBULATORY_CARE_PROVIDER_SITE_OTHER): Payer: Self-pay | Admitting: Allergy

## 2023-09-02 ENCOUNTER — Encounter: Payer: Self-pay | Admitting: Allergy

## 2023-09-02 VITALS — BP 130/70 | HR 93 | Temp 98.1°F | Ht 68.11 in | Wt 154.0 lb

## 2023-09-02 DIAGNOSIS — L508 Other urticaria: Secondary | ICD-10-CM | POA: Diagnosis not present

## 2023-09-02 DIAGNOSIS — L509 Urticaria, unspecified: Secondary | ICD-10-CM

## 2023-09-02 MED ORDER — FAMOTIDINE 20 MG PO TABS
20.0000 mg | ORAL_TABLET | Freq: Two times a day (BID) | ORAL | 3 refills | Status: DC
Start: 2023-09-02 — End: 2023-10-28

## 2023-09-02 NOTE — Patient Instructions (Addendum)
 Based on clinical history it sounds like you have physical urticaria.  Start zyrtec (cetirizine) 10mg  OR allegra (fexofenadine) 180mg  twice a day. If symptoms are not controlled or causes drowsiness let us know. Start Pepcid (famotidine) 20mg  twice a day.  Avoid the following potential triggers: alcohol, tight clothing, NSAIDs, hot showers and getting overheated. See below for proper skin care.   Get bloodwork to rule out any systemic issues.  We are ordering labs, so please allow 1-2 weeks for the results to come back. With the newly implemented Cures Act, the labs might be visible to you at the same time that they become visible to me. However, I will not address the results until all of the results are back, so please be patient.   Return in about 2 months (around 11/02/2023). Or sooner if needed.  Skin care recommendations  Bath time: Always use lukewarm water. AVOID very hot or cold water. Keep bathing time to 5-10 minutes. Do NOT use bubble bath. Use a mild soap and use just enough to wash the dirty areas. Do NOT scrub skin vigorously.  After bathing, pat dry your skin with a towel. Do NOT rub or scrub the skin.  Moisturizers and prescriptions:  ALWAYS apply moisturizers immediately after bathing (within 3 minutes). This helps to lock-in moisture. Use the moisturizer several times a day over the whole body. Good summer moisturizers include: Aveeno, CeraVe, Cetaphil. Good winter moisturizers include: Aquaphor, Vaseline, Cerave, Cetaphil, Eucerin, Vanicream. When using moisturizers along with medications, the moisturizer should be applied about one hour after applying the medication to prevent diluting effect of the medication or moisturize around where you applied the medications. When not using medications, the moisturizer can be continued twice daily as maintenance.  Laundry and clothing: Avoid laundry products with added color or perfumes. Use unscented hypo-allergenic laundry  products such as Tide free, Cheer free & gentle, and All free and clear.  If the skin still seems dry or sensitive, you can try double-rinsing the clothes. Avoid tight or scratchy clothing such as wool. Do not use fabric softeners or dyer sheets.

## 2023-09-08 LAB — PROTEIN ELECTROPHORESIS, URINE REFLEX

## 2023-09-11 LAB — COMPREHENSIVE METABOLIC PANEL WITH GFR
ALT: 22 IU/L (ref 0–44)
AST: 31 IU/L (ref 0–40)
Albumin: 4.8 g/dL (ref 3.9–4.9)
Alkaline Phosphatase: 101 IU/L (ref 44–121)
BUN/Creatinine Ratio: 9 — ABNORMAL LOW (ref 10–24)
BUN: 8 mg/dL (ref 8–27)
Bilirubin Total: 0.5 mg/dL (ref 0.0–1.2)
CO2: 25 mmol/L (ref 20–29)
Calcium: 10 mg/dL (ref 8.6–10.2)
Chloride: 96 mmol/L (ref 96–106)
Creatinine, Ser: 0.85 mg/dL (ref 0.76–1.27)
Globulin, Total: 2.2 g/dL (ref 1.5–4.5)
Glucose: 119 mg/dL — ABNORMAL HIGH (ref 70–99)
Potassium: 4.4 mmol/L (ref 3.5–5.2)
Sodium: 135 mmol/L (ref 134–144)
Total Protein: 7 g/dL (ref 6.0–8.5)
eGFR: 94 mL/min/{1.73_m2} (ref >59)

## 2023-09-11 LAB — CBC WITH DIFFERENTIAL/PLATELET
Basophils Absolute: 0 10*3/uL (ref 0.0–0.2)
Basos: 0 %
EOS (ABSOLUTE): 0.5 10*3/uL — ABNORMAL HIGH (ref 0.0–0.4)
Eos: 9 %
Hematocrit: 43.4 % (ref 37.5–51.0)
Hemoglobin: 15 g/dL (ref 13.0–17.7)
Immature Grans (Abs): 0 10*3/uL (ref 0.0–0.1)
Immature Granulocytes: 0 %
Lymphocytes Absolute: 1.2 10*3/uL (ref 0.7–3.1)
Lymphs: 22 %
MCH: 32.1 pg (ref 26.6–33.0)
MCHC: 34.6 g/dL (ref 31.5–35.7)
MCV: 93 fL (ref 79–97)
Monocytes Absolute: 0.5 10*3/uL (ref 0.1–0.9)
Monocytes: 9 %
Neutrophils Absolute: 3.3 10*3/uL (ref 1.4–7.0)
Neutrophils: 60 %
Platelets: 224 10*3/uL (ref 150–450)
RBC: 4.68 x10E6/uL (ref 4.14–5.80)
RDW: 12 % (ref 11.6–15.4)
WBC: 5.6 10*3/uL (ref 3.4–10.8)

## 2023-09-11 LAB — THYROID CASCADE PROFILE: TSH: 2.44 u[IU]/mL (ref 0.450–4.500)

## 2023-09-11 LAB — PROTEIN ELECTROPHORESIS, SERUM
A/G Ratio: 1.3 (ref 0.7–1.7)
Albumin ELP: 4 g/dL (ref 2.9–4.4)
Alpha 1: 0.2 g/dL (ref 0.0–0.4)
Alpha 2: 0.8 g/dL (ref 0.4–1.0)
Beta: 1.1 g/dL (ref 0.7–1.3)
Gamma Globulin: 0.9 g/dL (ref 0.4–1.8)
Globulin, Total: 3 g/dL (ref 2.2–3.9)

## 2023-09-11 LAB — ALPHA-GAL PANEL
Allergen Lamb IgE: <0.1 kU/L
Beef IgE: <0.1 kU/L
IgE (Immunoglobulin E), Serum: 13 [IU]/mL (ref 6–495)
O215-IgE Alpha-Gal: <0.1 kU/L
Pork IgE: <0.1 kU/L

## 2023-09-11 LAB — CHRONIC URTICARIA

## 2023-09-11 LAB — SEDIMENTATION RATE: Sed Rate: 3 mm/h (ref 0–30)

## 2023-09-11 LAB — PROTEIN ELECTROPHORESIS, URINE REFLEX
Albumin ELP, Urine: 100 %
Alpha-1-Globulin, U: 0 %
Alpha-2-Globulin, U: 0 %
Beta Globulin, U: 0 %
Gamma Globulin, U: 0 %
Protein, Ur: 5.3 mg/dL

## 2023-09-11 LAB — TRYPTASE: Tryptase: 10.4 ug/L (ref 2.2–13.2)

## 2023-09-11 LAB — C3 AND C4
Complement C3, Serum: 129 mg/dL (ref 82–167)
Complement C4, Serum: 24 mg/dL (ref 12–38)

## 2023-09-11 LAB — C-REACTIVE PROTEIN: CRP: <1 mg/L (ref 0–10)

## 2023-09-11 LAB — ANTINUCLEAR ANTIBODIES, IFA

## 2023-09-15 ENCOUNTER — Encounter: Payer: Self-pay | Admitting: Allergy

## 2023-09-17 ENCOUNTER — Other Ambulatory Visit: Payer: Self-pay | Admitting: Cardiology

## 2023-09-17 DIAGNOSIS — E1100 Type 2 diabetes mellitus with hyperosmolarity without nonketotic hyperglycemic-hyperosmolar coma (NKHHC): Secondary | ICD-10-CM

## 2023-09-26 ENCOUNTER — Other Ambulatory Visit: Payer: Self-pay | Admitting: Cardiology

## 2023-10-01 ENCOUNTER — Encounter: Payer: Self-pay | Admitting: Dermatology

## 2023-10-01 ENCOUNTER — Ambulatory Visit (INDEPENDENT_AMBULATORY_CARE_PROVIDER_SITE_OTHER): Payer: Medicare Other | Admitting: Dermatology

## 2023-10-01 VITALS — BP 123/79

## 2023-10-01 DIAGNOSIS — L503 Dermatographic urticaria: Secondary | ICD-10-CM

## 2023-10-01 NOTE — Patient Instructions (Addendum)
 Hello  Mr. Hindley,  Thank you for visiting today. Here is a summary of the key instructions:  - Medications:   - Take Allegra 180 mg twice a day (morning and night)   - Take famotidine twice a day (morning and night)   - Apply pimecrolimus cream twice a day (morning and evening)   - Mix pimecrolimus with CeraVe Anti-Itch cream  - Instructions:   - Take Allegra and famotidine together after breakfast and after dinner   - Apply creams after showering   - Use pimecrolimus cream every day   - Continue this treatment plan for 2-3 months  - Follow-up:   - See the allergist on the 15th of next month   - Follow-up appointment with me in June  - Additional Instructions:   - If you have any issues with the medications, stop taking them and call your doctor   - For any questions, call the office or send a message on MyChart  Please reach out if you have any questions or concerns.  Warm regards,  Dr. Langston Reusing Dermatology      Important Information  Due to recent changes in healthcare laws, you may see results of your pathology and/or laboratory studies on MyChart before the doctors have had a chance to review them. We understand that in some cases there may be results that are confusing or concerning to you. Please understand that not all results are received at the same time and often the doctors may need to interpret multiple results in order to provide you with the best plan of care or course of treatment. Therefore, we ask that you please give [place] 2 business days to thoroughly review all your results before contacting the office for clarification. Should we see a critical lab result, you will be contacted sooner.   If You Need Anything After Your Visit  If you have any questions or concerns for your doctor, please call our main line at [phone] If no one answers, please leave a voicemail as directed and we will return your call as soon as possible. Messages left  after 4 pm will be answered the following business day.   You may also send [place] a message via MyChart. We typically respond to MyChart messages within 1-2 business days.  For prescription refills, please ask your pharmacy to contact our office. Our fax number is [phone].  If you have an urgent issue when the clinic is closed that cannot wait until the next business day, you can page your doctor at the number below.    Please note that while we do our best to be available for urgent issues outside of office hours, we are not available 24/7.   If you have an urgent issue and are unable to reach [place], you may choose to seek medical care at your doctor's office, retail clinic, urgent care center, or emergency room.  If you have a medical emergency, please immediately call 911 or go to the emergency department. In the event of inclement weather, please call our main line at [phone] for an update on the status of any delays or closures.  Dermatology Medication Tips: Please keep the boxes that topical medications come in in order to help keep track of the instructions about where and how to use these. Pharmacies typically print the medication instructions only on the boxes and not directly on the medication tubes.   If your medication is too expensive, please contact our office at  [phone] or send us  a message through MyChart.   We are unable to tell what your co-pay for medications will be in advance as this is different depending on your insurance coverage. However, we may be able to find a substitute medication at lower cost or fill out paperwork to get insurance to cover a needed medication.   If a prior authorization is required to get your medication covered by your insurance company, please allow us  1-2 business days to complete this process.  Drug prices often vary depending on where the prescription is filled and some pharmacies may offer cheaper prices.  The website  www.goodrx.com contains coupons for medications through different pharmacies. The prices here do not account for what the cost may be with help from insurance (it may be cheaper with your insurance), but the website can give you the price if you did not use any insurance.  - You can print the associated coupon and take it with your prescription to the pharmacy.  - You may also stop by our office during regular business hours and pick up a GoodRx coupon card.  - If you need your prescription sent electronically to a different pharmacy, notify our office through [facility] or by phone at [phone]

## 2023-10-01 NOTE — Progress Notes (Signed)
 Follow-Up Visit   Subjective  Sean Kirby is a 68 y.o. male who presents for the following: dermatographic urticaria  Patient present today for follow up visit. Patient was last evaluated on 08/03/23. At this visit patient was prescribed pimecrolimus . Patient reports sxs are unchanged as he stated that he is still having a burning and itching sensation at night.    Sean Kirby reports that he discontinued sitting in his recliner and switched to a couch as previously recommended, but the burning sensation on his back persists, occurring consistently around 8 p.m. every night. He expresses hesitation about using the prescribed pimecrolimus  cream due to concerns about the lymphoma warning on the medication.  The patient was referred to an allergist, who prescribed famotidine . However, Sean Kirby has not started taking this medication due to fear. He is currently taking Allegra 180 mg once daily for his chronic hives. The patient reports that his diabetes is "not real good, but it comes and goes," indicating inconsistent control of his blood sugar levels.  Sean Kirby mentions receiving blood work results from the allergist but has not yet discussed them at a follow-up appointment. He demonstrates positive dermatographism during the visit, with his skin being very reactive to touch.  The following portions of the chart were reviewed this encounter and updated as appropriate: medications, allergies, medical history  Review of Systems:  No other skin or systemic complaints except as noted in HPI or Assessment and Plan.  Objective  Well appearing patient in no apparent distress; mood and affect are within normal limits.   A focused examination was performed of the following areas: back   Relevant exam findings are noted in the Assessment and Plan.     Assessment & Plan   Physical Urticaria (+ Dermatographism)  Exam: positive dermatographism on back  - Assessment: Patient  reports persistent hives and a burning sensation on his back, occurring nightly around 8 PM. Previous recommendation to discontinue lorazepam  did not resolve the issue. Positive dermatographism noted on examination, indicating highly reactive skin. Differential diagnoses include chronic spontaneous urticaria and chronic inducible urticaria. Recent allergy workup revealed normal thyroid  and allergy tests. Kidney function, liver function, and blood sugar levels were within acceptable ranges.  - Plan:    Increase Allegra (fexofenadine) to 180 mg PO BID as recommended by Allergist    Start famotidine  20 mg PO BID as recommended by allergist    Continue pimecrolimus  cream topically BID     - Mix with CeraVe Anti-Itch cream for application    Patient educated on medication regimen:     - Take Allegra and famotidine  together in the morning after breakfast and in the evening after dinner     - Apply creams after showering, either morning or night    Discussed potential benefits and safety profile of pimecrolimus , addressing patient's concerns about lymphoma risk    Follow up with allergist on the 15th of next month to discuss treatment efficacy and potential Xolair (omalizumab) therapy    Return to clinic in June for reassessment after allergist appointment  Pre-visit planning reviewing the last office visit, labs, imaging and care everywhere when applicable was 10 minutes Intra-visit was 25 minutes and included updating the relevant history, performing a video or in-person physical exam as appropriate, creating a treatment plan and used shared decision making with the patient.  Post-visit was 8 minutes that encompassed note completion, placing of orders, updating patient instructions and coordination of care.  Documentation: I have reviewed the above documentation for accuracy and completeness, and I agree with the above.  I, Shirron Louanne Roussel, CMA, am acting as scribe for [employer], DO.    Louana Roup, DO

## 2023-10-05 ENCOUNTER — Encounter: Payer: Self-pay | Admitting: Dermatology

## 2023-10-06 ENCOUNTER — Other Ambulatory Visit: Payer: Self-pay | Admitting: Family Medicine

## 2023-10-06 DIAGNOSIS — L819 Disorder of pigmentation, unspecified: Secondary | ICD-10-CM

## 2023-10-13 ENCOUNTER — Ambulatory Visit
Admission: RE | Admit: 2023-10-13 | Discharge: 2023-10-13 | Disposition: A | Discharge status: Home / Self Care | Source: Ambulatory Visit | Attending: Family Medicine | Admitting: Family Medicine

## 2023-10-13 ENCOUNTER — Other Ambulatory Visit: Payer: Self-pay | Admitting: Family Medicine

## 2023-10-13 DIAGNOSIS — L819 Disorder of pigmentation, unspecified: Secondary | ICD-10-CM

## 2023-10-28 ENCOUNTER — Ambulatory Visit (INDEPENDENT_AMBULATORY_CARE_PROVIDER_SITE_OTHER): Admitting: Allergy

## 2023-10-28 ENCOUNTER — Other Ambulatory Visit: Payer: Self-pay

## 2023-10-28 ENCOUNTER — Encounter: Payer: Self-pay | Admitting: Allergy

## 2023-10-28 VITALS — BP 136/88 | HR 80 | Temp 97.6°F | Resp 12 | Ht 68.11 in | Wt 160.0 lb

## 2023-10-28 DIAGNOSIS — L509 Urticaria, unspecified: Secondary | ICD-10-CM

## 2023-10-28 DIAGNOSIS — L508 Other urticaria: Secondary | ICD-10-CM

## 2023-10-28 MED ORDER — MONTELUKAST SODIUM 10 MG PO TABS: 10.0000 mg | ORAL_TABLET | Freq: Every day | ORAL | 5 refills | Status: AC

## 2023-10-28 NOTE — Progress Notes (Signed)
 Follow Up Note  RE: Sean Kirby MRN: [id number] DOB: Nov 07, 1955 Date of Office Visit: 10/28/2023  Referring provider: Roselind Congo, MD Primary care provider: Roselind Congo, MD  Chief Complaint: Urticaria (No concerns) and Pruritus  History of Present Illness: I had the pleasure of seeing Sean Kirby for a follow up visit at the [facility] on 10/28/2023. He is a 68 y.o. male, who is being followed for urticaria. His previous allergy office visit was on 09/02/2023 with Dr. Burdette Carolin. Today is a regular follow up visit.  Discussed the use of AI scribe software for clinical note transcription with the patient, who gave verbal consent to proceed.  He has been experiencing persistent hives and itching, which have remained unchanged since his last visit two months ago. The symptoms typically start in the evening and are somewhat alleviated by applying CeraVe. He has been taking Allegra 180 mg twice daily, but it has not helped reduce the symptoms.  2 days after starting famotidine  he noticed that his right toe turned black. Denies trauma to the area.  An ultrasound was performed to check circulation, and the toe has mostly returned to its normal color. He has since stopped taking famotidine  due to this concern.   He has a history of quadruple bypass surgery two years ago. His skin symptoms began after this surgery, during which an aluminum wrapper was used to reinforce his breastbone. He takes aspirin  daily and often feels chilly. No fevers or chills. He also reports generalized weakness on the day his toe turned black.  No headaches. Breathing has been good. The only itchy spot today is on his back, which he finds difficult to see without a mirror.   2025 labs: "Blood count, kidney function, liver function, electrolytes, thyroid , autoimmune screener, inflammation markers, chronic urticaria index (checks for autoantibodies that trigger mast cells), urine test  and alpha gal (checks for red meat allergy) were all normal which is great. Based on these results no underlying systemic issues found.    Your tryptase which checks for mast cell issues was 10 which is normal. However, sometimes I recommend to rule out hereditary alpha tryptasemia in patients who have levels above 8. Unfortunately this test is an out of pocket expense. We can discuss in more detail at the next visit."  10/01/2023 derm visit: "Physical Urticaria (+ Dermatographism)  Exam: positive dermatographism on back   - Assessment: Patient reports persistent hives and a burning sensation on his back, occurring nightly around 8 PM. Previous recommendation to discontinue lorazepam  did not resolve the issue. Positive dermatographism noted on examination, indicating highly reactive skin. Differential diagnoses include chronic spontaneous urticaria and chronic inducible urticaria. Recent allergy workup revealed normal thyroid  and allergy tests. Kidney function, liver function, and blood sugar levels were within acceptable ranges.   - Plan:    Increase Allegra (fexofenadine) to 180 mg PO BID as recommended by Allergist    Start famotidine  20 mg PO BID as recommended by allergist    Continue pimecrolimus  cream topically BID     - Mix with CeraVe Anti-Itch cream for application    Patient educated on medication regimen:     - Take Allegra and famotidine  together in the morning after breakfast and in the evening after dinner     - Apply creams after showering, either morning or night    Discussed potential benefits and safety profile of pimecrolimus , addressing patient's concerns about lymphoma risk    Follow  up with allergist on the 15th of next month to discuss treatment efficacy and potential Xolair (omalizumab) therapy    Return to clinic in June for reassessment after allergist appointment"  Assessment and Plan: [name] is a 68 y.o. male with: Physical urticaria Urticaria Past history -  daily x 2 yrs mainly pressure induced in the back. Currently on allegra once a day with minimal benefit. H/o hives in college during stress. 2025 labs unremarkable except for tryptase 10.4. Interim history - concerned if famotidine  caused his toe to turn black (unlikely). Symptoms unchanged with allegra 180mg  BID dose.  Start Singulair (montelukast) 10mg  daily at night. Cautioned that in some children/adults can experience behavioral changes including hyperactivity, agitation, depression, sleep disturbances and suicidal ideations. These side effects are rare, but if you notice them you should notify me and discontinue Singulair (montelukast). Allegra (fexofenadine) 180mg  twice a day. If symptoms are not controlled or causes drowsiness let us  know. Avoid the following potential triggers: alcohol, tight clothing, NSAIDs, hot showers and getting overheated. Continue proper skin care.  Consider Xolair if above regimen does not control symptoms.  Consider genetic testing to rule out hereditary alpha tryptasemia. Your tryptase which checks for mast cell issues was 10.4 which is normal. However, sometimes I recommend to rule out hereditary alpha tryptasemia in patients who have levels above 8. Unfortunately this test is an out of pocket expense.  https://www.genebygene.com/service/tryptase  Return in about 3 months (around 01/28/2024).  Meds ordered this encounter  Medications   montelukast (SINGULAIR) 10 MG tablet    Sig: Take 1 tablet (10 mg total) by mouth at bedtime.    Dispense:  30 tablet    Refill:  5   Lab Orders  No laboratory test(s) ordered today    Diagnostics: None.   Medication List:  Current Outpatient Medications  Medication Sig Dispense Refill   acetaminophen  (TYLENOL ) 500 MG tablet Take 1,000 mg by mouth every 6 (six) hours as needed (for pain.).     ALPHAGAN P 0.1 % SOLN      amLODipine (NORVASC) 5 MG tablet Take 2.5 mg by mouth at bedtime.     aspirin  81 MG EC tablet  Take 81 mg by mouth in the morning.     dorzolamide (TRUSOPT) 2 % ophthalmic solution 1 drop 2 (two) times daily.     fexofenadine (ALLEGRA) 60 MG tablet Take 60 mg by mouth 2 (two) times daily.     glucose blood test strip 1 each by Other route as needed. Use as instructed     latanoprost  (XALATAN ) 0.005 % ophthalmic solution Place 1 drop into both eyes at bedtime.     lisinopril  (ZESTRIL ) 2.5 MG tablet Take 1 tablet (2.5 mg total) by mouth daily. 30 tablet 5   LORazepam  (ATIVAN ) 0.5 MG tablet Take 0.5 mg by mouth daily as needed for anxiety.     metFORMIN (GLUCOPHAGE) 850 MG tablet Take 850 mg by mouth 2 (two) times daily with a meal.     montelukast (SINGULAIR) 10 MG tablet Take 1 tablet (10 mg total) by mouth at bedtime. 30 tablet 5   Multiple Vitamin (MULTIVITAMIN WITH MINERALS) TABS Take 2 tablets by mouth in the morning. gummy     polyethylene glycol (MIRALAX  / GLYCOLAX ) 17 g packet Take 17 g by mouth daily as needed (constipation).     rosuvastatin  (CRESTOR ) 20 MG tablet Take 1 tablet (20 mg total) by mouth daily. 90 tablet 2   RYBELSUS  7 MG TABS TAKE 1  TABLET(7 MG) BY MOUTH EVERY MORNING 30 tablet 5   No current facility-administered medications for this visit.   Allergies: Allergies  Allergen Reactions   Codeine     Patient unsure of reaction. (Familial reaction ONLY patient mother)   Septra [Sulfamethoxazole-Trimethoprim]     Patient unsure of reaction.   Dapagliflozin Other (See Comments)    (Farxiga) Dizziness & low BP   Sertraline Hcl Other (See Comments)    diarrhea   Glimepiride Other (See Comments)    Sugar bottoms out   Simvastatin Diarrhea   I reviewed his past medical history, social history, family history, and environmental history and no significant changes have been reported from his previous visit.  Review of Systems  Constitutional:  Positive for chills. Negative for appetite change, fever and unexpected weight change.  HENT:  Negative for congestion and  rhinorrhea.   Eyes:  Negative for itching.  Respiratory:  Negative for cough, chest tightness, shortness of breath and wheezing.   Cardiovascular:  Negative for chest pain.  Gastrointestinal:  Negative for abdominal pain.  Genitourinary:  Negative for difficulty urinating.  Skin:  Positive for rash.  Neurological:  Negative for headaches.    Objective: BP 136/88   Pulse 80   Temp 97.6 F (36.4 C)   Resp 12   Ht 5' 8.11" (1.73 m)   Wt 160 lb (72.6 kg)   SpO2 98%   BMI 24.25 kg/m  Body mass index is 24.25 kg/m. Physical Exam Vitals and nursing note reviewed.  Constitutional:      Appearance: Normal appearance. He is well-developed.  HENT:     Head: Normocephalic and atraumatic.     Right Ear: Tympanic membrane and external ear normal.     Left Ear: Tympanic membrane and external ear normal.     Nose: Nose normal.     Mouth/Throat:     Mouth: Mucous membranes are moist.     Pharynx: Oropharynx is clear.  Eyes:     Conjunctiva/sclera: Conjunctivae normal.  Cardiovascular:     Rate and Rhythm: Normal rate and regular rhythm.     Heart sounds: Normal heart sounds. No murmur heard.    No friction rub. No gallop.  Pulmonary:     Effort: Pulmonary effort is normal.     Breath sounds: Normal breath sounds. No wheezing, rhonchi or rales.  Musculoskeletal:     Cervical back: Neck supple.  Skin:    General: Skin is warm.     Findings: No rash.  Neurological:     Mental Status: He is alert and oriented to person, place, and time.  Psychiatric:        Behavior: Behavior normal.    Previous notes and tests were reviewed. The plan was reviewed with the patient/family, and all questions/concerned were addressed.  It was my pleasure to see Brandonmichael today and participate in his care. Please feel free to contact me with any questions or concerns.  Sincerely,  Eudelia Hero, DO Allergy & Immunology  [facility]  [facility] office: [phone] [facility] office: [phone]

## 2023-10-28 NOTE — Patient Instructions (Addendum)
 Start Singulair (montelukast) 10mg  daily at night. Cautioned that in some children/adults can experience behavioral changes including hyperactivity, agitation, depression, sleep disturbances and suicidal ideations. These side effects are rare, but if you notice them you should notify me and discontinue Singulair (montelukast).  Allegra (fexofenadine) 180mg  twice a day. If symptoms are not controlled or causes drowsiness let us  know. Avoid the following potential triggers: alcohol, tight clothing, NSAIDs, hot showers and getting overheated. Continue proper skin care.   Consider genetic testing to rule out hereditary alpha tryptasemia. Your tryptase which checks for mast cell issues was 10 which is normal. However, sometimes I recommend to rule out hereditary alpha tryptasemia in patients who have levels above 8. Unfortunately this test is an out of pocket expense.  https://www.genebygene.com/service/tryptase  Return in about 3 months (around 01/28/2024). Or sooner if needed.  Skin care recommendations  Bath time: Always use lukewarm water. AVOID very hot or cold water. Keep bathing time to 5-10 minutes. Do NOT use bubble bath. Use a mild soap and use just enough to wash the dirty areas. Do NOT scrub skin vigorously.  After bathing, pat dry your skin with a towel. Do NOT rub or scrub the skin.  Moisturizers and prescriptions:  ALWAYS apply moisturizers immediately after bathing (within 3 minutes). This helps to lock-in moisture. Use the moisturizer several times a day over the whole body. Good summer moisturizers include: Aveeno, CeraVe, Cetaphil. Good winter moisturizers include: Aquaphor, Vaseline, Cerave, Cetaphil, Eucerin, Vanicream. When using moisturizers along with medications, the moisturizer should be applied about one hour after applying the medication to prevent diluting effect of the medication or moisturize around where you applied the medications. When not using medications,  the moisturizer can be continued twice daily as maintenance.  Laundry and clothing: Avoid laundry products with added color or perfumes. Use unscented hypo-allergenic laundry products such as Tide free, Cheer free & gentle, and All free and clear.  If the skin still seems dry or sensitive, you can try double-rinsing the clothes. Avoid tight or scratchy clothing such as wool. Do not use fabric softeners or dyer sheets.

## 2023-12-01 ENCOUNTER — Encounter: Payer: Self-pay | Admitting: Dermatology

## 2023-12-02 ENCOUNTER — Ambulatory Visit: Admitting: Dermatology

## 2024-01-27 ENCOUNTER — Ambulatory Visit: Admitting: Allergy

## 2024-01-27 ENCOUNTER — Other Ambulatory Visit: Payer: Self-pay

## 2024-01-27 ENCOUNTER — Encounter: Payer: Self-pay | Admitting: Allergy

## 2024-01-27 DIAGNOSIS — L509 Urticaria, unspecified: Secondary | ICD-10-CM

## 2024-01-27 NOTE — Progress Notes (Signed)
 Follow Up Note  RE: Sean Kirby MRN: [id number] DOB: [birth year] Date of Office Visit: 01/27/2024  Referring provider: Arloa Elsie SAUNDERS, MD Primary care provider: Arloa Elsie SAUNDERS, MD  Chief Complaint: Urticaria (Dealing with a current flare - shoulder and lower back has been using allegra )  History of Present Illness: I had the pleasure of seeing Sean Kirby for a follow up visit at the [facility] on 01/27/2024. He is a 68 y.o. male, who is being followed for urticaria. His previous allergy office visit was on 10/28/2023 with Dr. Luke. Today is a regular follow up visit.  Discussed the use of AI scribe software for clinical note transcription with the patient, who gave verbal consent to proceed.    He has been experiencing recurrent hives for several months, primarily located on his back. The hives are exacerbated by pressure, affecting his comfort when sitting. No recent changes in his condition, and no fevers, chills, or changes in appetite.  He manages his symptoms with oatmeal baths, which he prepares by grinding oats and adding them to his bath water until it reaches a milky consistency. Sitting in the bath for fifteen minutes daily helps soothe his skin.  He uses Allegra (fexofenadine) to manage his symptoms, initially taking it twice a day but recently reduced the frequency to observe the effects. He resumed taking 180 mg of Allegra this morning after experiencing itchiness. Additionally, he uses a cortisone cream to quickly alleviate itching when it occurs.  He has not started taking montelukast  (Singulair ) due to concerns about potential side effects, particularly after his heart surgery in 2023. He is apprehensive about medications affecting his mental health.     Assessment and Plan: Sean Kirby is a 68 y.o. male with: Physical urticaria Urticaria Past history - daily x 2 yrs mainly pressure induced in the back. Currently on allegra once a day  with minimal benefit. H/o hives in college during stress. 2025 labs unremarkable except for tryptase 10.4. Interim history - didn't take Singulair  due to concerns about side effects. Still having issues with his back but not bad enough to do Xolair.  May take Allegra (fexofenadine) 180mg  once or twice a day. If symptoms are not controlled or causes drowsiness let us  know. Avoid the following potential triggers: alcohol, tight clothing, NSAIDs, hot showers and getting overheated. Continue proper skin care.    Consider genetic testing to rule out hereditary alpha tryptasemia. Your tryptase which checks for mast cell issues was 10.4 which is normal. However, sometimes I recommend to rule out hereditary alpha tryptasemia in patients who have levels above 8. Unfortunately this test is an out of pocket expense.  https://www.genebygene.com/service/tryptase  Return in about 6 months (around 07/29/2024).  No orders of the defined types were placed in this encounter.  Lab Orders  No laboratory test(s) ordered today    Diagnostics: None.   Medication List:  Current Outpatient Medications  Medication Sig Dispense Refill   acetaminophen  (TYLENOL ) 500 MG tablet Take 1,000 mg by mouth every 6 (six) hours as needed (for pain.).     ALPHAGAN P 0.1 % SOLN      amLODipine (NORVASC) 5 MG tablet Take 2.5 mg by mouth at bedtime.     aspirin  81 MG EC tablet Take 81 mg by mouth in the morning.     dorzolamide (TRUSOPT) 2 % ophthalmic solution 1 drop 2 (two) times daily.     fexofenadine (ALLEGRA) 60 MG tablet Take 60 mg  by mouth 2 (two) times daily.     glucose blood test strip 1 each by Other route as needed. Use as instructed     latanoprost  (XALATAN ) 0.005 % ophthalmic solution Place 1 drop into both eyes at bedtime.     lisinopril  (ZESTRIL ) 2.5 MG tablet Take 1 tablet (2.5 mg total) by mouth daily. 30 tablet 5   LORazepam  (ATIVAN ) 0.5 MG tablet Take 0.5 mg by mouth daily as needed for anxiety.      metFORMIN (GLUCOPHAGE) 850 MG tablet Take 850 mg by mouth 2 (two) times daily with a meal.     montelukast  (SINGULAIR ) 10 MG tablet Take 1 tablet (10 mg total) by mouth at bedtime. 30 tablet 5   Multiple Vitamin (MULTIVITAMIN WITH MINERALS) TABS Take 2 tablets by mouth in the morning. gummy     polyethylene glycol (MIRALAX  / GLYCOLAX ) 17 g packet Take 17 g by mouth daily as needed (constipation).     rosuvastatin  (CRESTOR ) 20 MG tablet Take 1 tablet (20 mg total) by mouth daily. 90 tablet 2   RYBELSUS  7 MG TABS TAKE 1 TABLET(7 MG) BY MOUTH EVERY MORNING 30 tablet 5   No current facility-administered medications for this visit.   Allergies: Allergies  Allergen Reactions   Codeine     Patient unsure of reaction. (Familial reaction ONLY patient mother)   Septra [Sulfamethoxazole-Trimethoprim]     Patient unsure of reaction.   Dapagliflozin Other (See Comments)    (Farxiga) Dizziness & low BP   Sertraline Hcl Other (See Comments)    diarrhea   Glimepiride Other (See Comments)    Sugar bottoms out   Simvastatin Diarrhea   I reviewed his past medical history, social history, family history, and environmental history and no significant changes have been reported from his previous visit.  Review of Systems  Constitutional:  Negative for appetite change, chills, fever and unexpected weight change.  HENT:  Negative for congestion and rhinorrhea.   Eyes:  Negative for itching.  Respiratory:  Negative for cough, chest tightness, shortness of breath and wheezing.   Cardiovascular:  Negative for chest pain.  Gastrointestinal:  Negative for abdominal pain.  Genitourinary:  Negative for difficulty urinating.  Skin:  Positive for rash.  Neurological:  Negative for headaches.    Objective: There were no vitals taken for this visit. There is no height or weight on file to calculate BMI. Physical Exam Vitals and nursing note reviewed.  Constitutional:      Appearance: Normal appearance. He is  well-developed.  HENT:     Head: Normocephalic and atraumatic.     Right Ear: Tympanic membrane and external ear normal.     Left Ear: Tympanic membrane and external ear normal.     Nose: Nose normal.     Mouth/Throat:     Mouth: Mucous membranes are moist.     Pharynx: Oropharynx is clear.  Eyes:     Conjunctiva/sclera: Conjunctivae normal.  Cardiovascular:     Rate and Rhythm: Normal rate and regular rhythm.     Heart sounds: Normal heart sounds. No murmur heard.    No friction rub. No gallop.  Pulmonary:     Effort: Pulmonary effort is normal.     Breath sounds: Normal breath sounds. No wheezing, rhonchi or rales.  Musculoskeletal:     Cervical back: Neck supple.  Skin:    General: Skin is warm.     Findings: No rash.  Neurological:     Mental Status: He  is alert and oriented to person, place, and time.  Psychiatric:        Behavior: Behavior normal.    Previous notes and tests were reviewed. The plan was reviewed with the patient/family, and all questions/concerned were addressed.  It was my pleasure to see Sean Kirby today and participate in his care. Please feel free to contact me with any questions or concerns.  Sincerely,  Orlan Cramp, DO Allergy & Immunology  [facility]  [place] office: [phone] [facility] office: [phone]

## 2024-01-27 NOTE — Patient Instructions (Addendum)
 May take Allegra (fexofenadine) 180mg  once or twice a day. If symptoms are not controlled or causes drowsiness let us  know. Avoid the following potential triggers: alcohol, tight clothing, NSAIDs, hot showers and getting overheated. Continue proper skin care.   Consider genetic testing to rule out hereditary alpha tryptasemia. Your tryptase which checks for mast cell issues was 10 which is normal. However, sometimes I recommend to rule out hereditary alpha tryptasemia in patients who have levels above 8. Unfortunately this test is an out of pocket expense.  [email or web address]  Return in about 6 months (around 07/29/2024). Or sooner if needed.  Skin care recommendations  Bath time: Always use lukewarm water. AVOID very hot or cold water. Keep bathing time to 5-10 minutes. Do NOT use bubble bath. Use a mild soap and use just enough to wash the dirty areas. Do NOT scrub skin vigorously.  After bathing, pat dry your skin with a towel. Do NOT rub or scrub the skin.  Moisturizers and prescriptions:  ALWAYS apply moisturizers immediately after bathing (within 3 minutes). This helps to lock-in moisture. Use the moisturizer several times a day over the whole body. Good summer moisturizers include: Aveeno, CeraVe, Cetaphil. Good winter moisturizers include: Aquaphor, Vaseline, Cerave, Cetaphil, Eucerin, Vanicream. When using moisturizers along with medications, the moisturizer should be applied about one hour after applying the medication to prevent diluting effect of the medication or moisturize around where you applied the medications. When not using medications, the moisturizer can be continued twice daily as maintenance.  Laundry and clothing: Avoid laundry products with added color or perfumes. Use unscented hypo-allergenic laundry products such as Tide free, Cheer free & gentle, and All free and clear.  If the skin still seems dry or sensitive, you can try  double-rinsing the clothes. Avoid tight or scratchy clothing such as wool. Do not use fabric softeners or dyer sheets.

## 2024-03-21 ENCOUNTER — Other Ambulatory Visit: Payer: Self-pay | Admitting: Cardiology

## 2024-03-21 DIAGNOSIS — E1100 Type 2 diabetes mellitus with hyperosmolarity without nonketotic hyperglycemic-hyperosmolar coma (NKHHC): Secondary | ICD-10-CM

## 2024-08-01 ENCOUNTER — Ambulatory Visit: Admitting: Allergy
